# Patient Record
Sex: Female | Born: 1985 | Race: White | Hispanic: No | Smoking: Never smoker
Health system: Southern US, Community
[De-identification: ages and names within clinical notes are randomized; demographics above are authoritative.]

## PROBLEM LIST (undated history)

## (undated) DIAGNOSIS — F32A Depression, unspecified: Secondary | ICD-10-CM

## (undated) DIAGNOSIS — R197 Diarrhea, unspecified: Secondary | ICD-10-CM

## (undated) DIAGNOSIS — F329 Major depressive disorder, single episode, unspecified: Secondary | ICD-10-CM

## (undated) DIAGNOSIS — F419 Anxiety disorder, unspecified: Secondary | ICD-10-CM

## (undated) DIAGNOSIS — E782 Mixed hyperlipidemia: Secondary | ICD-10-CM

## (undated) DIAGNOSIS — J069 Acute upper respiratory infection, unspecified: Secondary | ICD-10-CM

## (undated) DIAGNOSIS — J45909 Unspecified asthma, uncomplicated: Secondary | ICD-10-CM

## (undated) DIAGNOSIS — E041 Nontoxic single thyroid nodule: Secondary | ICD-10-CM

## (undated) HISTORY — DX: Major depressive disorder, single episode, unspecified: F32.9

## (undated) HISTORY — PX: ANKLE SURGERY: SHX546

## (undated) HISTORY — DX: Depression, unspecified: F32.A

## (undated) HISTORY — DX: Nontoxic single thyroid nodule: E04.1

## (undated) HISTORY — DX: Mixed hyperlipidemia: E78.2

## (undated) HISTORY — DX: Acute upper respiratory infection, unspecified: J06.9

## (undated) HISTORY — DX: Anxiety disorder, unspecified: F41.9

## (undated) HISTORY — PX: WISDOM TOOTH EXTRACTION: SHX21

## (undated) HISTORY — PX: SINOSCOPY: SHX187

## (undated) HISTORY — DX: Diarrhea, unspecified: R19.7

---

## 2011-01-06 DIAGNOSIS — F329 Major depressive disorder, single episode, unspecified: Secondary | ICD-10-CM | POA: Insufficient documentation

## 2011-01-06 DIAGNOSIS — F32A Depression, unspecified: Secondary | ICD-10-CM | POA: Insufficient documentation

## 2011-01-06 DIAGNOSIS — E063 Autoimmune thyroiditis: Secondary | ICD-10-CM | POA: Insufficient documentation

## 2011-02-01 DIAGNOSIS — B009 Herpesviral infection, unspecified: Secondary | ICD-10-CM | POA: Insufficient documentation

## 2012-12-19 DIAGNOSIS — G4700 Insomnia, unspecified: Secondary | ICD-10-CM | POA: Insufficient documentation

## 2012-12-19 DIAGNOSIS — K589 Irritable bowel syndrome without diarrhea: Secondary | ICD-10-CM | POA: Insufficient documentation

## 2012-12-19 DIAGNOSIS — Z8659 Personal history of other mental and behavioral disorders: Secondary | ICD-10-CM | POA: Insufficient documentation

## 2014-11-09 ENCOUNTER — Encounter (HOSPITAL_BASED_OUTPATIENT_CLINIC_OR_DEPARTMENT_OTHER): Payer: Self-pay | Admitting: Emergency Medicine

## 2014-11-09 ENCOUNTER — Inpatient Hospital Stay (HOSPITAL_BASED_OUTPATIENT_CLINIC_OR_DEPARTMENT_OTHER)
Admission: EM | Admit: 2014-11-09 | Discharge: 2014-11-12 | DRG: 194 | Disposition: A | Discharge status: Home / Self Care | Payer: Managed Care, Other (non HMO) | Attending: Internal Medicine | Admitting: Internal Medicine

## 2014-11-09 ENCOUNTER — Emergency Department (HOSPITAL_BASED_OUTPATIENT_CLINIC_OR_DEPARTMENT_OTHER): Payer: Managed Care, Other (non HMO)

## 2014-11-09 DIAGNOSIS — F419 Anxiety disorder, unspecified: Secondary | ICD-10-CM | POA: Diagnosis present

## 2014-11-09 DIAGNOSIS — J45901 Unspecified asthma with (acute) exacerbation: Secondary | ICD-10-CM | POA: Diagnosis present

## 2014-11-09 DIAGNOSIS — E039 Hypothyroidism, unspecified: Secondary | ICD-10-CM | POA: Diagnosis present

## 2014-11-09 DIAGNOSIS — Z91018 Allergy to other foods: Secondary | ICD-10-CM

## 2014-11-09 DIAGNOSIS — J45909 Unspecified asthma, uncomplicated: Secondary | ICD-10-CM | POA: Diagnosis present

## 2014-11-09 DIAGNOSIS — J189 Pneumonia, unspecified organism: Principal | ICD-10-CM | POA: Diagnosis present

## 2014-11-09 DIAGNOSIS — K219 Gastro-esophageal reflux disease without esophagitis: Secondary | ICD-10-CM | POA: Diagnosis present

## 2014-11-09 DIAGNOSIS — J329 Chronic sinusitis, unspecified: Secondary | ICD-10-CM | POA: Diagnosis present

## 2014-11-09 DIAGNOSIS — E876 Hypokalemia: Secondary | ICD-10-CM | POA: Diagnosis present

## 2014-11-09 DIAGNOSIS — J45902 Unspecified asthma with status asthmaticus: Secondary | ICD-10-CM | POA: Diagnosis present

## 2014-11-09 DIAGNOSIS — R000 Tachycardia, unspecified: Secondary | ICD-10-CM | POA: Diagnosis present

## 2014-11-09 DIAGNOSIS — F329 Major depressive disorder, single episode, unspecified: Secondary | ICD-10-CM | POA: Diagnosis present

## 2014-11-09 HISTORY — DX: Unspecified asthma, uncomplicated: J45.909

## 2014-11-09 LAB — BASIC METABOLIC PANEL
Anion gap: 10 (ref 5–15)
BUN: 18 mg/dL (ref 6–20)
CALCIUM: 9.1 mg/dL (ref 8.9–10.3)
CO2: 21 mmol/L — ABNORMAL LOW (ref 22–32)
CREATININE: 1.14 mg/dL — AB (ref 0.44–1.00)
Chloride: 109 mmol/L (ref 101–111)
GFR calc Af Amer: >60 mL/min (ref >60)
GLUCOSE: 104 mg/dL — AB (ref 65–99)
Potassium: 3.4 mmol/L — ABNORMAL LOW (ref 3.5–5.1)
Sodium: 140 mmol/L (ref 135–145)

## 2014-11-09 LAB — CBC WITH DIFFERENTIAL/PLATELET
Basophils Absolute: 0.1 10*3/uL (ref 0.0–0.1)
Basophils Relative: 1 % (ref 0–1)
EOS ABS: 1 10*3/uL — AB (ref 0.0–0.7)
EOS PCT: 11 % — AB (ref 0–5)
HCT: 39.3 % (ref 36.0–46.0)
Hemoglobin: 13.1 g/dL (ref 12.0–15.0)
LYMPHS ABS: 2.5 10*3/uL (ref 0.7–4.0)
LYMPHS PCT: 28 % (ref 12–46)
MCH: 29.6 pg (ref 26.0–34.0)
MCHC: 33.3 g/dL (ref 30.0–36.0)
MCV: 88.9 fL (ref 78.0–100.0)
MONO ABS: 0.6 10*3/uL (ref 0.1–1.0)
Monocytes Relative: 6 % (ref 3–12)
Neutro Abs: 5 10*3/uL (ref 1.7–7.7)
Neutrophils Relative %: 54 % (ref 43–77)
PLATELETS: 266 10*3/uL (ref 150–400)
RBC: 4.42 MIL/uL (ref 3.87–5.11)
RDW: 12.9 % (ref 11.5–15.5)
WBC: 9.2 10*3/uL (ref 4.0–10.5)

## 2014-11-09 LAB — D-DIMER, QUANTITATIVE: D-Dimer, Quant: 0.44 ug/mL-FEU (ref 0.00–0.48)

## 2014-11-09 LAB — URINALYSIS, ROUTINE W REFLEX MICROSCOPIC
BILIRUBIN URINE: NEGATIVE
Glucose, UA: NEGATIVE mg/dL
HGB URINE DIPSTICK: NEGATIVE
KETONES UR: 15 mg/dL — AB
Leukocytes, UA: NEGATIVE
Nitrite: NEGATIVE
PH: 6.5 (ref 5.0–8.0)
Protein, ur: NEGATIVE mg/dL
SPECIFIC GRAVITY, URINE: 1.028 (ref 1.005–1.030)
UROBILINOGEN UA: 0.2 mg/dL (ref 0.0–1.0)

## 2014-11-09 LAB — PREGNANCY, URINE: Preg Test, Ur: NEGATIVE

## 2014-11-09 MED ORDER — IPRATROPIUM BROMIDE 0.02 % IN SOLN
0.5000 mg | Freq: Once | RESPIRATORY_TRACT | Status: AC
  Administered 2014-11-09: 0.5 mg via RESPIRATORY_TRACT
  Filled 2014-11-09: qty 2.5

## 2014-11-09 MED ORDER — ALBUTEROL (5 MG/ML) CONTINUOUS INHALATION SOLN
10.0000 mg/h | INHALATION_SOLUTION | RESPIRATORY_TRACT | Status: AC
Start: 2014-11-09 — End: 2014-11-09
  Administered 2014-11-09: 10 mg/h via RESPIRATORY_TRACT
  Filled 2014-11-09: qty 20

## 2014-11-09 MED ORDER — MAGNESIUM SULFATE 2 GM/50ML IV SOLN
2.0000 g | Freq: Once | INTRAVENOUS | Status: AC
  Administered 2014-11-09: 2 g via INTRAVENOUS
  Filled 2014-11-09: qty 50

## 2014-11-09 MED ORDER — PREDNISONE 50 MG PO TABS
60.0000 mg | ORAL_TABLET | Freq: Once | ORAL | Status: AC
  Administered 2014-11-09: 60 mg via ORAL
  Filled 2014-11-09 (×2): qty 1

## 2014-11-09 NOTE — ED Provider Notes (Signed)
CSN: [id number]     Arrival date & time 11/09/14  5465 History  This chart was scribed for Ezequiel Essex, MD by Meriel Pica, ED Scribe. This patient was seen in room MH10/MH10 and the patient's care was started 8:24 PM.   Chief Complaint  Patient presents with  . Cough   HPI HPI Comments: Meagan Chung is a 29 y.o. female who presents to the Emergency Department complaining of gradually worsening, constant SOB onset 3 weeks that progressively worsened 1 day ago. She has been evaluated several times for the same complaint of SOB by her PCP Dr. Jeannine Kitten. At PCP office pt has had chest Xray and was diagnosed with PNA and a sinusitis. Over the course of her symptoms she has been prescribed prednisone, which she has finished, cefdinir, doxycycline, and received a rocephin injection 2 days ago. She reports associated chest tightness, low grade fevers, a productive cough, lower abdominal pain and bilateral calf cramping. Current temp is 98.27F. Pt has used a home nebulizer PRN that has provided mild, brief relief of SOB. She has a PMhx of asthma but does not use a breathing treatment daily at baseline but takes dulera daily. She reports use of the nebulizer treatment 5 times in the past hour due to worsening SOB. She notes she has had improvement of asthma symptoms in the past with prednisone, however recent prednisone course did not provide relief. Pt traveled to [place] several months ago. Denies CP, dysuria, hematuria, taking BC medication, or chance of pregnancy.   Past Medical History  Diagnosis Date  . Asthma    Past Surgical History  Procedure Laterality Date  . Ankle surgery     History reviewed. No pertinent family history. Social History  Substance Use Topics  . Smoking status: Never Smoker   . Smokeless tobacco: None  . Alcohol Use: Yes     Comment: rarely   OB History    No data available     Review of Systems  Constitutional: Positive for fatigue.  Respiratory: Positive for  cough, chest tightness and shortness of breath.   Cardiovascular: Negative for chest pain.  Genitourinary: Negative for dysuria and hematuria.  A complete 10 system review of systems was obtained and is otherwise negative except at noted in the HPI and PMH.  Allergies  Review of patient's allergies indicates no known allergies.  Home Medications   Prior to Admission medications   Not on File   BP 119/83 mmHg  Pulse 107  Temp(Src) 98.4 F (36.9 C) (Oral)  Resp 26  SpO2 94%  LMP 10/02/2014 Physical Exam  Constitutional: She is oriented to person, place, and time. She appears well-developed and well-nourished. No distress.  HENT:  Head: Normocephalic and atraumatic.  Mouth/Throat: Oropharynx is clear and moist. No oropharyngeal exudate.  Eyes: Conjunctivae and EOM are normal. Pupils are equal, round, and reactive to light.  Neck: Normal range of motion. Neck supple.  No meningismus.  Cardiovascular: Regular rhythm, normal heart sounds and intact distal pulses.   No murmur heard. Tachycardic.   Pulmonary/Chest: She has wheezes.  Tachypneic; good airflow with faint expiratory wheezing   Abdominal: Soft. There is no tenderness. There is no rebound and no guarding.  Musculoskeletal: Normal range of motion. She exhibits no edema or tenderness.  Neurological: She is alert and oriented to person, place, and time. No cranial nerve deficit. She exhibits normal muscle tone. Coordination normal.  No ataxia on finger to nose bilaterally. No pronator drift. 5/5 strength throughout. CN  2-12 intact. Negative Romberg. Equal grip strength. Sensation intact. Gait is normal.   Skin: Skin is warm.  Psychiatric: She has a normal mood and affect. Her behavior is normal.  Nursing note and vitals reviewed.   ED Course  Procedures  DIAGNOSTIC STUDIES: Oxygen Saturation is 94% on mask, adequate by my interpretation.    COORDINATION OF CARE: 8:32 PM Discussed treatment plan with pt. Chest Xray  ordered. Pt acknowledges and agrees to plan.   Labs Review Labs Reviewed  URINALYSIS, ROUTINE W REFLEX MICROSCOPIC (NOT AT [facility]) - Abnormal; Notable for the following:    Color, Urine AMBER (*)    Ketones, ur 15 (*)    All other components within normal limits  CBC WITH DIFFERENTIAL/PLATELET - Abnormal; Notable for the following:    Eosinophils Relative 11 (*)    Eosinophils Absolute 1.0 (*)    All other components within normal limits  BASIC METABOLIC PANEL - Abnormal; Notable for the following:    Potassium 3.4 (*)    CO2 21 (*)    Glucose, Bld 104 (*)    Creatinine, Ser 1.14 (*)    All other components within normal limits  PREGNANCY, URINE  D-DIMER, QUANTITATIVE (NOT AT [facility])    Imaging Review Dg Chest 2 View  11/09/2014   CLINICAL DATA:  Cough. Pneumonia for several days. Shortness of breath.  EXAM: CHEST  2 VIEW  COMPARISON:  None.  FINDINGS: Cardiomediastinal silhouette is within normal limits. There is moderate central airway thickening with mildly asymmetric right infrahilar opacity. No pleural effusion or pneumothorax is seen. No acute osseous abnormality is identified.  IMPRESSION: Airway thickening which may reflect bronchitis or reactive airways disease. Slightly asymmetric right infrahilar opacity, with developing pneumonia in this region not excluded.   Electronically Signed   By: Logan Bores M.D.   On: 11/09/2014 20:46   I have personally reviewed and evaluated these images and lab results as part of my medical decision-making.   EKG Interpretation None      MDM   Final diagnoses:  Status asthmaticus, unspecified asthma severity   3 weeks of difficulty breathing, wheezing, shortness of breath. Treated by his PCP for pneumonia and sinusitis. Currently on doxycycline and Cefdinir.  Speaking in full sentences. No hypoxia. Scattered expiratory wheezing.  Nebulizers, steroids, chest x-ray.  Chest x-ray shows possible developing pneumonia in the right side. She  is already on antibiotics. D-dimer is negative. Rocephin and azithromycin given.  MOre wheezing after neb, tachypneic.  Not hypoxic with ambulation.   Remains dyspneic with conversation.  Tachycardic, likely from albuterol. D-dimer negative. Admission d/w Dr. Alcario Drought.  CRITICAL CARE Performed by: Ezequiel Essex Total critical care time: 35 Critical care time was exclusive of separately billable procedures and treating other patients. Critical care was necessary to treat or prevent imminent or life-threatening deterioration. Critical care was time spent personally by me on the following activities: development of treatment plan with patient and/or surrogate as well as nursing, discussions with consultants, evaluation of patient's response to treatment, examination of patient, obtaining history from patient or surrogate, ordering and performing treatments and interventions, ordering and review of laboratory studies, ordering and review of radiographic studies, pulse oximetry and re-evaluation of patient's condition.   I personally performed the services described in this documentation, which was scribed in my presence. The recorded information has been reviewed and is accurate.   Ezequiel Essex, MD 11/10/14 [phone]

## 2014-11-09 NOTE — ED Notes (Signed)
Patient reports that she is very SOB and has had to use breathing machine several times in the last few hours and can not catch her breath.  The patient has been dx with PNA and sinus infection.

## 2014-11-09 NOTE — ED Notes (Signed)
Ambulated patient per MD orders. Patient maintained saturations of 97-98% with HR 147. Patient able to walk with steady gait without difficulty.

## 2014-11-09 NOTE — ED Notes (Addendum)
Visual assessment - patient able to converse with no to minimal difficulty, speaking in sentences without pausing to catch her breath.  States h/o asthma and has been sob off and on for 3 weeks, with minimal relief from nebulizers at home.

## 2014-11-10 DIAGNOSIS — F329 Major depressive disorder, single episode, unspecified: Secondary | ICD-10-CM | POA: Diagnosis present

## 2014-11-10 DIAGNOSIS — J189 Pneumonia, unspecified organism: Secondary | ICD-10-CM | POA: Diagnosis present

## 2014-11-10 DIAGNOSIS — F419 Anxiety disorder, unspecified: Secondary | ICD-10-CM | POA: Diagnosis present

## 2014-11-10 DIAGNOSIS — E876 Hypokalemia: Secondary | ICD-10-CM | POA: Diagnosis present

## 2014-11-10 DIAGNOSIS — J45909 Unspecified asthma, uncomplicated: Secondary | ICD-10-CM | POA: Diagnosis present

## 2014-11-10 DIAGNOSIS — J45901 Unspecified asthma with (acute) exacerbation: Secondary | ICD-10-CM | POA: Diagnosis present

## 2014-11-10 DIAGNOSIS — J329 Chronic sinusitis, unspecified: Secondary | ICD-10-CM | POA: Diagnosis present

## 2014-11-10 DIAGNOSIS — E039 Hypothyroidism, unspecified: Secondary | ICD-10-CM | POA: Diagnosis present

## 2014-11-10 DIAGNOSIS — R000 Tachycardia, unspecified: Secondary | ICD-10-CM | POA: Diagnosis present

## 2014-11-10 DIAGNOSIS — Z91018 Allergy to other foods: Secondary | ICD-10-CM | POA: Diagnosis not present

## 2014-11-10 DIAGNOSIS — K219 Gastro-esophageal reflux disease without esophagitis: Secondary | ICD-10-CM | POA: Diagnosis present

## 2014-11-10 DIAGNOSIS — J45902 Unspecified asthma with status asthmaticus: Secondary | ICD-10-CM | POA: Diagnosis present

## 2014-11-10 LAB — BASIC METABOLIC PANEL
Anion gap: 9 (ref 5–15)
BUN: 9 mg/dL (ref 6–20)
CALCIUM: 9 mg/dL (ref 8.9–10.3)
CO2: 19 mmol/L — AB (ref 22–32)
CREATININE: 0.61 mg/dL (ref 0.44–1.00)
Chloride: 107 mmol/L (ref 101–111)
GFR calc non Af Amer: >60 mL/min (ref >60)
Glucose, Bld: 167 mg/dL — ABNORMAL HIGH (ref 65–99)
Potassium: 4 mmol/L (ref 3.5–5.1)
Sodium: 135 mmol/L (ref 135–145)

## 2014-11-10 LAB — EXPECTORATED SPUTUM ASSESSMENT W REFEX TO RESP CULTURE

## 2014-11-10 LAB — CBC
HCT: 38.9 % (ref 36.0–46.0)
Hemoglobin: 12.9 g/dL (ref 12.0–15.0)
MCH: 29.4 pg (ref 26.0–34.0)
MCHC: 33.2 g/dL (ref 30.0–36.0)
MCV: 88.6 fL (ref 78.0–100.0)
PLATELETS: 267 10*3/uL (ref 150–400)
RBC: 4.39 MIL/uL (ref 3.87–5.11)
RDW: 13.1 % (ref 11.5–15.5)
WBC: 7.6 10*3/uL (ref 4.0–10.5)

## 2014-11-10 LAB — TSH: TSH: 0.313 u[IU]/mL — ABNORMAL LOW (ref 0.350–4.500)

## 2014-11-10 LAB — STREP PNEUMONIAE URINARY ANTIGEN: STREP PNEUMO URINARY ANTIGEN: NEGATIVE

## 2014-11-10 LAB — EXPECTORATED SPUTUM ASSESSMENT W GRAM STAIN, RFLX TO RESP C

## 2014-11-10 LAB — MAGNESIUM: Magnesium: 2.2 mg/dL (ref 1.7–2.4)

## 2014-11-10 LAB — MRSA PCR SCREENING: MRSA BY PCR: NEGATIVE

## 2014-11-10 MED ORDER — METHYLPREDNISOLONE SODIUM SUCC 125 MG IJ SOLR
60.0000 mg | Freq: Two times a day (BID) | INTRAMUSCULAR | Status: DC
  Administered 2014-11-10: 60 mg via INTRAVENOUS
  Filled 2014-11-10: qty 2

## 2014-11-10 MED ORDER — LEVALBUTEROL HCL 0.63 MG/3ML IN NEBU: 0.6300 mg | INHALATION_SOLUTION | RESPIRATORY_TRACT | Status: DC | PRN

## 2014-11-10 MED ORDER — IPRATROPIUM BROMIDE 0.02 % IN SOLN: 0.5000 mg | RESPIRATORY_TRACT | Status: DC | PRN

## 2014-11-10 MED ORDER — METHYLPREDNISOLONE SODIUM SUCC 125 MG IJ SOLR
60.0000 mg | Freq: Once | INTRAMUSCULAR | Status: AC
  Administered 2014-11-10: 60 mg via INTRAVENOUS
  Filled 2014-11-10: qty 2

## 2014-11-10 MED ORDER — ALBUTEROL SULFATE (2.5 MG/3ML) 0.083% IN NEBU
2.5000 mg | INHALATION_SOLUTION | RESPIRATORY_TRACT | Status: DC | PRN
  Administered 2014-11-10: 2.5 mg via RESPIRATORY_TRACT
  Filled 2014-11-10: qty 3

## 2014-11-10 MED ORDER — METHYLPREDNISOLONE SODIUM SUCC 125 MG IJ SOLR
60.0000 mg | Freq: Three times a day (TID) | INTRAMUSCULAR | Status: DC
  Administered 2014-11-10 – 2014-11-12 (×6): 60 mg via INTRAVENOUS
  Filled 2014-11-10 (×6): qty 2

## 2014-11-10 MED ORDER — ONDANSETRON HCL 4 MG PO TABS: 4.0000 mg | ORAL_TABLET | Freq: Four times a day (QID) | ORAL | Status: DC | PRN

## 2014-11-10 MED ORDER — ACETAMINOPHEN 650 MG RE SUPP: 650.0000 mg | Freq: Four times a day (QID) | RECTAL | Status: DC | PRN

## 2014-11-10 MED ORDER — DEXTROSE 5 % IV SOLN
1.0000 g | Freq: Once | INTRAVENOUS | Status: AC
  Administered 2014-11-10: 1 g via INTRAVENOUS

## 2014-11-10 MED ORDER — DEXTROSE 5 % IV SOLN
1.0000 g | INTRAVENOUS | Status: DC
  Administered 2014-11-10: 1 g via INTRAVENOUS
  Filled 2014-11-10 (×2): qty 10

## 2014-11-10 MED ORDER — THYROID 48.75 MG PO TABS
48.7500 mg | ORAL_TABLET | Freq: Two times a day (BID) | ORAL | Status: DC
  Filled 2014-11-10 (×3): qty 1

## 2014-11-10 MED ORDER — CLONAZEPAM 0.5 MG PO TABS
0.5000 mg | ORAL_TABLET | Freq: Every day | ORAL | Status: DC
  Administered 2014-11-10 – 2014-11-11 (×2): 1 mg via ORAL
  Filled 2014-11-10 (×2): qty 2

## 2014-11-10 MED ORDER — ENOXAPARIN SODIUM 40 MG/0.4ML ~~LOC~~ SOLN
40.0000 mg | SUBCUTANEOUS | Status: DC
  Administered 2014-11-10 – 2014-11-11 (×2): 40 mg via SUBCUTANEOUS
  Filled 2014-11-10 (×2): qty 0.4

## 2014-11-10 MED ORDER — POLYETHYLENE GLYCOL 3350 17 G PO PACK: 17.0000 g | PACK | Freq: Every day | ORAL | Status: DC | PRN

## 2014-11-10 MED ORDER — CEFTRIAXONE SODIUM 1 G IJ SOLR
INTRAMUSCULAR | Status: AC
  Filled 2014-11-10: qty 10

## 2014-11-10 MED ORDER — AZITHROMYCIN 500 MG IV SOLR
INTRAVENOUS | Status: AC
  Filled 2014-11-10: qty 500

## 2014-11-10 MED ORDER — SODIUM CHLORIDE 0.9 % IJ SOLN
3.0000 mL | Freq: Two times a day (BID) | INTRAMUSCULAR | Status: DC
  Administered 2014-11-10 – 2014-11-11 (×3): 3 mL via INTRAVENOUS

## 2014-11-10 MED ORDER — ACETAMINOPHEN 325 MG PO TABS
650.0000 mg | ORAL_TABLET | Freq: Four times a day (QID) | ORAL | Status: DC | PRN
  Administered 2014-11-11: 650 mg via ORAL
  Filled 2014-11-10: qty 2

## 2014-11-10 MED ORDER — MOMETASONE FURO-FORMOTEROL FUM 200-5 MCG/ACT IN AERO
1.0000 | INHALATION_SPRAY | Freq: Two times a day (BID) | RESPIRATORY_TRACT | Status: DC
  Administered 2014-11-10 – 2014-11-11 (×3): 1 via RESPIRATORY_TRACT
  Filled 2014-11-10: qty 8.8

## 2014-11-10 MED ORDER — SODIUM CHLORIDE 0.9 % IV SOLN
INTRAVENOUS | Status: DC
  Administered 2014-11-10: 01:00:00 via INTRAVENOUS

## 2014-11-10 MED ORDER — CITALOPRAM HYDROBROMIDE 20 MG PO TABS
40.0000 mg | ORAL_TABLET | Freq: Every day | ORAL | Status: DC
  Administered 2014-11-10 – 2014-11-11 (×2): 40 mg via ORAL
  Filled 2014-11-10 (×2): qty 2

## 2014-11-10 MED ORDER — IPRATROPIUM-ALBUTEROL 0.5-2.5 (3) MG/3ML IN SOLN: 3.0000 mL | RESPIRATORY_TRACT | Status: DC | PRN

## 2014-11-10 MED ORDER — POTASSIUM CHLORIDE CRYS ER 20 MEQ PO TBCR
40.0000 meq | EXTENDED_RELEASE_TABLET | Freq: Once | ORAL | Status: AC
  Administered 2014-11-10: 40 meq via ORAL
  Filled 2014-11-10: qty 2

## 2014-11-10 MED ORDER — LEVALBUTEROL HCL 0.63 MG/3ML IN NEBU
0.6300 mg | INHALATION_SOLUTION | Freq: Four times a day (QID) | RESPIRATORY_TRACT | Status: DC
  Administered 2014-11-10 – 2014-11-12 (×7): 0.63 mg via RESPIRATORY_TRACT
  Filled 2014-11-10 (×8): qty 3

## 2014-11-10 MED ORDER — CLONAZEPAM 0.5 MG PO TABS: 0.5000 mg | ORAL_TABLET | Freq: Every day | ORAL | Status: DC | PRN

## 2014-11-10 MED ORDER — ONDANSETRON HCL 4 MG/2ML IJ SOLN: 4.0000 mg | Freq: Four times a day (QID) | INTRAMUSCULAR | Status: DC | PRN

## 2014-11-10 MED ORDER — PANTOPRAZOLE SODIUM 40 MG PO TBEC
40.0000 mg | DELAYED_RELEASE_TABLET | Freq: Every day | ORAL | Status: DC
  Administered 2014-11-10 – 2014-11-11 (×2): 40 mg via ORAL
  Filled 2014-11-10 (×2): qty 1

## 2014-11-10 MED ORDER — SODIUM CHLORIDE 0.9 % IV SOLN
INTRAVENOUS | Status: DC
  Administered 2014-11-10: 100 mL/h via INTRAVENOUS
  Administered 2014-11-10: 22:00:00 via INTRAVENOUS
  Administered 2014-11-10: 100 mL/h via INTRAVENOUS
  Administered 2014-11-11: 08:00:00 via INTRAVENOUS

## 2014-11-10 MED ORDER — IPRATROPIUM-ALBUTEROL 0.5-2.5 (3) MG/3ML IN SOLN
3.0000 mL | Freq: Four times a day (QID) | RESPIRATORY_TRACT | Status: DC
  Administered 2014-11-10: 3 mL via RESPIRATORY_TRACT
  Filled 2014-11-10: qty 3

## 2014-11-10 MED ORDER — LAMOTRIGINE 150 MG PO TABS
300.0000 mg | ORAL_TABLET | Freq: Every day | ORAL | Status: DC
  Administered 2014-11-10 – 2014-11-11 (×2): 300 mg via ORAL
  Filled 2014-11-10 (×5): qty 2

## 2014-11-10 MED ORDER — ALUM & MAG HYDROXIDE-SIMETH 200-200-20 MG/5ML PO SUSP: 30.0000 mL | Freq: Four times a day (QID) | ORAL | Status: DC | PRN

## 2014-11-10 MED ORDER — DEXTROMETHORPHAN POLISTIREX ER 30 MG/5ML PO SUER
30.0000 mg | Freq: Two times a day (BID) | ORAL | Status: DC | PRN
  Filled 2014-11-10: qty 5

## 2014-11-10 MED ORDER — GUAIFENESIN ER 600 MG PO TB12
1200.0000 mg | ORAL_TABLET | Freq: Two times a day (BID) | ORAL | Status: DC
Start: 2014-11-10 — End: 2014-11-12
  Administered 2014-11-10 – 2014-11-11 (×4): 1200 mg via ORAL
  Filled 2014-11-10 (×4): qty 2

## 2014-11-10 MED ORDER — DEXTROSE 5 % IV SOLN
500.0000 mg | INTRAVENOUS | Status: DC
  Administered 2014-11-10: 500 mg via INTRAVENOUS
  Filled 2014-11-10 (×3): qty 500

## 2014-11-10 MED ORDER — SODIUM CHLORIDE 0.9 % IV SOLN
INTRAVENOUS | Status: DC
  Administered 2014-11-10: 15:00:00 via INTRAVENOUS

## 2014-11-10 MED ORDER — INSULIN ASPART 100 UNIT/ML ~~LOC~~ SOLN
0.0000 [IU] | Freq: Three times a day (TID) | SUBCUTANEOUS | Status: DC
  Administered 2014-11-11 (×3): 1 [IU] via SUBCUTANEOUS

## 2014-11-10 MED ORDER — IPRATROPIUM BROMIDE 0.02 % IN SOLN
0.5000 mg | Freq: Four times a day (QID) | RESPIRATORY_TRACT | Status: DC
  Administered 2014-11-10 – 2014-11-12 (×7): 0.5 mg via RESPIRATORY_TRACT
  Filled 2014-11-10 (×8): qty 2.5

## 2014-11-10 MED ORDER — DEXTROSE 5 % IV SOLN
500.0000 mg | Freq: Once | INTRAVENOUS | Status: AC
  Administered 2014-11-10: 500 mg via INTRAVENOUS

## 2014-11-10 NOTE — Progress Notes (Signed)
Utilization Review Completed.Meagan Chung T9/12/2014  

## 2014-11-10 NOTE — ED Notes (Signed)
[facility] here to transfer to hospital

## 2014-11-10 NOTE — H&P (Signed)
[facility] History and Physical                                                                                    Meagan Chung, is a 29 y.o. female  MRN: [id number]   DOB - 11-16-1985  Admit Date - 11/09/2014  Outpatient Primary MD for the patient is Zane Herald, MD  Pulmonologist:  Soledad Gerlach, [facility], Pulmonology  Referring Physician:  Dr. Melanee Left  Chief Complaint:   Chief Complaint  Patient presents with  . Cough     HPI  Meagan Chung  is a 29 y.o. female, with asthma who states that she has been sick every 4 weeks since April.  She presented to [facility] last evening with difficulty breathing and paroxysmal cough and was found to have right sided pneumonia on xray.  The patient has been having cough and progressive SOB for 3 weeks.  2 weeks ago she was started on doxy and prednisone.  This past Thursday 9/8, cefdinir was added and she received a shot of rocephin IM as well.  She regularly uses nebs, usually with good response.  Yesterday she used her nebulizer machine 6 times and had no improvement.  After treatment at [facility] her cough is becoming productive and she is bringing up yellow / green sputum.  In the ER - no hypoxia, D-Dimer is .44.  Tachycardic 110 - 139.  Potassium slightly low 3.4.  CO2 21.  Review of Systems   In addition to the HPI above,  No Fever-chills, No Headache, No changes with Vision or hearing, No problems swallowing food or Liquids, No Abdominal pain, No Nausea or Vomiting, Bowel movements are regular, No Blood in stool or Urine, No dysuria, No new skin rashes or bruises, No new joints pains-aches,  No new weakness, tingling, numbness in any extremity, No recent weight gain or loss, A full 10 point Review of Systems was done, except as stated above, all other Review of Systems were negative.  Past Medical History  Past Medical History  Diagnosis Date  . Asthma     Past Surgical History  Procedure Laterality Date  . Ankle  surgery      Social History Social History  Substance Use Topics  . Smoking status: Never Smoker   . Smokeless tobacco: Not on file  . 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 She is active and independent with her ADLs.  Family History There is no family history of asthma. Her paternal grandfather had lung cancer but was also a heavy smoker.  Prior to Admission medications   Not on File    No Known Allergies  Physical Exam  Vitals  Blood pressure 114/59, pulse 109, temperature 98.3 F (36.8 C), temperature source Oral, resp. rate 16, height 5\' 6"  (1.676 m), weight 84.369 kg (186 lb), last menstrual period 10/02/2014, SpO2 97 %.   General: Well-developed, well-nourished female lying in bed in NAD  Psych:  Normal affect and insight, Not Suicidal or Homicidal, Awake Alert, Oriented X 3.  Neuro:   No F.N deficits, ALL C.Nerves Intact, Strength 5/5  all 4 extremities, Sensation intact all 4 extremities.  ENT:  Ears and Eyes appear Normal, Conjunctivae clear, PER. Moist oral mucosa without erythema or exudates.  Neck:  Supple, No lymphadenopathy appreciated  Respiratory:  Symmetrical chest wall movement, Good air movement bilaterally, right sided   Cardiac:  RRR, No Murmurs, no LE edema noted, no JVD.    Abdomen:  Positive bowel sounds, Soft, Non tender, Non distended,  No masses appreciated  Skin:  No Cyanosis, Normal Skin Turgor, No Skin Rash or Bruise.  Extremities:  Able to move all 4. 5/5 strength in each,  no effusions.  Data Review  CBC  Recent Labs Lab 11/09/14 2046  WBC 9.2  HGB 13.1  HCT 39.3  PLT 266  MCV 88.9  MCH 29.6  MCHC 33.3  RDW 12.9  LYMPHSABS 2.5  MONOABS 0.6  EOSABS 1.0*  BASOSABS 0.1    Chemistries   Recent Labs Lab 11/09/14 2046  NA 140  K 3.4*  CL 109  CO2 21*  GLUCOSE 104*  BUN 18  CREATININE 1.14*  CALCIUM 9.1      Recent Labs  11/09/14 2046  DDIMER 0.44     Urinalysis    Component Value Date/Time   COLORURINE AMBER* 11/09/2014 2100   APPEARANCEUR CLEAR 11/09/2014 2100   LABSPEC 1.028 11/09/2014 2100   PHURINE 6.5 11/09/2014 2100   GLUCOSEU NEGATIVE 11/09/2014 2100   HGBUR NEGATIVE 11/09/2014 2100   BILIRUBINUR NEGATIVE 11/09/2014 2100   KETONESUR 15* 11/09/2014 2100   PROTEINUR NEGATIVE 11/09/2014 2100   UROBILINOGEN 0.2 11/09/2014 2100   NITRITE NEGATIVE 11/09/2014 2100   LEUKOCYTESUR NEGATIVE 11/09/2014 2100    Imaging results:   Dg Chest 2 View  11/09/2014   CLINICAL DATA:  Cough. Pneumonia for several days. Shortness of breath.  EXAM: CHEST  2 VIEW  COMPARISON:  None.  FINDINGS: Cardiomediastinal silhouette is within normal limits. There is moderate central airway thickening with mildly asymmetric right infrahilar opacity. No pleural effusion or pneumothorax is seen. No acute osseous abnormality is identified.  IMPRESSION: Airway thickening which may reflect bronchitis or reactive airways disease. Slightly asymmetric right infrahilar opacity, with developing pneumonia in this region not excluded.   Electronically Signed   By: Logan Bores M.D.   On: 11/09/2014 20:46    My personal review of EKG: NSR, No ST changes noted.   Assessment & Plan  Principal Problem:   CAP (community acquired pneumonia) Active Problems:   Status asthmaticus   Anxiety   Community-acquired pneumonia Failed outpatient treatment with doxycycline and Cefdinir We'll place on azithromycin/Rocephin. Add guaifenesin and twice a day when necessary Delsym Patient is not hypoxic.  Continue on gentle IV fluids at 100 mL per hour.  Asthma exacerbation Patient reports recurrent exacerbations since April. She needs to follow more closely with her pulmonologist. Will treat with scheduled and when necessary nebulizers. Solu-Medrol. Incentive spirometry.  She received magnesium sulfate in the emergency department.  Continue her home medication-Dulera.  Sinus  tach Secondary to acute illness and steroids.  Patient also has a history of hypothyroidism. Will check TSH. This far patient's pulse rate has improved with IV fluids.  DDimer was .44.    Hypothyroidism Continue thyroid supplementation. (Nature-Throid)  Anxiety Continue Klonopin and Lamictal, and Celexa daily at bedtime. Added 1 dose of Klonopin when necessary here in the hospital.   Consultants Called:  None  Family Communication:   None patient is alert and oriented  Code Status:  Full code  Condition:  Stable  Potential Disposition: To home in 24-48 hours.  Time spent in minutes : [place],  PA-C on 11/10/2014 at 8:52 AM Between 7am to 7pm - Pager - [phone] After 7pm go to [email or web address] - password [email or web address] And look for the night coverage person covering me after hours  [facility]

## 2014-11-11 DIAGNOSIS — J45902 Unspecified asthma with status asthmaticus: Secondary | ICD-10-CM

## 2014-11-11 DIAGNOSIS — E876 Hypokalemia: Secondary | ICD-10-CM

## 2014-11-11 DIAGNOSIS — F419 Anxiety disorder, unspecified: Secondary | ICD-10-CM

## 2014-11-11 DIAGNOSIS — J189 Pneumonia, unspecified organism: Principal | ICD-10-CM

## 2014-11-11 LAB — GLUCOSE, CAPILLARY
GLUCOSE-CAPILLARY: 174 mg/dL — AB (ref 65–99)
GLUCOSE-CAPILLARY: 136 mg/dL — AB (ref 65–99)
GLUCOSE-CAPILLARY: 136 mg/dL — AB (ref 65–99)
Glucose-Capillary: 133 mg/dL — ABNORMAL HIGH (ref 65–99)
Glucose-Capillary: 135 mg/dL — ABNORMAL HIGH (ref 65–99)

## 2014-11-11 LAB — LEGIONELLA ANTIGEN, URINE

## 2014-11-11 LAB — BASIC METABOLIC PANEL
Anion gap: 7 (ref 5–15)
BUN: 8 mg/dL (ref 6–20)
CALCIUM: 9.3 mg/dL (ref 8.9–10.3)
CHLORIDE: 109 mmol/L (ref 101–111)
CO2: 22 mmol/L (ref 22–32)
CREATININE: 0.76 mg/dL (ref 0.44–1.00)
GFR calc non Af Amer: >60 mL/min (ref >60)
Glucose, Bld: 143 mg/dL — ABNORMAL HIGH (ref 65–99)
Potassium: 3.6 mmol/L (ref 3.5–5.1)
SODIUM: 138 mmol/L (ref 135–145)

## 2014-11-11 LAB — CBC
HCT: 40.3 % (ref 36.0–46.0)
Hemoglobin: 13.1 g/dL (ref 12.0–15.0)
MCH: 29.2 pg (ref 26.0–34.0)
MCHC: 32.5 g/dL (ref 30.0–36.0)
MCV: 90 fL (ref 78.0–100.0)
PLATELETS: 287 10*3/uL (ref 150–400)
RBC: 4.48 MIL/uL (ref 3.87–5.11)
RDW: 13.3 % (ref 11.5–15.5)
WBC: 16 10*3/uL — AB (ref 4.0–10.5)

## 2014-11-11 LAB — HIV ANTIBODY (ROUTINE TESTING W REFLEX): HIV SCREEN 4TH GENERATION: NONREACTIVE

## 2014-11-11 MED ORDER — AMOXICILLIN-POT CLAVULANATE 875-125 MG PO TABS
1.0000 | ORAL_TABLET | Freq: Two times a day (BID) | ORAL | Status: DC
  Administered 2014-11-11 (×2): 1 via ORAL
  Filled 2014-11-11 (×4): qty 1

## 2014-11-11 MED ORDER — LORATADINE 10 MG PO TABS
10.0000 mg | ORAL_TABLET | Freq: Every day | ORAL | Status: DC
  Administered 2014-11-11: 10 mg via ORAL
  Filled 2014-11-11: qty 1

## 2014-11-11 NOTE — Progress Notes (Signed)
Nutrition Brief Note  Patient identified on the Malnutrition Screening Tool (MST) Report  Wt Readings from Last 15 Encounters:  11/10/14 186 lb (84.369 kg)    Body mass index is 30.04 kg/(m^2). Patient meets criteria for Obesity based on current BMI. Pt states that she has lost 12 lbs in the past month due to poor appetite and occasional nausea. She states she is feeling better, no more nausea, and has been eating better the past couple days. She feels that she will continue to eat well and is not interested in any interventions at this time. Pt appears well-nourished.   Current diet order is Regular, patient is consuming approximately 75-100% of meals at this time. Labs and medications reviewed.   No nutrition interventions warranted at this time. If nutrition issues arise, please consult RD.   Scarlette Ar RD, LDN Inpatient Clinical Dietitian Pager: [phone] After Hours Pager: [phone]

## 2014-11-11 NOTE — Progress Notes (Signed)
[facility] PROGRESS NOTE  Meagan Chung IWL:[id number] DOB: 09/19/1985 DOA: 11/09/2014 PCP: Zane Herald, MD  Assessment/Plan: 1-CAP: no fever and with improvement in her breathing -will transfer to med-surg bed -transition abx's to PO -will continue supportive care -most likely home in 1-2 days  2-asthma exacerbation: -will need outpatient follow up to have her long term therapy reviewed and most likely to repeat PFT's -will continue steroids tapering, continue inhalers/nebulizer treatment and follow response -will also continue PPI for GI contraction and potential GERD  3-hypokalemia: -will replete as needed -will resume daily magnesium intake  4-depression/anxiety: will continue celexa and PRN klonopin   5-hypothyroidism: will continue thyroid supplementation  6-sinusitis: will resume claritin     Code Status: Full Family Communication: no family at bedside  Disposition Plan: will transfer out of stepdown; transition abx's to PO and monitor breathing; hopefully home in 1-2 days   Consultants:  None   Procedures:  See below for x-ray reports   Antibiotics:  Rocephin Zithromax 9/11--9/12  Augmentin (9/12)  HPI/Subjective: Afebrile, still with some wheezing, but able to speak in full sentences and no complaining of CP. Positive coughing spells   Objective: Filed Vitals:   11/11/14 0723  BP: 117/73  Pulse:   Temp: 98 F (36.7 C)  Resp:     Intake/Output Summary (Last 24 hours) at 11/11/14 0904 Last data filed at 11/11/14 0800  Gross per 24 hour  Intake   3300 ml  Output      0 ml  Net   3300 ml   Filed Weights   11/10/14 0526  Weight: 84.369 kg (186 lb)    Exam:   General:  Afebrile, no CP, reports significant improvement in her breathing  Cardiovascular: S1 and S2, no rubs or gallops  Respiratory: very mild wheezing, scattered rhonchi, no crackles  Abdomen: soft, NT, ND, positive BS  Musculoskeletal: no edema, no cyanosis, no  clubbing   Data Reviewed: Basic Metabolic Panel:  Recent Labs Lab 11/09/14 2046 11/10/14 1300 11/11/14 0225  NA 140 135 138  K 3.4* 4.0 3.6  CL 109 107 109  CO2 21* 19* 22  GLUCOSE 104* 167* 143*  BUN 18 9 8   CREATININE 1.14* 0.61 0.76  CALCIUM 9.1 9.0 9.3  MG  --  2.2  --    CBC:  Recent Labs Lab 11/09/14 2046 11/10/14 1300 11/11/14 0225  WBC 9.2 7.6 16.0*  NEUTROABS 5.0  --   --   HGB 13.1 12.9 13.1  HCT 39.3 38.9 40.3  MCV 88.9 88.6 90.0  PLT 266 267 287   CBG:  Recent Labs Lab 11/10/14 2245 11/11/14 0726  GLUCAP 174* 136*    Recent Results (from the past 240 hour(s))  MRSA PCR Screening     Status: None   Collection Time: 11/10/14  6:47 AM  Result Value Ref Range Status   MRSA by PCR NEGATIVE NEGATIVE Final    Comment:        The GeneXpert MRSA Assay (FDA approved for NASAL specimens only), is one component of a comprehensive MRSA colonization surveillance program. It is not intended to diagnose MRSA infection nor to guide or monitor treatment for MRSA infections.   Culture, sputum-assessment     Status: None   Collection Time: 11/10/14 10:52 AM  Result Value Ref Range Status   Specimen Description SPUTUM  Final   Special Requests Immunocompromised  Final   Sputum evaluation   Final    MICROSCOPIC FINDINGS SUGGEST THAT THIS SPECIMEN  IS NOT REPRESENTATIVE OF LOWER RESPIRATORY SECRETIONS. PLEASE RECOLLECT. Gram Stain Report Called to,Read Back By and Verified With: L. Kenton Kingfisher, RN AT 1206 ON [id number] BY Rhea Bleacher    Report Status 11/10/2014 FINAL  Final     Studies: Dg Chest 2 View  11/09/2014   CLINICAL DATA:  Cough. Pneumonia for several days. Shortness of breath.  EXAM: CHEST  2 VIEW  COMPARISON:  None.  FINDINGS: Cardiomediastinal silhouette is within normal limits. There is moderate central airway thickening with mildly asymmetric right infrahilar opacity. No pleural effusion or pneumothorax is seen. No acute osseous abnormality is  identified.  IMPRESSION: Airway thickening which may reflect bronchitis or reactive airways disease. Slightly asymmetric right infrahilar opacity, with developing pneumonia in this region not excluded.   Electronically Signed   By: Logan Bores M.D.   On: 11/09/2014 20:46    Scheduled Meds: . amoxicillin-clavulanate  1 tablet Oral Q12H  . citalopram  40 mg Oral QHS  . clonazePAM  0.5-1 mg Oral QHS  . enoxaparin (LOVENOX) injection  40 mg Subcutaneous Q24H  . guaiFENesin  1,200 mg Oral BID  . insulin aspart  0-9 Units Subcutaneous TID WC  . ipratropium  0.5 mg Nebulization Q6H  . lamoTRIgine  300 mg Oral QHS  . levalbuterol  0.63 mg Nebulization Q6H  . loratadine  10 mg Oral Daily  . methylPREDNISolone (SOLU-MEDROL) injection  60 mg Intravenous 3 times per day  . mometasone-formoterol  1 puff Inhalation BID  . pantoprazole  40 mg Oral Daily  . sodium chloride  3 mL Intravenous Q12H  . Thyroid  48.75 mg Oral BID   Continuous Infusions:   Principal Problem:   CAP (community acquired pneumonia) Active Problems:   Status asthmaticus   Anxiety   Hypokalemia    Time spent: 30 minutes    Barton Dubois  [facility] Pager [phone]. If 7PM-7AM, please contact night-coverage at [email or web address], password [email or web address] 11/11/2014, 9:04 AM  LOS: 1 day

## 2014-11-12 DIAGNOSIS — K219 Gastro-esophageal reflux disease without esophagitis: Secondary | ICD-10-CM

## 2014-11-12 LAB — GLUCOSE, CAPILLARY: Glucose-Capillary: 119 mg/dL — ABNORMAL HIGH (ref 65–99)

## 2014-11-12 MED ORDER — AMOXICILLIN-POT CLAVULANATE 875-125 MG PO TABS: 1.0000 | ORAL_TABLET | Freq: Two times a day (BID) | ORAL | Status: DC

## 2014-11-12 MED ORDER — FAMOTIDINE 40 MG PO TABS: 40.0000 mg | ORAL_TABLET | Freq: Every day | ORAL | Status: DC

## 2014-11-12 MED ORDER — PREDNISONE 20 MG PO TABS: ORAL_TABLET | ORAL | Status: DC

## 2014-11-12 MED ORDER — PANTOPRAZOLE SODIUM 40 MG PO TBEC
40.0000 mg | DELAYED_RELEASE_TABLET | Freq: Every day | ORAL | Status: DC
Start: 2014-11-12 — End: 2015-01-31

## 2014-11-12 NOTE — Progress Notes (Signed)
Patient verbalized understanding of discharge instructions. IV d/c'd cath intact. C.Layman Gully,RN

## 2014-11-12 NOTE — Discharge Summary (Signed)
Physician Discharge Summary  Meagan Chung IDP:[id number] DOB: [birth year] DOA: 11/09/2014  PCP: Zane Herald, MD  Admit date: 11/09/2014 Discharge date: 11/12/2014  Time spent: 40 minutes  Recommendations for Outpatient Follow-up:  1. Repeat BMET to follow electrolytes 2. Pulmonary service to review long term therapy and adjust maintenance treatment for asthma; too many exacerbation recently. Might need PFT's 3. Repeat CXR in 3 weeks to follow resolution of infiltrates  Discharge Diagnoses:  CAP (community acquired pneumonia) Status asthmaticus Anxiety Hypokalemia GERD Depression   Discharge Condition: stable and improved. Will discharge home with hospital follow up appointment with PCP in 2-3 weeks and with arranged follow up with pulmonary service on 11/25/14 at 10:00 am  Diet recommendation: regular diet  Filed Weights   11/10/14 0526  Weight: 84.369 kg (186 lb)    History of present illness:  29 y.o. female, with asthma who states that she has been sick every 4 weeks since April. She presented to [facility] last evening with difficulty breathing and paroxysmal cough and was found to have right sided pneumonia on xray. The patient has been having cough and progressive SOB for 3 weeks. 2 weeks ago she was started on doxy and prednisone. This past Thursday 9/8, cefdinir was added and she received a shot of rocephin IM as well. She regularly uses nebs, usually with good response. Yesterday she used her nebulizer machine 6 times and had no improvement. After treatment at [facility] her cough is becoming productive and she is bringing up yellow / green sputum.  Hospital Course:  1-CAP: no fever and with improvement in her breathing -will discharge on augmentin  -encourage to maintain good hydration  -follow up with PCP in 2-3 weeks; at that time we can repeat CXR to follow resolution of infiltrates  2-asthma exacerbation: -we have arrange follow up with pulmonology service to have her  long term therapy reviewed and most likely to repeat PFT's/establish care here in town  -will continue steroids tapering, continue inhalers/nebulizer treatment  -will also continue PPI for GI protection and potential GERD (see below)  3-hypokalemia: -repleted and WNL at discharge -most likely secondary to use of nebulizer therapy  4-depression/anxiety: will continue celexa and PRN klonopin   5-hypothyroidism: will continue thyroid supplementation  6-sinusitis: will resume cetirizine   7-GERD: will use protonix and QHS pepcid  Procedures:  See below for x-ray reports   Consultations:  None   Discharge Exam: Filed Vitals:   11/12/14 0325  BP: 132/74  Pulse: 87  Temp: 98 F (36.7 C)  Resp: 12    General: Afebrile, no CP, reports significant improvement in her breathing  Cardiovascular: S1 and S2, no rubs or gallops  Respiratory: no wheezing appreciated on lung exam; good air movement; scattered rhonchi. mild wheezing heard around her neck area (accountable for upper airway)   Abdomen: soft, NT, ND, positive BS  Musculoskeletal: no edema, no cyanosis, no clubbing  Discharge Instructions   Discharge Instructions    Discharge instructions    Complete by:  As directed   Follow with pulmonologist as instructed Take medications as prescribed Keep yourself well hydrated and take antibiotics/prednisone around food to minimize GI upset symptoms Increase/resume activity gradually     Increase activity slowly    Complete by:  As directed           Current Discharge Medication List    START taking these medications   Details  amoxicillin-clavulanate (AUGMENTIN) 875-125 MG per tablet Take 1 tablet by mouth every 12 (twelve)  hours. Qty: 10 tablet, Refills: 0    famotidine (PEPCID) 40 MG tablet Take 1 tablet (40 mg total) by mouth at bedtime. Qty: 30 tablet, Refills: 1    pantoprazole (PROTONIX) 40 MG tablet Take 1 tablet (40 mg total) by mouth daily. Qty: 30  tablet, Refills: 1    predniSONE (DELTASONE) 20 MG tablet Take 3 tablets by mouth daily X 2 days; then 2 tablets by mouth daily X 2 days; then 1 tablet by mouth daily X 3 days; then 1/2 tablet by mouth daily X 3 days and stop prednisone Qty: 18 tablet, Refills: 0      CONTINUE these medications which have NOT CHANGED   Details  albuterol (PROVENTIL HFA;VENTOLIN HFA) 108 (90 BASE) MCG/ACT inhaler Inhale 1-2 puffs into the lungs every 6 (six) hours as needed for wheezing or shortness of breath.    cetirizine (ZYRTEC) 10 MG tablet Take 10 mg by mouth at bedtime.    citalopram (CELEXA) 40 MG tablet Take 40 mg by mouth at bedtime.    clonazePAM (KLONOPIN) 0.5 MG tablet Take 0.5-1 mg by mouth at bedtime.    DULERA 200-5 MCG/ACT AERO Inhale 1 puff into the lungs 2 (two) times daily. Refills: 1    ipratropium-albuterol (DUONEB) 0.5-2.5 (3) MG/3ML SOLN Take 3 mLs by nebulization every 4 (four) hours as needed.    lamoTRIgine (LAMICTAL) 200 MG tablet Take 300 mg by mouth at bedtime.    Thyroid (NATURE-THROID) 48.75 MG TABS Take 48.75 mg by mouth 2 (two) times daily.       Allergies  Allergen Reactions  . Other Hives     Chicken causes Hives Capers causes SOB   Follow-up Information    Follow up with Christinia Gully, MD. Go on 11/25/2014.   Specialty:  Pulmonary Disease   Why:  at 10:00 am   Contact information:   [address] [place] [address] [phone]       Follow up with Zane Herald, MD. Schedule an appointment as soon as possible for a visit in 2 weeks.   Specialty:  Family Medicine   Contact information:   [address] [place]  [address] [phone]       The results of significant diagnostics from this hospitalization (including imaging, microbiology, ancillary and laboratory) are listed below for reference.    Significant Diagnostic Studies: Dg Chest 2 View  11/09/2014   CLINICAL DATA:  Cough. Pneumonia for several days.  Shortness of breath.  EXAM: CHEST  2 VIEW  COMPARISON:  None.  FINDINGS: Cardiomediastinal silhouette is within normal limits. There is moderate central airway thickening with mildly asymmetric right infrahilar opacity. No pleural effusion or pneumothorax is seen. No acute osseous abnormality is identified.  IMPRESSION: Airway thickening which may reflect bronchitis or reactive airways disease. Slightly asymmetric right infrahilar opacity, with developing pneumonia in this region not excluded.   Electronically Signed   By: Logan Bores M.D.   On: 11/09/2014 20:46    Microbiology: Recent Results (from the past 240 hour(s))  MRSA PCR Screening     Status: None   Collection Time: 11/10/14  6:47 AM  Result Value Ref Range Status   MRSA by PCR NEGATIVE NEGATIVE Final    Comment:        The GeneXpert MRSA Assay (FDA approved for NASAL specimens only), is one component of a comprehensive MRSA colonization surveillance program. It is not intended to diagnose MRSA infection nor to guide or monitor treatment  for MRSA infections.   Culture, sputum-assessment     Status: None   Collection Time: 11/10/14 10:52 AM  Result Value Ref Range Status   Specimen Description SPUTUM  Final   Special Requests Immunocompromised  Final   Sputum evaluation   Final    MICROSCOPIC FINDINGS SUGGEST THAT THIS SPECIMEN IS NOT REPRESENTATIVE OF LOWER RESPIRATORY SECRETIONS. PLEASE RECOLLECT. Gram Stain Report Called to,Read Back By and Verified With: L. [place] [id number] BY Rhea Bleacher    Report Status 11/10/2014 FINAL  Final     Labs: Basic Metabolic Panel:  Recent Labs Lab 11/09/14 2046 11/10/14 1300 11/11/14 0225  NA 140 135 138  K 3.4* 4.0 3.6  CL 109 107 109  CO2 21* 19* 22  GLUCOSE 104* 167* 143*  BUN 18 9 8   CREATININE 1.14* 0.61 0.76  CALCIUM 9.1 9.0 9.3  MG  --  2.2  --    CBC:  Recent Labs Lab 11/09/14 2046 11/10/14 1300 11/11/14 0225  WBC 9.2 7.6 16.0*  NEUTROABS 5.0   --   --   HGB 13.1 12.9 13.1  HCT 39.3 38.9 40.3  MCV 88.9 88.6 90.0  PLT 266 267 287   CBG:  Recent Labs Lab 11/11/14 0726 11/11/14 1221 11/11/14 1622 11/11/14 2202 11/12/14 0743  GLUCAP 136* 136* 133* 135* 119*    Signed:  Barton Dubois  [facility] 11/12/2014, 8:25 AM

## 2014-11-22 ENCOUNTER — Ambulatory Visit (INDEPENDENT_AMBULATORY_CARE_PROVIDER_SITE_OTHER): Payer: Managed Care, Other (non HMO) | Admitting: Family Medicine

## 2014-11-22 ENCOUNTER — Encounter: Payer: Self-pay | Admitting: Family Medicine

## 2014-11-22 VITALS — BP 100/68 | HR 85 | Temp 97.9°F | Resp 18 | Ht 66.0 in | Wt 187.8 lb

## 2014-11-22 DIAGNOSIS — J45902 Unspecified asthma with status asthmaticus: Secondary | ICD-10-CM

## 2014-11-22 DIAGNOSIS — B379 Candidiasis, unspecified: Secondary | ICD-10-CM

## 2014-11-22 DIAGNOSIS — E079 Disorder of thyroid, unspecified: Secondary | ICD-10-CM

## 2014-11-22 DIAGNOSIS — F419 Anxiety disorder, unspecified: Secondary | ICD-10-CM | POA: Insufficient documentation

## 2014-11-22 DIAGNOSIS — F329 Major depressive disorder, single episode, unspecified: Secondary | ICD-10-CM

## 2014-11-22 DIAGNOSIS — R197 Diarrhea, unspecified: Secondary | ICD-10-CM

## 2014-11-22 DIAGNOSIS — E041 Nontoxic single thyroid nodule: Secondary | ICD-10-CM | POA: Insufficient documentation

## 2014-11-22 DIAGNOSIS — J189 Pneumonia, unspecified organism: Secondary | ICD-10-CM

## 2014-11-22 DIAGNOSIS — K219 Gastro-esophageal reflux disease without esophagitis: Secondary | ICD-10-CM

## 2014-11-22 DIAGNOSIS — F32A Depression, unspecified: Secondary | ICD-10-CM

## 2014-11-22 LAB — CBC
HCT: 43.6 % (ref 36.0–46.0)
Hemoglobin: 14.4 g/dL (ref 12.0–15.0)
MCHC: 33.1 g/dL (ref 30.0–36.0)
MCV: 89.8 fl (ref 78.0–100.0)
Platelets: 291 10*3/uL (ref 150.0–400.0)
RBC: 4.85 Mil/uL (ref 3.87–5.11)
RDW: 13.6 % (ref 11.5–15.5)
WBC: 16.8 10*3/uL — ABNORMAL HIGH (ref 4.0–10.5)

## 2014-11-22 LAB — COMPREHENSIVE METABOLIC PANEL
ALT: 17 U/L (ref 0–35)
AST: 15 U/L (ref 0–37)
Albumin: 4.7 g/dL (ref 3.5–5.2)
Alkaline Phosphatase: 59 U/L (ref 39–117)
BUN: 22 mg/dL (ref 6–23)
CHLORIDE: 102 meq/L (ref 96–112)
CO2: 25 meq/L (ref 19–32)
CREATININE: 0.83 mg/dL (ref 0.40–1.20)
Calcium: 10 mg/dL (ref 8.4–10.5)
GFR: 85.58 mL/min (ref >60.00)
GLUCOSE: 108 mg/dL — AB (ref 70–99)
Potassium: 3.9 mEq/L (ref 3.5–5.1)
SODIUM: 137 meq/L (ref 135–145)
Total Bilirubin: 0.5 mg/dL (ref 0.2–1.2)
Total Protein: 7.4 g/dL (ref 6.0–8.3)

## 2014-11-22 LAB — T4, FREE: Free T4: 1.06 ng/dL (ref 0.60–1.60)

## 2014-11-22 LAB — T3, FREE: T3, Free: 3.7 pg/mL (ref 2.3–4.2)

## 2014-11-22 MED ORDER — PREDNISONE 5 MG PO TABS: ORAL_TABLET | ORAL | Status: DC

## 2014-11-22 MED ORDER — FLUCONAZOLE 150 MG PO TABS: ORAL_TABLET | ORAL | Status: DC

## 2014-11-22 NOTE — Progress Notes (Signed)
Patient ID: Meagan Chung, female   DOB: [birth year], 29 y.o.   MRN: [id number]    Subjective:    Patient ID: Meagan Chung, female    DOB: January 26, 1986, 29 y.o.   MRN: [id number]  Chief Complaint  Patient presents with  . Follow-up    HPI Patient is in today for new patient appointment. She has been struggling for quite some time with recurrent pulmonary illness. She has recently been hospitalized with a severe asthma exacerbation and was titrated off Steri-Strips quickly and is struggling with severe fatigue. Her shortness of breath and cough are improved although not completely gone. She has no chest pain or palpitations. No fevers or chills. She relays a history significant for asthma, right ankle fracture and surgical repair in February 2016 at [facility] [place]. She has a distant history of asthma which is recently flared as previously mentioned. In May she was diagnosed with a pneumonia via chest x-ray and placed on Levaquin and prednisone which was helpful. Has been following with [facility]. Currently she is also struggling with some mild headache and diarrhea. Denies CP/palp/SOB/HA/fevers/GI or GU c/o. Taking meds as prescribed  Past Medical History  Diagnosis Date  . Asthma   . Depression     anxiety, bulemia in past all well controlled  . Thyroid nodule   . Diarrhea 11/29/2014    Past Surgical History  Procedure Laterality Date  . Ankle surgery    . Wisdom tooth extraction Bilateral     Family History  Problem Relation Age of Onset  . Stroke Father     2  . Anorexia nervosa Sister     28  . Kidney Stones Sister   . Thyroid disease Brother   . Obesity Brother   . Dementia Maternal Grandmother   . Osteoporosis Maternal Grandmother   . Cancer Maternal Grandmother     breast  . Cancer Maternal Grandfather     prostate  . Alcohol abuse Maternal Grandfather   . Cancer Paternal Grandmother 10    3 masses in pancreas and thyroid, suspect cancer    Social  History   Social History  . Marital Status: Single    Spouse Name: N/A  . Number of Children: 0  . Years of Education: N/A   Occupational History  . development    Social History Main Topics  . Smoking status: Never Smoker   . Smokeless tobacco: Not on file  . Alcohol Use: 0.0 oz/week    0 Standard drinks or equivalent per week     Comment: rarely  . Drug Use: No  . Sexual Activity: Yes     Comment: lives with fiance, fund raise for [employer], avoids gluten, minimize dairy,   Other Topics Concern  . Not on file   Social History Narrative    Outpatient Prescriptions Prior to Visit  Medication Sig Dispense Refill  . albuterol (PROVENTIL HFA;VENTOLIN HFA) 108 (90 BASE) MCG/ACT inhaler Inhale 1-2 puffs into the lungs every 6 (six) hours as needed for wheezing or shortness of breath.    . cetirizine (ZYRTEC) 10 MG tablet Take 10 mg by mouth at bedtime.    . citalopram (CELEXA) 40 MG tablet Take 40 mg by mouth at bedtime.    . clonazePAM (KLONOPIN) 0.5 MG tablet Take 0.5-1 mg by mouth at bedtime.    . famotidine (PEPCID) 40 MG tablet Take 1 tablet (40 mg total) by mouth at bedtime. 30 tablet 1  . ipratropium-albuterol (DUONEB) 0.5-2.5 (3)  MG/3ML SOLN Take 3 mLs by nebulization every 4 (four) hours as needed.    . lamoTRIgine (LAMICTAL) 200 MG tablet Take 300 mg by mouth at bedtime.    . pantoprazole (PROTONIX) 40 MG tablet Take 1 tablet (40 mg total) by mouth daily. 30 tablet 1  . predniSONE (DELTASONE) 20 MG tablet Take 3 tablets by mouth daily X 2 days; then 2 tablets by mouth daily X 2 days; then 1 tablet by mouth daily X 3 days; then 1/2 tablet by mouth daily X 3 days and stop prednisone 18 tablet 0  . Thyroid (NATURE-THROID) 48.75 MG TABS Take 48.75 mg by mouth 2 (two) times daily.    . DULERA 200-5 MCG/ACT AERO Inhale 1 puff into the lungs 2 (two) times daily.  1  . amoxicillin-clavulanate (AUGMENTIN) 875-125 MG per tablet Take 1 tablet by mouth every 12 (twelve)  hours. 10 tablet 0   No facility-administered medications prior to visit.    Allergies  Allergen Reactions  . Other Hives    Capers causes SOB    Review of Systems  Constitutional: Negative for fever and malaise/fatigue.  HENT: Negative for congestion.   Eyes: Negative for discharge.  Respiratory: Negative for shortness of breath.   Cardiovascular: Negative for chest pain, palpitations and leg swelling.  Gastrointestinal: Negative for nausea and abdominal pain.  Genitourinary: Negative for dysuria.  Musculoskeletal: Negative for falls.  Skin: Negative for rash.  Neurological: Negative for loss of consciousness and headaches.  Endo/Heme/Allergies: Negative for environmental allergies.  Psychiatric/Behavioral: Negative for depression. The patient is nervous/anxious.        Objective:    Physical Exam  Constitutional: She is oriented to person, place, and time. She appears well-developed and well-nourished. No distress.  HENT:  Head: Normocephalic and atraumatic.  Eyes: Conjunctivae are normal.  Neck: Neck supple. No thyromegaly present.  Cardiovascular: Normal rate, regular rhythm and normal heart sounds.   No murmur heard. Pulmonary/Chest: Effort normal and breath sounds normal. No respiratory distress.  Abdominal: Soft. Bowel sounds are normal. She exhibits no distension and no mass. There is no tenderness.  Musculoskeletal: She exhibits no edema.  Lymphadenopathy:    She has no cervical adenopathy.  Neurological: She is alert and oriented to person, place, and time.  Skin: Skin is warm and dry.  Psychiatric: She has a normal mood and affect. Her behavior is normal.    BP 100/68 mmHg  Pulse 85  Temp(Src) 97.9 F (36.6 C) (Oral)  Resp 18  Ht 5\' 6"  (1.676 m)  Wt 187 lb 12.8 oz (85.186 kg)  BMI 30.33 kg/m2  SpO2 97%  LMP 11/11/2014 Wt Readings from Last 3 Encounters:  11/25/14 182 lb 9.6 oz (82.827 kg)  11/22/14 187 lb 12.8 oz (85.186 kg)  11/10/14 186 lb  (84.369 kg)     Lab Results  Component Value Date   WBC 16.8* 11/22/2014   HGB 14.4 11/22/2014   HCT 43.6 11/22/2014   PLT 291.0 11/22/2014   GLUCOSE 108* 11/22/2014   ALT 17 11/22/2014   AST 15 11/22/2014   NA 137 11/22/2014   K 3.9 11/22/2014   CL 102 11/22/2014   CREATININE 0.83 11/22/2014   BUN 22 11/22/2014   CO2 25 11/22/2014   TSH 0.313* 11/10/2014    Lab Results  Component Value Date   TSH 0.313* 11/10/2014   Lab Results  Component Value Date   WBC 16.8* 11/22/2014   HGB 14.4 11/22/2014   HCT 43.6 11/22/2014  MCV 89.8 11/22/2014   PLT 291.0 11/22/2014   Lab Results  Component Value Date   NA 137 11/22/2014   K 3.9 11/22/2014   CO2 25 11/22/2014   GLUCOSE 108* 11/22/2014   BUN 22 11/22/2014   CREATININE 0.83 11/22/2014   BILITOT 0.5 11/22/2014   ALKPHOS 59 11/22/2014   AST 15 11/22/2014   ALT 17 11/22/2014   PROT 7.4 11/22/2014   ALBUMIN 4.7 11/22/2014   CALCIUM 10.0 11/22/2014   ANIONGAP 7 11/11/2014   GFR 85.58 11/22/2014   No results found for: CHOL  Assessment & Plan:   Problem List Items Addressed This Visit    Status asthmaticus    Recently hospitalized with bad flare in asthma feeling better in this regard, no change in meds for now, may need referral to pulmonology if symptoms worsen      Esophageal reflux    Avoid offending foods, start probiotics. Do not eat large meals in late evening and consider raising head of bed.       Diarrhea    S/p solumedrol and 3 antibiotics, Cdiff and stool cultures neg. Start fiber and probiotics and report if symptoms worsen      Relevant Orders   CBC (Completed)   Comprehensive metabolic panel (Completed)   T4, free (Completed)   T3, free (Completed)   Stool Culture   Ova and parasite examination   Stool, WBC/Lactoferrin (Completed)   Clostridium Difficile by PCR (Completed)   Depression    Doing well considering how she has been, continue current meds for now       Other Visit  Diagnoses    Thyroid disease    -  Primary    Relevant Orders    CBC (Completed)    Comprehensive metabolic panel (Completed)    T4, free (Completed)    T3, free (Completed)    Stool Culture    Ova and parasite examination    Stool, WBC/Lactoferrin (Completed)    Clostridium Difficile by PCR (Completed)    Pneumonia, organism unspecified        Relevant Medications    fluconazole (DIFLUCAN) 150 MG tablet    Other Relevant Orders    CBC (Completed)    Comprehensive metabolic panel (Completed)    T4, free (Completed)    T3, free (Completed)    Stool Culture    Ova and parasite examination    Stool, WBC/Lactoferrin (Completed)    Clostridium Difficile by PCR (Completed)    Yeast infection        Relevant Medications    fluconazole (DIFLUCAN) 150 MG tablet       I have discontinued Ms. Sayers's amoxicillin-clavulanate. I am also having her start on fluconazole. Additionally, I am having her maintain her Thyroid, lamoTRIgine, citalopram, clonazePAM, cetirizine, albuterol, ipratropium-albuterol, pantoprazole, famotidine, and predniSONE.  Meds ordered this encounter  Medications  . DISCONTD: predniSONE (DELTASONE) 5 MG tablet    Sig: 2 tabs po daily x 3 days then 1 tab po daily x 3 days then 1/2 po daily x 5 days    Dispense:  12 tablet    Refill:  0  . fluconazole (DIFLUCAN) 150 MG tablet    Sig: 1 tab po daily x 3 days then 1 tab po weekly x 4 weeks    Dispense:  7 tablet    Refill:  1     BLYTH, STACEY, MD

## 2014-11-22 NOTE — Patient Instructions (Addendum)
Probiotic daily [employer] or [facility] or online at [email or web address] company 10 strain probiotic. Desitin Zinc Sitz baths Gatorade   Pneumonia Pneumonia is an infection of the lungs.  CAUSES Pneumonia may be caused by bacteria or a virus. Usually, these infections are caused by breathing infectious particles into the lungs (respiratory tract). SIGNS AND SYMPTOMS   Cough.  Fever.  Chest pain.  Increased rate of breathing.  Wheezing.  Mucus production. DIAGNOSIS  If you have the common symptoms of pneumonia, your health care provider will typically confirm the diagnosis with a chest X-ray. The X-ray will show an abnormality in the lung (pulmonary infiltrate) if you have pneumonia. Other tests of your blood, urine, or sputum may be done to find the specific cause of your pneumonia. Your health care provider may also do tests (blood gases or pulse oximetry) to see how well your lungs are working. TREATMENT  Some forms of pneumonia may be spread to other people when you cough or sneeze. You may be asked to wear a mask before and during your exam. Pneumonia that is caused by bacteria is treated with antibiotic medicine. Pneumonia that is caused by the influenza virus may be treated with an antiviral medicine. Most other viral infections must run their course. These infections will not respond to antibiotics.  HOME CARE INSTRUCTIONS   Cough suppressants may be used if you are losing too much rest. However, coughing protects you by clearing your lungs. You should avoid using cough suppressants if you can.  Your health care provider may have prescribed medicine if he or she thinks your pneumonia is caused by bacteria or influenza. Finish your medicine even if you start to feel better.  Your health care provider may also prescribe an expectorant. This loosens the mucus to be coughed up.  Take medicines only as directed by your health care provider.  Do not smoke.  Smoking is a common cause of bronchitis and can contribute to pneumonia. If you are a smoker and continue to smoke, your cough may last several weeks after your pneumonia has cleared.  A cold steam vaporizer or humidifier in your room or home may help loosen mucus.  Coughing is often worse at night. Sleeping in a semi-upright position in a recliner or using a couple pillows under your head will help with this.  Get rest as you feel it is needed. Your body will usually let you know when you need to rest. PREVENTION A pneumococcal shot (vaccine) is available to prevent a common bacterial cause of pneumonia. This is usually suggested for:  People over 29 years old.  Patients on chemotherapy.  People with chronic lung problems, such as bronchitis or emphysema.  People with immune system problems. If you are over 65 or have a high risk condition, you may receive the pneumococcal vaccine if you have not received it before. In some countries, a routine influenza vaccine is also recommended. This vaccine can help prevent some cases of pneumonia.You may be offered the influenza vaccine as part of your care. If you smoke, it is time to quit. You may receive instructions on how to stop smoking. Your health care provider can provide medicines and counseling to help you quit. SEEK MEDICAL CARE IF: You have a fever. SEEK IMMEDIATE MEDICAL CARE IF:   Your illness becomes worse. This is especially true if you are elderly or weakened from any other disease.  You cannot control your cough with suppressants and are losing sleep.  You begin coughing up blood.  You develop pain which is getting worse or is uncontrolled with medicines.  Any of the symptoms which initially brought you in for treatment are getting worse rather than better.  You develop shortness of breath or chest pain. MAKE SURE YOU:   Understand these instructions.  Will watch your condition.  Will get help right away if you are  not doing well or get worse. Document Released: 02/15/2005 Document Revised: 07/02/2013 Document Reviewed: 05/07/2010 [facility] Patient Information 2015 [place], [place]. This information is not intended to replace advice given to you by your health care provider. Make sure you discuss any questions you have with your health care provider.

## 2014-11-22 NOTE — Progress Notes (Signed)
Pre visit review using our clinic review tool, if applicable. No additional management support is needed unless otherwise documented below in the visit note. 

## 2014-11-25 ENCOUNTER — Encounter: Payer: Self-pay | Admitting: Internal Medicine

## 2014-11-25 ENCOUNTER — Telehealth: Payer: Self-pay | Admitting: *Deleted

## 2014-11-25 ENCOUNTER — Ambulatory Visit (INDEPENDENT_AMBULATORY_CARE_PROVIDER_SITE_OTHER): Payer: Managed Care, Other (non HMO) | Admitting: Internal Medicine

## 2014-11-25 VITALS — BP 114/62 | HR 89 | Ht 66.0 in | Wt 182.6 lb

## 2014-11-25 DIAGNOSIS — J45909 Unspecified asthma, uncomplicated: Secondary | ICD-10-CM

## 2014-11-25 MED ORDER — MOMETASONE FURO-FORMOTEROL FUM 200-5 MCG/ACT IN AERO: INHALATION_SPRAY | RESPIRATORY_TRACT | Status: DC

## 2014-11-25 NOTE — Telephone Encounter (Signed)
Patient wanted Dr. Charlett Blake to know that she saw Dr. Melvyn Novas this morning and he did not do CXR or PFT's.  He told her to stop Prednisone which she had just gotten another 15 days worth- she states she was having some withdrawal sx which is why it was continued.  Should she keep taking this?

## 2014-11-25 NOTE — Progress Notes (Signed)
Subjective:     Patient ID: Meagan Chung, female   DOB: 12-Oct-1985,    MRN: [id number]  HPI  87 yowf never smoker grandfather(very close to her)  was internist with hx  asthma dx age 29 difficult to control with freq ER/ missed school did some better finally off all meds ats teenager and did sports fine but then   March 2016 while  recovery from ankle surgery> sob but only needed just alb before ex then flu like syndrome April and rx dulera 200 but not consistent with it and "sick every 4 weeks since"   Admit date: 11/09/2014 Discharge date: 11/12/2014  Recommendations for Outpatient Follow-up:  1. Repeat BMET to follow electrolytes 2. Pulmonary service to review long term therapy and adjust maintenance treatment for asthma; too many exacerbation recently. Might need PFT's 3. Repeat CXR in 3 weeks to follow resolution of infiltrates  Discharge Diagnoses:  CAP (community acquired pneumonia) Status asthmaticus Anxiety Hypokalemia GERD Depression   Discharge Condition: stable and improved. Will discharge home with hospital follow up appointment with PCP in 2-3 weeks and with arranged follow up with pulmonary service on 11/25/14 at 10:00 am  Diet recommendation: regular diet  Filed Weights   11/10/14 0526  Weight: 84.369 kg (186 lb)    History of present illness:  29 y.o. female, with asthma who states that she has been sick every 4 weeks since April. She presented to [facility] last evening with difficulty breathing and paroxysmal cough and was found to have right sided pneumonia on xray. The patient has been having cough and progressive SOB for 3 weeks. 2 weeks ago she was started on doxy and prednisone. This past Thursday 9/8, cefdinir was added and she received a shot of rocephin IM as well. She regularly uses nebs, usually with good response. Yesterday she used her nebulizer machine 6 times and had no improvement. After treatment at [facility] her cough is becoming productive and she  is bringing up yellow / green sputum.  Hospital Course:  1-CAP: no fever and with improvement in her breathing -will discharge on augmentin  -encourage to maintain good hydration  -follow up with PCP in 2-3 weeks; at that time we can repeat CXR to follow resolution of infiltrates  2-asthma exacerbation: -we have arrange follow up with pulmonology service to have her long term therapy reviewed and most likely to repeat PFT's/establish care here in town  -will continue steroids tapering, continue inhalers/nebulizer treatment  -will also continue PPI for GI protection and potential GERD (see below)  3-hypokalemia: -repleted and WNL at discharge -most likely secondary to use of nebulizer therapy  4-depression/anxiety: will continue celexa and PRN klonopin   5-hypothyroidism: will continue thyroid supplementation  6-sinusitis: will resume cetirizine   7-GERD: will use protonix and QHS pepcid       11/25/2014 1st [facility] office visit/ [facility]  On dulera 200/ max gerd rx/ pred taper  Chief Complaint  Patient presents with  . Advice Only    refer Dr. Dyann Kief for asthma. Pt had been DX x age 4. Pt was in hospital recently. c/o dry cough. no wheezing, no chest tx  feeling quite a bit better at time of ov and much less need for saba   No obvious day to day or daytime variability or assoc chronic cough or cp or chest tightness, subjective wheeze or overt sinus or hb symptoms. No unusual exp hx or h/o childhood pna/ asthma or knowledge of premature birth.  Sleeping ok  without nocturnal  or early am exacerbation  of respiratory  c/o's or need for noct saba. Also denies any obvious fluctuation of symptoms with weather or environmental changes or other aggravating or alleviating factors except as outlined above   Current Medications, Allergies, Complete Past Medical History, Past Surgical History, Family History, and Social History were reviewed in [employer]  record.  ROS  The following are not active complaints unless bolded sore throat, dysphagia, dental problems, itching, sneezing,  nasal congestion or excess/ purulent secretions, ear ache,   fever, chills, sweats, unintended wt loss, classically pleuritic or exertional cp, hemoptysis,  orthopnea pnd or leg swelling, presyncope, palpitations, abdominal pain, anorexia, nausea, vomiting, diarrhea  or change in bowel or bladder habits, change in stools or urine, dysuria,hematuria,  rash, arthralgias, visual complaints, headache, numbness, weakness or ataxia or problems with walking or coordination,  change in mood/affect or memory.                Review of Systems     Objective:   Physical Exam Somber amb wf nad  Wt Readings from Last 3 Encounters:  11/25/14 182 lb 9.6 oz (82.827 kg)  11/22/14 187 lb 12.8 oz (85.186 kg)  11/10/14 186 lb (84.369 kg)    Vital signs reviewed  HEENT: nl dentition, turbinates, and orophanx. Nl external ear canals without cough reflex   NECK :  without JVD/Nodes/TM/ nl carotid upstrokes bilaterally   LUNGS: no acc muscle use, clear to A and P bilaterally without cough on insp or exp maneuvers   CV:  RRR  no s3 or murmur or increase in P2, no edema   ABD:  soft and nontender with nl excursion in the supine position. No bruits or organomegaly, bowel sounds nl  MS:  warm without deformities, calf tenderness, cyanosis or clubbing  SKIN: warm and dry without lesions    NEURO:  alert, approp, no deficits   I personally reviewed images and agree with radiology impression as follows:  CXR:  11/09/14 Airway thickening which may reflect bronchitis or reactive airways disease. Slightly asymmetric right infrahilar opacity, with developing pneumonia in this region not excluded.    Lab Results  Component Value Date   DDIMER 0.44 11/09/2014    Eos 1.0  On 11/09/14      Assessment:

## 2014-11-25 NOTE — Patient Instructions (Signed)
Plan A =  Automatic = Dulera 200 Take 2 puffs first thing in am and then another 2 puffs about 12 hours later(or at bedtime)  Plan B=  Backup Only use your albuterol as a rescue medication to be used if you can't catch your breath by resting or doing a relaxed purse lip breathing pattern.  - The less you use it, the better it will work when you need it. - Ok to use up to 2 puffs  every 4 hours if you must but call for immediate appointment if use goes up over your usual need - Don't leave home without it !!  (think of it like the spare tire for your car)   If doing great ok to leave off acid suppression, but it's the first medication to start if coughing at all  GERD (REFLUX)  is an extremely common cause of respiratory symptoms just like yours , many times with no obvious heartburn at all.    It can be treated with medication, but also with lifestyle changes including elevation of the head of your bed (ideally with 6 inch  bed blocks),  Smoking cessation, avoidance of late meals, excessive alcohol, and avoid fatty foods, chocolate, peppermint, colas, red wine, and acidic juices such as orange juice.  NO MINT OR MENTHOL PRODUCTS SO NO COUGH DROPS  USE SUGARLESS CANDY INSTEAD (Jolley ranchers or Stover's or Life Savers) or even ice chips will also do - the key is to swallow to prevent all throat clearing. NO OIL BASED VITAMINS - use powdered substitutes.    Please schedule a follow up office visit in 4 weeks, sooner if needed

## 2014-11-25 NOTE — Telephone Encounter (Signed)
The steroids were more for fatigue due to abrupt cessation so we were tapering her more slowly if she is still suffering from significant fatigue can taper more quickly by just doing each dose x 2 days or if fatigue has lifted can forego taper.

## 2014-11-25 NOTE — Telephone Encounter (Signed)
Called left message to call back 

## 2014-11-25 NOTE — Telephone Encounter (Signed)
Patient called back and did inform her of PCP instructions.

## 2014-11-26 LAB — CLOSTRIDIUM DIFFICILE BY PCR

## 2014-11-26 LAB — OVA AND PARASITE EXAMINATION: OP: NONE SEEN

## 2014-11-26 LAB — FECAL LACTOFERRIN, QUANT: Lactoferrin: NEGATIVE

## 2014-11-28 ENCOUNTER — Encounter: Payer: Self-pay | Admitting: Internal Medicine

## 2014-11-28 DIAGNOSIS — J45909 Unspecified asthma, uncomplicated: Secondary | ICD-10-CM | POA: Insufficient documentation

## 2014-11-28 NOTE — Assessment & Plan Note (Addendum)
DDX of  difficult airways management all start with A and  include Adherence, Ace Inhibitors, Acid Reflux, Active Sinus Disease, Alpha 1 Antitripsin deficiency, Anxiety masquerading as Airways dz,  ABPA,  allergy(esp in young), Aspiration (esp in elderly), Adverse effects of meds,  Active smokers, A bunch of PE's (a small clot burden can't cause this syndrome unless there is already severe underlying pulm or vascular dz with poor reserve) plus two Bs  = Bronchiectasis and Beta blocker use..and one C= CHF   Adherence is always the initial "prime suspect" and is a multilayered concern that requires a "trust but verify" approach in every patient - starting with knowing how to use medications, especially inhalers, correctly, keeping up with refills and understanding the fundamental difference between maintenance and prns vs those medications only taken for a very short course and then stopped and not refilled.  - In this case Adherence is the biggest issue and starts with  inability to use HFA effectively and also  understand that SABA treats the symptoms but doesn't get to the underlying problem (inflammation).  I used  the analogy of putting steroid cream on a rash to help explain the meaning of topical therapy and the need to get the drug to the target tissue.  ? Acid (or non-acid) GERD > always difficult to exclude as up to 75% of pts in some series report no assoc GI/ Heartburn symptoms> rec max (24h)  acid suppression and diet restrictions/ reviewed and instructions given in writing. > ok to stop p off pred and well controlled and not coughing at all  ? Allergy > not elevated eos, will repeat and do profile later after off pred and just on ics  ? Anxiety > usually at the bottom of this list of usual suspects but should be much higher on this pt's based on H and P and note already on psychotropics .   ? A bunch of pe's > the history that her asthma started during the recovery of ankle surgery is very  bothersome for occult pulmonary embolism mimicking asthma but note the negative d-dimer.  D dimer nl - while  A nl value  may miss small peripheral pe, the clot burden with sob is moderately high and the d dimer has a very high neg pred value in this setting    I had an extended discussion with the patient reviewing all relevant studies completed    Each maintenance medication was reviewed in detail including most importantly the difference between maintenance and prns and under what circumstances the prns are to be triggered using an action plan format that is not reflected in the computer generated alphabetically organized AVS.    Please see instructions for details which were reviewed in writing and the patient given a copy highlighting the part that I personally wrote and discussed at today's ov.   .Total time = 2m review case with pt/ discussion/ counseling/ giving and going over instructions (see avs)

## 2014-11-29 ENCOUNTER — Encounter: Payer: Self-pay | Admitting: Family Medicine

## 2014-11-29 DIAGNOSIS — R197 Diarrhea, unspecified: Secondary | ICD-10-CM

## 2014-11-29 HISTORY — DX: Diarrhea, unspecified: R19.7

## 2014-11-29 LAB — STOOL CULTURE

## 2014-11-29 NOTE — Assessment & Plan Note (Signed)
Recently hospitalized with bad flare in asthma feeling better in this regard, no change in meds for now, may need referral to pulmonology if symptoms worsen

## 2014-11-29 NOTE — Assessment & Plan Note (Signed)
Doing well considering how she has been, continue current meds for now

## 2014-11-29 NOTE — Assessment & Plan Note (Signed)
Avoid offending foods, start probiotics. Do not eat large meals in late evening and consider raising head of bed.  

## 2014-11-29 NOTE — Assessment & Plan Note (Signed)
S/p solumedrol and 3 antibiotics, Cdiff and stool cultures neg. Start fiber and probiotics and report if symptoms worsen

## 2014-12-03 ENCOUNTER — Encounter: Payer: Self-pay | Admitting: Family Medicine

## 2014-12-03 ENCOUNTER — Other Ambulatory Visit: Payer: Self-pay | Admitting: Family Medicine

## 2014-12-04 ENCOUNTER — Other Ambulatory Visit: Payer: Self-pay | Admitting: Family Medicine

## 2014-12-04 MED ORDER — VALACYCLOVIR HCL 500 MG PO TABS: ORAL_TABLET | ORAL | Status: DC

## 2014-12-17 ENCOUNTER — Ambulatory Visit: Payer: Managed Care, Other (non HMO) | Admitting: Family Medicine

## 2014-12-23 ENCOUNTER — Ambulatory Visit: Payer: Managed Care, Other (non HMO) | Admitting: Internal Medicine

## 2015-01-30 ENCOUNTER — Telehealth: Payer: Self-pay | Admitting: Behavioral Health

## 2015-01-30 NOTE — Telephone Encounter (Signed)
Unable to reach patient at time of Pre-Visit Call.  Left message for patient to return call when available.    

## 2015-01-31 ENCOUNTER — Ambulatory Visit (HOSPITAL_BASED_OUTPATIENT_CLINIC_OR_DEPARTMENT_OTHER)
Admission: RE | Admit: 2015-01-31 | Discharge: 2015-01-31 | Disposition: A | Discharge status: Home / Self Care | Payer: Managed Care, Other (non HMO) | Source: Ambulatory Visit | Attending: Family Medicine | Admitting: Family Medicine

## 2015-01-31 ENCOUNTER — Ambulatory Visit (INDEPENDENT_AMBULATORY_CARE_PROVIDER_SITE_OTHER): Payer: Managed Care, Other (non HMO) | Admitting: Family Medicine

## 2015-01-31 ENCOUNTER — Encounter: Payer: Self-pay | Admitting: Family Medicine

## 2015-01-31 VITALS — BP 118/72 | HR 76 | Temp 98.8°F | Ht 66.0 in | Wt 176.0 lb

## 2015-01-31 DIAGNOSIS — R739 Hyperglycemia, unspecified: Secondary | ICD-10-CM

## 2015-01-31 DIAGNOSIS — E782 Mixed hyperlipidemia: Secondary | ICD-10-CM

## 2015-01-31 DIAGNOSIS — Z124 Encounter for screening for malignant neoplasm of cervix: Secondary | ICD-10-CM | POA: Diagnosis not present

## 2015-01-31 DIAGNOSIS — R197 Diarrhea, unspecified: Secondary | ICD-10-CM

## 2015-01-31 DIAGNOSIS — J189 Pneumonia, unspecified organism: Secondary | ICD-10-CM

## 2015-01-31 DIAGNOSIS — D72829 Elevated white blood cell count, unspecified: Secondary | ICD-10-CM | POA: Diagnosis not present

## 2015-01-31 DIAGNOSIS — J45909 Unspecified asthma, uncomplicated: Secondary | ICD-10-CM

## 2015-01-31 DIAGNOSIS — K219 Gastro-esophageal reflux disease without esophagitis: Secondary | ICD-10-CM

## 2015-01-31 LAB — COMPREHENSIVE METABOLIC PANEL
ALK PHOS: 77 U/L (ref 39–117)
ALT: 13 U/L (ref 0–35)
AST: 14 U/L (ref 0–37)
Albumin: 4.5 g/dL (ref 3.5–5.2)
BUN: 13 mg/dL (ref 6–23)
CO2: 26 meq/L (ref 19–32)
Calcium: 9.4 mg/dL (ref 8.4–10.5)
Chloride: 106 mEq/L (ref 96–112)
Creatinine, Ser: 0.79 mg/dL (ref 0.40–1.20)
GFR: 90.49 mL/min (ref >60.00)
GLUCOSE: 80 mg/dL (ref 70–99)
POTASSIUM: 4.4 meq/L (ref 3.5–5.1)
SODIUM: 139 meq/L (ref 135–145)
TOTAL PROTEIN: 7.5 g/dL (ref 6.0–8.3)
Total Bilirubin: 0.6 mg/dL (ref 0.2–1.2)

## 2015-01-31 LAB — HEMOGLOBIN A1C: Hgb A1c MFr Bld: 5.2 % (ref 4.6–6.5)

## 2015-01-31 LAB — LIPID PANEL
CHOLESTEROL: 155 mg/dL (ref 0–200)
HDL: 45.9 mg/dL (ref >39.00)
LDL Cholesterol: 100 mg/dL — ABNORMAL HIGH (ref 0–99)
NONHDL: 108.68
Total CHOL/HDL Ratio: 3
Triglycerides: 44 mg/dL (ref 0.0–149.0)
VLDL: 8.8 mg/dL (ref 0.0–40.0)

## 2015-01-31 LAB — CBC
HEMATOCRIT: 41.6 % (ref 36.0–46.0)
HEMOGLOBIN: 13.8 g/dL (ref 12.0–15.0)
MCHC: 33.1 g/dL (ref 30.0–36.0)
MCV: 89.1 fl (ref 78.0–100.0)
Platelets: 305 10*3/uL (ref 150.0–400.0)
RBC: 4.67 Mil/uL (ref 3.87–5.11)
RDW: 12.8 % (ref 11.5–15.5)
WBC: 6.6 10*3/uL (ref 4.0–10.5)

## 2015-01-31 LAB — TSH: TSH: 1.71 u[IU]/mL (ref 0.35–4.50)

## 2015-01-31 MED ORDER — LEVOFLOXACIN 500 MG PO TABS: 500.0000 mg | ORAL_TABLET | Freq: Every day | ORAL | Status: DC

## 2015-01-31 MED ORDER — METHYLPREDNISOLONE 4 MG PO TABS: ORAL_TABLET | ORAL | Status: DC

## 2015-01-31 NOTE — Patient Instructions (Signed)
Preventive Care for Adults, Female A healthy lifestyle and preventive care can promote health and wellness. Preventive health guidelines for women include the following key practices.  A routine yearly physical is a good way to check with your health care provider about your health and preventive screening. It is a chance to share any concerns and updates on your health and to receive a thorough exam.  Visit your dentist for a routine exam and preventive care every 6 months. Brush your teeth twice a day and floss once a day. Good oral hygiene prevents tooth decay and gum disease.  The frequency of eye exams is based on your age, health, family medical history, use of contact lenses, and other factors. Follow your health care provider's recommendations for frequency of eye exams.  Eat a healthy diet. Foods like vegetables, fruits, whole grains, low-fat dairy products, and lean protein foods contain the nutrients you need without too many calories. Decrease your intake of foods high in solid fats, added sugars, and salt. Eat the right amount of calories for you.Get information about a proper diet from your health care provider, if necessary.  Regular physical exercise is one of the most important things you can do for your health. Most adults should get at least 150 minutes of moderate-intensity exercise (any activity that increases your heart rate and causes you to sweat) each week. In addition, most adults need muscle-strengthening exercises on 2 or more days a week.  Maintain a healthy weight. The body mass index (BMI) is a screening tool to identify possible weight problems. It provides an estimate of body fat based on height and weight. Your health care provider can find your BMI and can help you achieve or maintain a healthy weight.For adults 20 years and older:  A BMI below 18.5 is considered underweight.  A BMI of 18.5 to 24.9 is normal.  A BMI of 25 to 29.9 is considered overweight.  A  BMI of 30 and above is considered obese.  Maintain normal blood lipids and cholesterol levels by exercising and minimizing your intake of saturated fat. Eat a balanced diet with plenty of fruit and vegetables. Blood tests for lipids and cholesterol should begin at age 45 and be repeated every 5 years. If your lipid or cholesterol levels are high, you are over 50, or you are at high risk for heart disease, you may need your cholesterol levels checked more frequently.Ongoing high lipid and cholesterol levels should be treated with medicines if diet and exercise are not working.  If you smoke, find out from your health care provider how to quit. If you do not use tobacco, do not start.  Lung cancer screening is recommended for adults aged 45-80 years who are at high risk for developing lung cancer because of a history of smoking. A yearly low-dose CT scan of the lungs is recommended for people who have at least a 30-pack-year history of smoking and are a current smoker or have quit within the past 15 years. A pack year of smoking is smoking an average of 1 pack of cigarettes a day for 1 year (for example: 1 pack a day for 30 years or 2 packs a day for 15 years). Yearly screening should continue until the smoker has stopped smoking for at least 15 years. Yearly screening should be stopped for people who develop a health problem that would prevent them from having lung cancer treatment.  If you are pregnant, do not drink alcohol. If you are  breastfeeding, be very cautious about drinking alcohol. If you are not pregnant and choose to drink alcohol, do not have more than 1 drink per day. One drink is considered to be 12 ounces (355 mL) of beer, 5 ounces (148 mL) of wine, or 1.5 ounces (44 mL) of liquor.  Avoid use of street drugs. Do not share needles with anyone. Ask for help if you need support or instructions about stopping the use of drugs.  High blood pressure causes heart disease and increases the risk  of stroke. Your blood pressure should be checked at least every 1 to 2 years. Ongoing high blood pressure should be treated with medicines if weight loss and exercise do not work.  If you are 55-79 years old, ask your health care provider if you should take aspirin to prevent strokes.  Diabetes screening is done by taking a blood sample to check your blood glucose level after you have not eaten for a certain period of time (fasting). If you are not overweight and you do not have risk factors for diabetes, you should be screened once every 3 years starting at age 45. If you are overweight or obese and you are 40-70 years of age, you should be screened for diabetes every year as part of your cardiovascular risk assessment.  Breast cancer screening is essential preventive care for women. You should practice "breast self-awareness." This means understanding the normal appearance and feel of your breasts and may include breast self-examination. Any changes detected, no matter how small, should be reported to a health care provider. Women in their 20s and 30s should have a clinical breast exam (CBE) by a health care provider as part of a regular health exam every 1 to 3 years. After age 40, women should have a CBE every year. Starting at age 40, women should consider having a mammogram (breast X-ray test) every year. Women who have a family history of breast cancer should talk to their health care provider about genetic screening. Women at a high risk of breast cancer should talk to their health care providers about having an MRI and a mammogram every year.  Breast cancer gene (BRCA)-related cancer risk assessment is recommended for women who have family members with BRCA-related cancers. BRCA-related cancers include breast, ovarian, tubal, and peritoneal cancers. Having family members with these cancers may be associated with an increased risk for harmful changes (mutations) in the breast cancer genes BRCA1 and  BRCA2. Results of the assessment will determine the need for genetic counseling and BRCA1 and BRCA2 testing.  Your health care provider may recommend that you be screened regularly for cancer of the pelvic organs (ovaries, uterus, and vagina). This screening involves a pelvic examination, including checking for microscopic changes to the surface of your cervix (Pap test). You may be encouraged to have this screening done every 3 years, beginning at age 21.  For women ages 30-65, health care providers may recommend pelvic exams and Pap testing every 3 years, or they may recommend the Pap and pelvic exam, combined with testing for human papilloma virus (HPV), every 5 years. Some types of HPV increase your risk of cervical cancer. Testing for HPV may also be done on women of any age with unclear Pap test results.  Other health care providers may not recommend any screening for nonpregnant women who are considered low risk for pelvic cancer and who do not have symptoms. Ask your health care provider if a screening pelvic exam is right for   you.  If you have had past treatment for cervical cancer or a condition that could lead to cancer, you need Pap tests and screening for cancer for at least 20 years after your treatment. If Pap tests have been discontinued, your risk factors (such as having a new sexual partner) need to be reassessed to determine if screening should resume. Some women have medical problems that increase the chance of getting cervical cancer. In these cases, your health care provider may recommend more frequent screening and Pap tests.  Colorectal cancer can be detected and often prevented. Most routine colorectal cancer screening begins at the age of 50 years and continues through age 75 years. However, your health care provider may recommend screening at an earlier age if you have risk factors for colon cancer. On a yearly basis, your health care provider may provide home test kits to check  for hidden blood in the stool. Use of a small camera at the end of a tube, to directly examine the colon (sigmoidoscopy or colonoscopy), can detect the earliest forms of colorectal cancer. Talk to your health care provider about this at age 50, when routine screening begins. Direct exam of the colon should be repeated every 5-10 years through age 75 years, unless early forms of precancerous polyps or small growths are found.  People who are at an increased risk for hepatitis B should be screened for this virus. You are considered at high risk for hepatitis B if:  You were born in a country where hepatitis B occurs often. Talk with your health care provider about which countries are considered high risk.  Your parents were born in a high-risk country and you have not received a shot to protect against hepatitis B (hepatitis B vaccine).  You have HIV or AIDS.  You use needles to inject street drugs.  You live with, or have sex with, someone who has hepatitis B.  You get hemodialysis treatment.  You take certain medicines for conditions like cancer, organ transplantation, and autoimmune conditions.  Hepatitis C blood testing is recommended for all people born from 1945 through 1965 and any individual with known risks for hepatitis C.  Practice safe sex. Use condoms and avoid high-risk sexual practices to reduce the spread of sexually transmitted infections (STIs). STIs include gonorrhea, chlamydia, syphilis, trichomonas, herpes, HPV, and human immunodeficiency virus (HIV). Herpes, HIV, and HPV are viral illnesses that have no cure. They can result in disability, cancer, and death.  You should be screened for sexually transmitted illnesses (STIs) including gonorrhea and chlamydia if:  You are sexually active and are younger than 24 years.  You are older than 24 years and your health care provider tells you that you are at risk for this type of infection.  Your sexual activity has changed  since you were last screened and you are at an increased risk for chlamydia or gonorrhea. Ask your health care provider if you are at risk.  If you are at risk of being infected with HIV, it is recommended that you take a prescription medicine daily to prevent HIV infection. This is called preexposure prophylaxis (PrEP). You are considered at risk if:  You are sexually active and do not regularly use condoms or know the HIV status of your partner(s).  You take drugs by injection.  You are sexually active with a partner who has HIV.  Talk with your health care provider about whether you are at high risk of being infected with HIV. If   you choose to begin PrEP, you should first be tested for HIV. You should then be tested every 3 months for as long as you are taking PrEP.  Osteoporosis is a disease in which the bones lose minerals and strength with aging. This can result in serious bone fractures or breaks. The risk of osteoporosis can be identified using a bone density scan. Women ages 67 years and over and women at risk for fractures or osteoporosis should discuss screening with their health care providers. Ask your health care provider whether you should take a calcium supplement or vitamin D to reduce the rate of osteoporosis.  Menopause can be associated with physical symptoms and risks. Hormone replacement therapy is available to decrease symptoms and risks. You should talk to your health care provider about whether hormone replacement therapy is right for you.  Use sunscreen. Apply sunscreen liberally and repeatedly throughout the day. You should seek shade when your shadow is shorter than you. Protect yourself by wearing long sleeves, pants, a wide-brimmed hat, and sunglasses year round, whenever you are outdoors.  Once a month, do a whole body skin exam, using a mirror to look at the skin on your back. Tell your health care provider of new moles, moles that have irregular borders, moles that  are larger than a pencil eraser, or moles that have changed in shape or color.  Stay current with required vaccines (immunizations).  Influenza vaccine. All adults should be immunized every year.  Tetanus, diphtheria, and acellular pertussis (Td, Tdap) vaccine. Pregnant women should receive 1 dose of Tdap vaccine during each pregnancy. The dose should be obtained regardless of the length of time since the last dose. Immunization is preferred during the 27th-36th week of gestation. An adult who has not previously received Tdap or who does not know her vaccine status should receive 1 dose of Tdap. This initial dose should be followed by tetanus and diphtheria toxoids (Td) booster doses every 10 years. Adults with an unknown or incomplete history of completing a 3-dose immunization series with Td-containing vaccines should begin or complete a primary immunization series including a Tdap dose. Adults should receive a Td booster every 10 years.  Varicella vaccine. An adult without evidence of immunity to varicella should receive 2 doses or a second dose if she has previously received 1 dose. Pregnant females who do not have evidence of immunity should receive the first dose after pregnancy. This first dose should be obtained before leaving the health care facility. The second dose should be obtained 4-8 weeks after the first dose.  Human papillomavirus (HPV) vaccine. Females aged 13-26 years who have not received the vaccine previously should obtain the 3-dose series. The vaccine is not recommended for use in pregnant females. However, pregnancy testing is not needed before receiving a dose. If a female is found to be pregnant after receiving a dose, no treatment is needed. In that case, the remaining doses should be delayed until after the pregnancy. Immunization is recommended for any person with an immunocompromised condition through the age of 61 years if she did not get any or all doses earlier. During the  3-dose series, the second dose should be obtained 4-8 weeks after the first dose. The third dose should be obtained 24 weeks after the first dose and 16 weeks after the second dose.  Zoster vaccine. One dose is recommended for adults aged 30 years or older unless certain conditions are present.  Measles, mumps, and rubella (MMR) vaccine. Adults born  before 1957 generally are considered immune to measles and mumps. Adults born in 1957 or later should have 1 or more doses of MMR vaccine unless there is a contraindication to the vaccine or there is laboratory evidence of immunity to each of the three diseases. A routine second dose of MMR vaccine should be obtained at least 28 days after the first dose for students attending postsecondary schools, health care workers, or international travelers. People who received inactivated measles vaccine or an unknown type of measles vaccine during 1963-1967 should receive 2 doses of MMR vaccine. People who received inactivated mumps vaccine or an unknown type of mumps vaccine before 1979 and are at high risk for mumps infection should consider immunization with 2 doses of MMR vaccine. For females of childbearing age, rubella immunity should be determined. If there is no evidence of immunity, females who are not pregnant should be vaccinated. If there is no evidence of immunity, females who are pregnant should delay immunization until after pregnancy. Unvaccinated health care workers born before 1957 who lack laboratory evidence of measles, mumps, or rubella immunity or laboratory confirmation of disease should consider measles and mumps immunization with 2 doses of MMR vaccine or rubella immunization with 1 dose of MMR vaccine.  Pneumococcal 13-valent conjugate (PCV13) vaccine. When indicated, a person who is uncertain of his immunization history and has no record of immunization should receive the PCV13 vaccine. All adults 65 years of age and older should receive this  vaccine. An adult aged 19 years or older who has certain medical conditions and has not been previously immunized should receive 1 dose of PCV13 vaccine. This PCV13 should be followed with a dose of pneumococcal polysaccharide (PPSV23) vaccine. Adults who are at high risk for pneumococcal disease should obtain the PPSV23 vaccine at least 8 weeks after the dose of PCV13 vaccine. Adults older than 30 years of age who have normal immune system function should obtain the PPSV23 vaccine dose at least 1 year after the dose of PCV13 vaccine.  Pneumococcal polysaccharide (PPSV23) vaccine. When PCV13 is also indicated, PCV13 should be obtained first. All adults aged 65 years and older should be immunized. An adult younger than age 65 years who has certain medical conditions should be immunized. Any person who resides in a nursing home or long-term care facility should be immunized. An adult smoker should be immunized. People with an immunocompromised condition and certain other conditions should receive both PCV13 and PPSV23 vaccines. People with human immunodeficiency virus (HIV) infection should be immunized as soon as possible after diagnosis. Immunization during chemotherapy or radiation therapy should be avoided. Routine use of PPSV23 vaccine is not recommended for American Indians, [place] Natives, or people younger than 65 years unless there are medical conditions that require PPSV23 vaccine. When indicated, people who have unknown immunization and have no record of immunization should receive PPSV23 vaccine. One-time revaccination 5 years after the first dose of PPSV23 is recommended for people aged 19-64 years who have chronic kidney failure, nephrotic syndrome, asplenia, or immunocompromised conditions. People who received 1-2 doses of PPSV23 before age 65 years should receive another dose of PPSV23 vaccine at age 65 years or later if at least 5 years have passed since the previous dose. Doses of PPSV23 are not  needed for people immunized with PPSV23 at or after age 65 years.  Meningococcal vaccine. Adults with asplenia or persistent complement component deficiencies should receive 2 doses of quadrivalent meningococcal conjugate (MenACWY-D) vaccine. The doses should be obtained   at least 2 months apart. Microbiologists working with certain meningococcal bacteria, [employer] recruits, people at risk during an outbreak, and people who travel to or live in countries with a high rate of meningitis should be immunized. A first-year college student up through age 61 years who is living in a residence hall should receive a dose if she did not receive a dose on or after her 16th birthday. Adults who have certain high-risk conditions should receive one or more doses of vaccine.  Hepatitis A vaccine. Adults who wish to be protected from this disease, have certain high-risk conditions, work with hepatitis A-infected animals, work in hepatitis A research labs, or travel to or work in countries with a high rate of hepatitis A should be immunized. Adults who were previously unvaccinated and who anticipate close contact with an international adoptee during the first 60 days after arrival in the [place] from a country with a high rate of hepatitis A should be immunized.  Hepatitis B vaccine. Adults who wish to be protected from this disease, have certain high-risk conditions, may be exposed to blood or other infectious body fluids, are household contacts or sex partners of hepatitis B positive people, are clients or workers in certain care facilities, or travel to or work in countries with a high rate of hepatitis B should be immunized.  Haemophilus influenzae type b (Hib) vaccine. A previously unvaccinated person with asplenia or sickle cell disease or having a scheduled splenectomy should receive 1 dose of Hib vaccine. Regardless of previous immunization, a recipient of a hematopoietic stem cell transplant should receive a  3-dose series 6-12 months after her successful transplant. Hib vaccine is not recommended for adults with HIV infection. Preventive Services / Frequency Ages 96 to 20 years  Blood pressure check.** / Every 3-5 years.  Lipid and cholesterol check.** / Every 5 years beginning at age 65.  Clinical breast exam.** / Every 3 years for women in their 31s and 51s.  BRCA-related cancer risk assessment.** / For women who have family members with a BRCA-related cancer (breast, ovarian, tubal, or peritoneal cancers).  Pap test.** / Every 2 years from ages 75 through 50. Every 3 years starting at age 15 through age 38 or 55 with a history of 3 consecutive normal Pap tests.  HPV screening.** / Every 3 years from ages 81 through ages 11 to 30 with a history of 3 consecutive normal Pap tests.  Hepatitis C blood test.** / For any individual with known risks for hepatitis C.  Skin self-exam. / Monthly.  Influenza vaccine. / Every year.  Tetanus, diphtheria, and acellular pertussis (Tdap, Td) vaccine.** / Consult your health care provider. Pregnant women should receive 1 dose of Tdap vaccine during each pregnancy. 1 dose of Td every 10 years.  Varicella vaccine.** / Consult your health care provider. Pregnant females who do not have evidence of immunity should receive the first dose after pregnancy.  HPV vaccine. / 3 doses over 6 months, if 64 and younger. The vaccine is not recommended for use in pregnant females. However, pregnancy testing is not needed before receiving a dose.  Measles, mumps, rubella (MMR) vaccine.** / You need at least 1 dose of MMR if you were born in 1957 or later. You may also need a 2nd dose. For females of childbearing age, rubella immunity should be determined. If there is no evidence of immunity, females who are not pregnant should be vaccinated. If there is no evidence of immunity, females who are  pregnant should delay immunization until after pregnancy.  Pneumococcal  13-valent conjugate (PCV13) vaccine.** / Consult your health care provider.  Pneumococcal polysaccharide (PPSV23) vaccine.** / 1 to 2 doses if you smoke cigarettes or if you have certain conditions.  Meningococcal vaccine.** / 1 dose if you are age 68 to 8 years and a Market researcher living in a residence hall, or have one of several medical conditions, you need to get vaccinated against meningococcal disease. You may also need additional booster doses.  Hepatitis A vaccine.** / Consult your health care provider.  Hepatitis B vaccine.** / Consult your health care provider.  Haemophilus influenzae type b (Hib) vaccine.** / Consult your health care provider. Ages 7 to 53 years  Blood pressure check.** / Every year.  Lipid and cholesterol check.** / Every 5 years beginning at age 25 years.  Lung cancer screening. / Every year if you are aged 11-80 years and have a 30-pack-year history of smoking and currently smoke or have quit within the past 15 years. Yearly screening is stopped once you have quit smoking for at least 15 years or develop a health problem that would prevent you from having lung cancer treatment.  Clinical breast exam.** / Every year after age 48 years.  BRCA-related cancer risk assessment.** / For women who have family members with a BRCA-related cancer (breast, ovarian, tubal, or peritoneal cancers).  Mammogram.** / Every year beginning at age 41 years and continuing for as long as you are in good health. Consult with your health care provider.  Pap test.** / Every 3 years starting at age 65 years through age 37 or 70 years with a history of 3 consecutive normal Pap tests.  HPV screening.** / Every 3 years from ages 72 years through ages 60 to 40 years with a history of 3 consecutive normal Pap tests.  Fecal occult blood test (FOBT) of stool. / Every year beginning at age 21 years and continuing until age 5 years. You may not need to do this test if you get  a colonoscopy every 10 years.  Flexible sigmoidoscopy or colonoscopy.** / Every 5 years for a flexible sigmoidoscopy or every 10 years for a colonoscopy beginning at age 35 years and continuing until age 48 years.  Hepatitis C blood test.** / For all people born from [birth year] through 1965 and any individual with known risks for hepatitis C.  Skin self-exam. / Monthly.  Influenza vaccine. / Every year.  Tetanus, diphtheria, and acellular pertussis (Tdap/Td) vaccine.** / Consult your health care provider. Pregnant women should receive 1 dose of Tdap vaccine during each pregnancy. 1 dose of Td every 10 years.  Varicella vaccine.** / Consult your health care provider. Pregnant females who do not have evidence of immunity should receive the first dose after pregnancy.  Zoster vaccine.** / 1 dose for adults aged 30 years or older.  Measles, mumps, rubella (MMR) vaccine.** / You need at least 1 dose of MMR if you were born in 1957 or later. You may also need a second dose. For females of childbearing age, rubella immunity should be determined. If there is no evidence of immunity, females who are not pregnant should be vaccinated. If there is no evidence of immunity, females who are pregnant should delay immunization until after pregnancy.  Pneumococcal 13-valent conjugate (PCV13) vaccine.** / Consult your health care provider.  Pneumococcal polysaccharide (PPSV23) vaccine.** / 1 to 2 doses if you smoke cigarettes or if you have certain conditions.  Meningococcal vaccine.** /  Consult your health care provider.  Hepatitis A vaccine.** / Consult your health care provider.  Hepatitis B vaccine.** / Consult your health care provider.  Haemophilus influenzae type b (Hib) vaccine.** / Consult your health care provider. Ages 66 years and over  Blood pressure check.** / Every year.  Lipid and cholesterol check.** / Every 5 years beginning at age 87 years.  Lung cancer screening. / Every year if you  are aged 60-80 years and have a 30-pack-year history of smoking and currently smoke or have quit within the past 15 years. Yearly screening is stopped once you have quit smoking for at least 15 years or develop a health problem that would prevent you from having lung cancer treatment.  Clinical breast exam.** / Every year after age 44 years.  BRCA-related cancer risk assessment.** / For women who have family members with a BRCA-related cancer (breast, ovarian, tubal, or peritoneal cancers).  Mammogram.** / Every year beginning at age 53 years and continuing for as long as you are in good health. Consult with your health care provider.  Pap test.** / Every 3 years starting at age 48 years through age 41 or 83 years with 3 consecutive normal Pap tests. Testing can be stopped between 65 and 70 years with 3 consecutive normal Pap tests and no abnormal Pap or HPV tests in the past 10 years.  HPV screening.** / Every 3 years from ages 60 years through ages 85 or 57 years with a history of 3 consecutive normal Pap tests. Testing can be stopped between 65 and 70 years with 3 consecutive normal Pap tests and no abnormal Pap or HPV tests in the past 10 years.  Fecal occult blood test (FOBT) of stool. / Every year beginning at age 77 years and continuing until age 67 years. You may not need to do this test if you get a colonoscopy every 10 years.  Flexible sigmoidoscopy or colonoscopy.** / Every 5 years for a flexible sigmoidoscopy or every 10 years for a colonoscopy beginning at age 67 years and continuing until age 5 years.  Hepatitis C blood test.** / For all people born from [birth year] through 1966 and any individual with known risks for hepatitis C.  Osteoporosis screening.** / A one-time screening for women ages 27 years and over and women at risk for fractures or osteoporosis.  Skin self-exam. / Monthly.  Influenza vaccine. / Every year.  Tetanus, diphtheria, and acellular pertussis (Tdap/Td)  vaccine.** / 1 dose of Td every 10 years.  Varicella vaccine.** / Consult your health care provider.  Zoster vaccine.** / 1 dose for adults aged 70 years or older.  Pneumococcal 13-valent conjugate (PCV13) vaccine.** / Consult your health care provider.  Pneumococcal polysaccharide (PPSV23) vaccine.** / 1 dose for all adults aged 11 years and older.  Meningococcal vaccine.** / Consult your health care provider.  Hepatitis A vaccine.** / Consult your health care provider.  Hepatitis B vaccine.** / Consult your health care provider.  Haemophilus influenzae type b (Hib) vaccine.** / Consult your health care provider. ** Family history and personal history of risk and conditions may change your health care provider's recommendations.   This information is not intended to replace advice given to you by your health care provider. Make sure you discuss any questions you have with your health care provider.   Document Released: 04/13/2001 Document Revised: 03/08/2014 Document Reviewed: 07/13/2010 Elsevier Interactive Patient Education [employer].

## 2015-01-31 NOTE — Progress Notes (Signed)
Pre visit review using our clinic review tool, if applicable. No additional management support is needed unless otherwise documented below in the visit note. 

## 2015-02-02 ENCOUNTER — Encounter: Payer: Self-pay | Admitting: Family Medicine

## 2015-02-02 DIAGNOSIS — E782 Mixed hyperlipidemia: Secondary | ICD-10-CM

## 2015-02-02 HISTORY — DX: Mixed hyperlipidemia: E78.2

## 2015-02-02 NOTE — Assessment & Plan Note (Signed)
Recurrent this fall. Started on antibiotics and steroids. May need further fi continues to recur. Encouraged increased rest and hydration, add probiotics, zinc such as Coldeze or Xicam. Treat fevers as needed. Consider Elderberry, vitamin C and garlic.

## 2015-02-02 NOTE — Assessment & Plan Note (Signed)
Tolerating statin, encouraged heart healthy diet, avoid trans fats, minimize simple carbs and saturated fats. Increase exercise as tolerated 

## 2015-02-02 NOTE — Assessment & Plan Note (Signed)
Encouraged daily probiotics and fiber supplements, is doing better.

## 2015-02-02 NOTE — Assessment & Plan Note (Signed)
Patient is requesting transfer of her care to [facility]. Will arrange. Continue current meds for now

## 2015-02-02 NOTE — Assessment & Plan Note (Signed)
Avoid offending foods, start probiotics. Do not eat large meals in late evening and consider raising head of bed.  

## 2015-02-02 NOTE — Progress Notes (Signed)
Subjective:    Patient ID: Meagan Chung, female    DOB: February 13, 1986, 29 y.o.   MRN: [id number]  Chief Complaint  Patient presents with  . Establish Care  . Pneumonia    HPI Patient is in today for official new patient appointment and to follow-up on pneumonia. She has been struggling with respiratory symptoms again for the past week. She's had intermittent episodes of sore throat, fevers, chills, headaches, malaise, myalgias, nasal congestion, chest congestion, postnasal drip with a cough that is productive of thick green phlegm at this time. She has tried Sudafed and Advil with minimal improvement. She was seen in urgent care but no medications were given. Denies CP/palp/GI or GU c/o. Taking meds as prescribed  Past Medical History  Diagnosis Date  . Asthma   . Depression     anxiety, bulemia in past all well controlled  . Thyroid nodule   . Diarrhea 11/29/2014  . Hyperlipidemia, mixed 02/02/2015  . Anxiety and depression     anxiety, bulemia in past all well controlled     Past Surgical History  Procedure Laterality Date  . Ankle surgery    . Wisdom tooth extraction Bilateral     Family History  Problem Relation Age of Onset  . Stroke Father     2  . Hyperlipidemia Father   . Anorexia nervosa Sister     66  . Kidney Stones Sister   . Thyroid disease Brother   . Obesity Brother   . Dementia Maternal Grandmother   . Osteoporosis Maternal Grandmother   . Cancer Maternal Grandmother     breast  . Cancer Maternal Grandfather     prostate  . Alcohol abuse Maternal Grandfather   . Cancer Paternal Grandmother 28    3 masses in pancreas and thyroid, suspect cancer  . Hyperlipidemia Paternal Grandmother     Social History   Social History  . Marital Status: Single    Spouse Name: N/A  . Number of Children: 0  . Years of Education: N/A   Occupational History  . development    Social History Main Topics  . Smoking status: Never Smoker   . Smokeless tobacco: Not  on file  . Alcohol Use: 0.0 oz/week    0 Standard drinks or equivalent per week     Comment: rarely  . Drug Use: No  . Sexual Activity: Yes     Comment: lives with fiance, fund raise for [employer], avoids gluten, minimize dairy,   Other Topics Concern  . Not on file   Social History Narrative    Outpatient Prescriptions Prior to Visit  Medication Sig Dispense Refill  . albuterol (PROVENTIL HFA;VENTOLIN HFA) 108 (90 BASE) MCG/ACT inhaler Inhale 1-2 puffs into the lungs every 6 (six) hours as needed for wheezing or shortness of breath.    . cetirizine (ZYRTEC) 10 MG tablet Take 10 mg by mouth at bedtime.    . clonazePAM (KLONOPIN) 0.5 MG tablet Take 0.5-1 mg by mouth at bedtime.    Marland Kitchen ipratropium-albuterol (DUONEB) 0.5-2.5 (3) MG/3ML SOLN Take 3 mLs by nebulization every 4 (four) hours as needed.    . mometasone-formoterol (DULERA) 200-5 MCG/ACT AERO Take 2 puffs first thing in am and then another 2 puffs about 12 hours later.    . Thyroid (NATURE-THROID) 48.75 MG TABS Take 48.75 mg by mouth 2 (two) times daily.    . valACYclovir (VALTREX) 500 MG tablet 1 tab po bid x 3 days then 1 tab  po daily 35 tablet 3  . lamoTRIgine (LAMICTAL) 200 MG tablet Take 300 mg by mouth at bedtime.    . citalopram (CELEXA) 40 MG tablet Take 40 mg by mouth at bedtime.    . famotidine (PEPCID) 40 MG tablet Take 1 tablet (40 mg total) by mouth at bedtime. 30 tablet 1  . fluconazole (DIFLUCAN) 150 MG tablet 1 tab po daily x 3 days then 1 tab po weekly x 4 weeks 7 tablet 1  . pantoprazole (PROTONIX) 40 MG tablet Take 1 tablet (40 mg total) by mouth daily. 30 tablet 1  . predniSONE (DELTASONE) 20 MG tablet Take 3 tablets by mouth daily X 2 days; then 2 tablets by mouth daily X 2 days; then 1 tablet by mouth daily X 3 days; then 1/2 tablet by mouth daily X 3 days and stop prednisone 18 tablet 0   No facility-administered medications prior to visit.    Allergies  Allergen Reactions  . Other Hives     Capers causes SOB Cats 4+    Review of Systems  Constitutional: Positive for malaise/fatigue. Negative for fever and chills.  HENT: Positive for congestion. Negative for hearing loss.   Eyes: Negative for discharge.  Respiratory: Positive for cough, sputum production, shortness of breath and wheezing.   Cardiovascular: Negative for chest pain, palpitations and leg swelling.  Gastrointestinal: Negative for heartburn, nausea, vomiting, abdominal pain, diarrhea, constipation and blood in stool.  Genitourinary: Negative for dysuria, urgency, frequency and hematuria.  Musculoskeletal: Positive for myalgias. Negative for back pain and falls.  Skin: Negative for rash.  Neurological: Positive for headaches. Negative for dizziness, sensory change, loss of consciousness and weakness.  Endo/Heme/Allergies: Negative for environmental allergies. Does not bruise/bleed easily.  Psychiatric/Behavioral: Negative for depression and suicidal ideas. The patient is not nervous/anxious and does not have insomnia.        Objective:    Physical Exam  Constitutional: She is oriented to person, place, and time. She appears well-developed and well-nourished. No distress.  HENT:  Head: Normocephalic and atraumatic.  Eyes: Conjunctivae are normal.  Neck: Neck supple. No thyromegaly present.  Cardiovascular: Normal rate, regular rhythm and normal heart sounds.   No murmur heard. Pulmonary/Chest: Effort normal and breath sounds normal. No respiratory distress.  Decreased BS b/l bases  Abdominal: Soft. Bowel sounds are normal. She exhibits no distension and no mass. There is no tenderness.  Musculoskeletal: She exhibits no edema.  Lymphadenopathy:    She has no cervical adenopathy.  Neurological: She is alert and oriented to person, place, and time.  Skin: Skin is warm and dry.  Psychiatric: She has a normal mood and affect. Her behavior is normal.    BP 118/72 mmHg  Pulse 76  Temp(Src) 98.8 F (37.1 C)  (Oral)  Ht 5\' 6"  (1.676 m)  Wt 176 lb (79.833 kg)  BMI 28.42 kg/m2  SpO2 98%  LMP 01/09/2015 Wt Readings from Last 3 Encounters:  01/31/15 176 lb (79.833 kg)  11/25/14 182 lb 9.6 oz (82.827 kg)  11/22/14 187 lb 12.8 oz (85.186 kg)     Lab Results  Component Value Date   WBC 6.6 01/31/2015   HGB 13.8 01/31/2015   HCT 41.6 01/31/2015   PLT 305.0 01/31/2015   GLUCOSE 80 01/31/2015   CHOL 155 01/31/2015   TRIG 44.0 01/31/2015   HDL 45.90 01/31/2015   LDLCALC 100* 01/31/2015   ALT 13 01/31/2015   AST 14 01/31/2015   NA 139 01/31/2015   K  4.4 01/31/2015   CL 106 01/31/2015   CREATININE 0.79 01/31/2015   BUN 13 01/31/2015   CO2 26 01/31/2015   TSH 1.71 01/31/2015   HGBA1C 5.2 01/31/2015    Lab Results  Component Value Date   TSH 1.71 01/31/2015   Lab Results  Component Value Date   WBC 6.6 01/31/2015   HGB 13.8 01/31/2015   HCT 41.6 01/31/2015   MCV 89.1 01/31/2015   PLT 305.0 01/31/2015   Lab Results  Component Value Date   NA 139 01/31/2015   K 4.4 01/31/2015   CO2 26 01/31/2015   GLUCOSE 80 01/31/2015   BUN 13 01/31/2015   CREATININE 0.79 01/31/2015   BILITOT 0.6 01/31/2015   ALKPHOS 77 01/31/2015   AST 14 01/31/2015   ALT 13 01/31/2015   PROT 7.5 01/31/2015   ALBUMIN 4.5 01/31/2015   CALCIUM 9.4 01/31/2015   ANIONGAP 7 11/11/2014   GFR 90.49 01/31/2015   Lab Results  Component Value Date   CHOL 155 01/31/2015   Lab Results  Component Value Date   HDL 45.90 01/31/2015   Lab Results  Component Value Date   LDLCALC 100* 01/31/2015   Lab Results  Component Value Date   TRIG 44.0 01/31/2015   Lab Results  Component Value Date   CHOLHDL 3 01/31/2015   Lab Results  Component Value Date   HGBA1C 5.2 01/31/2015       Assessment & Plan:   Problem List Items Addressed This Visit    CAP (community acquired pneumonia) - Primary    Recurrent this fall. Started on antibiotics and steroids. May need further fi continues to recur. Encouraged  increased rest and hydration, add probiotics, zinc such as Coldeze or Xicam. Treat fevers as needed. Consider Elderberry, vitamin C and garlic.       Relevant Medications   methylPREDNISolone (MEDROL) 4 MG tablet   levofloxacin (LEVAQUIN) 500 MG tablet   Other Relevant Orders   TSH (Completed)   CBC (Completed)   Lipid panel (Completed)   Comprehensive metabolic panel (Completed)   Hemoglobin A1c (Completed)   DG Chest 2 View (Completed)   Chronic asthma without complication    Patient is requesting transfer of her care to [facility]. Will arrange. Continue current meds for now      Relevant Medications   methylPREDNISolone (MEDROL) 4 MG tablet   Diarrhea    Encouraged daily probiotics and fiber supplements, is doing better.      Esophageal reflux    Avoid offending foods, start probiotics. Do not eat large meals in late evening and consider raising head of bed.       Hyperlipidemia, mixed    Tolerating statin, encouraged heart healthy diet, avoid trans fats, minimize simple carbs and saturated fats. Increase exercise as tolerated       Other Visit Diagnoses    Cervical cancer screening        Relevant Orders    TSH (Completed)    CBC (Completed)    Lipid panel (Completed)    Comprehensive metabolic panel (Completed)    Hemoglobin A1c (Completed)    Ambulatory referral to Obstetrics / Gynecology    Hyperglycemia        Relevant Orders    TSH (Completed)    CBC (Completed)    Lipid panel (Completed)    Comprehensive metabolic panel (Completed)    Hemoglobin A1c (Completed)    Leukocytosis        Relevant Orders    TSH (  Completed)    CBC (Completed)    Lipid panel (Completed)    Comprehensive metabolic panel (Completed)    Hemoglobin A1c (Completed)    Asthma, unspecified asthma severity, uncomplicated        Relevant Medications    methylPREDNISolone (MEDROL) 4 MG tablet    Other Relevant Orders    Ambulatory referral to Pulmonology       I have discontinued  Ms. Sayers's citalopram, pantoprazole, famotidine, predniSONE, and fluconazole. I am also having her start on methylPREDNISolone and levofloxacin. Additionally, I am having her maintain her Thyroid, clonazePAM, cetirizine, albuterol, ipratropium-albuterol, mometasone-formoterol, valACYclovir, FLUoxetine, and lamoTRIgine.  Meds ordered this encounter  Medications  . FLUoxetine (PROZAC) 20 MG capsule    Sig: Take 20 mg by mouth daily.  Marland Kitchen lamoTRIgine (LAMICTAL) 100 MG tablet    Sig: Take 100 mg by mouth at bedtime.  . methylPREDNISolone (MEDROL) 4 MG tablet    Sig: 6 tabs po x 1d then 5 tabs po x 1 d then 4 tabs po x 1 day then 3 tabs po x 1 day then 2 tabs x 1 day then 1 tab x 1 day    Dispense:  21 tablet    Refill:  1  . levofloxacin (LEVAQUIN) 500 MG tablet    Sig: Take 1 tablet (500 mg total) by mouth daily.    Dispense:  7 tablet    Refill:  0     Penni Homans, MD

## 2015-02-04 ENCOUNTER — Telehealth: Payer: Self-pay | Admitting: Internal Medicine

## 2015-02-04 NOTE — Telephone Encounter (Signed)
RA please advise if you're ok with taking on this patient.  Thanks!

## 2015-02-04 NOTE — Telephone Encounter (Signed)
Fine with me

## 2015-02-04 NOTE — Telephone Encounter (Signed)
Spoke with pt. Advised her of our office policy about switching MD's. She verbalized understanding.  MW - please advise if pt can switch to RA in [place]. Thanks.

## 2015-02-04 NOTE — Telephone Encounter (Signed)
lmtcb X1 for pt. Next available appt with RA in [place] is on 05/01/15

## 2015-02-04 NOTE — Telephone Encounter (Signed)
Okay with me for routine follow-up appointment

## 2015-02-05 NOTE — Telephone Encounter (Signed)
atc pt, line rang three times and went to fast busy signal.  Wcb.

## 2015-02-06 NOTE — Telephone Encounter (Signed)
Spoke with pt. She is fine with waiting until March 2017. OV has been scheduled for 05/01/15 at 4pm. Nothing further was needed at this time.

## 2015-02-07 ENCOUNTER — Telehealth: Payer: Self-pay | Admitting: Family Medicine

## 2015-02-07 NOTE — Telephone Encounter (Signed)
Patient will be getting Flu Shot tomorrow 12/10 @ [facility]. States Dr B. Advised her to wait until this weekend to get it because she had been sick.

## 2015-03-02 NOTE — L&D Delivery Note (Addendum)
Delivery Note At 11:12 PM a viable female was delivered via Vaginal, Spontaneous Delivery (Presentation: OA).  APGAR: pending, ; weight  pending Placenta status: to pathology  with the following complications: nuchal cord and chorioamnionitis, s/p amp/gent x 1 dose.  Cord pH: done but not enough in sample for interpretation  Anesthesia:   Episiotomy: None Lacerations: 2nd degree;Labial Suture Repair: 2.0 3.0 Est. Blood Loss (mL):  300cc It's a girl, "madeleine Ann"!! Mom to postpartum.  Baby to Couplet care / Skin to Skin.  Colin Benton Amri Lien 02/01/2016, 12:08 AM

## 2015-03-21 ENCOUNTER — Encounter: Payer: Self-pay | Admitting: Medical

## 2015-03-21 ENCOUNTER — Ambulatory Visit (INDEPENDENT_AMBULATORY_CARE_PROVIDER_SITE_OTHER): Payer: Managed Care, Other (non HMO) | Admitting: Medical

## 2015-03-21 VITALS — BP 104/72 | HR 78 | Temp 98.3°F | Ht 66.0 in | Wt 178.4 lb

## 2015-03-21 DIAGNOSIS — R062 Wheezing: Secondary | ICD-10-CM

## 2015-03-21 DIAGNOSIS — J4541 Moderate persistent asthma with (acute) exacerbation: Secondary | ICD-10-CM

## 2015-03-21 MED ORDER — PREDNISONE 10 MG PO TABS: ORAL_TABLET | ORAL | Status: DC

## 2015-03-21 MED ORDER — FLUTICASONE-SALMETEROL 100-50 MCG/DOSE IN AEPB: 1.0000 | INHALATION_SPRAY | Freq: Two times a day (BID) | RESPIRATORY_TRACT | Status: DC

## 2015-03-21 NOTE — Progress Notes (Signed)
Pre visit review using our clinic review tool, if applicable. No additional management support is needed unless otherwise documented below in the visit note. 

## 2015-03-21 NOTE — Progress Notes (Signed)
Subjective:    Patient ID: Meagan Chung, female    DOB: 1985/11/12, 30 y.o.   MRN: [id number]  HPI   Pt in states she had some wheezing recently. She was out of town staying in a house at [employer] past weekend. Pt not sure if she had exposure to cat hair. She was staying at rental. Pt did not have reaction to medicine(someon put this in chief complaint). She describes that she would use albuterol due to wheeze but then would have to use it every 4-6 hours. She has pump inhaler and has nebulizer treatment. Pt wanted to be seen before week coming up. Has dry cough with wheeze. No fever, no chills or sweats. No leg pain.   Pt has some nausea last 2 days. No diarrhea, no abdomen pain. LMP- March 07, 2015. Came on when she expectedand normal. . Pt not on any ocp. No gerd type symptoms. Pt declined pregnancy test. Pt very stressed recenlty.  No vomiting. No uti type symptoms.  Pt is on dulera. Not good coverage. Advair is better covered. Pt has 20 days left on dulera. Pt will see pulmonologist in March.      Review of Systems  Constitutional: Negative for fever, chills and fatigue.  HENT: Negative for congestion, ear discharge, nosebleeds, postnasal drip and sinus pressure.   Respiratory: Positive for shortness of breath and wheezing. Negative for cough and chest tightness.        When albuterol runs out.  Cardiovascular: Negative for chest pain and palpitations.  Musculoskeletal: Negative for back pain.  Skin: Negative for pallor and rash.  Neurological: Negative for dizziness, seizures, speech difficulty, weakness, light-headedness, numbness and headaches.  Hematological: Negative for adenopathy. Does not bruise/bleed easily.  Psychiatric/Behavioral: Negative for behavioral problems and confusion.       Does report recent stress.    Past Medical History  Diagnosis Date  . Asthma   . Depression     anxiety, bulemia in past all well controlled  . Thyroid nodule   . Diarrhea 11/29/2014   . Hyperlipidemia, mixed 02/02/2015  . Anxiety and depression     anxiety, bulemia in past all well controlled     Social History   Social History  . Marital Status: Single    Spouse Name: N/A  . Number of Children: 0  . Years of Education: N/A   Occupational History  . development    Social History Main Topics  . Smoking status: Never Smoker   . Smokeless tobacco: Not on file  . Alcohol Use: 0.0 oz/week    0 Standard drinks or equivalent per week     Comment: rarely  . Drug Use: No  . Sexual Activity: Yes     Comment: lives with fiance, fund raise for [employer], avoids gluten, minimize dairy,   Other Topics Concern  . Not on file   Social History Narrative    Past Surgical History  Procedure Laterality Date  . Ankle surgery    . Wisdom tooth extraction Bilateral     Family History  Problem Relation Age of Onset  . Stroke Father     2  . Hyperlipidemia Father   . Anorexia nervosa Sister     74  . Kidney Stones Sister   . Thyroid disease Brother   . Obesity Brother   . Dementia Maternal Grandmother   . Osteoporosis Maternal Grandmother   . Cancer Maternal Grandmother     breast  . Cancer Maternal  Grandfather     prostate  . Alcohol abuse Maternal Grandfather   . Cancer Paternal Grandmother 62    3 masses in pancreas and thyroid, suspect cancer  . Hyperlipidemia Paternal Grandmother     Allergies  Allergen Reactions  . Other Hives    Capers causes SOB Cats 4+    Current Outpatient Prescriptions on File Prior to Visit  Medication Sig Dispense Refill  . albuterol (PROVENTIL HFA;VENTOLIN HFA) 108 (90 BASE) MCG/ACT inhaler Inhale 1-2 puffs into the lungs every 6 (six) hours as needed for wheezing or shortness of breath.    . cetirizine (ZYRTEC) 10 MG tablet Take 10 mg by mouth at bedtime.    . clonazePAM (KLONOPIN) 0.5 MG tablet Take 0.5-1 mg by mouth at bedtime.    Marland Kitchen FLUoxetine (PROZAC) 20 MG capsule Take 20 mg by mouth daily.    Marland Kitchen  ipratropium-albuterol (DUONEB) 0.5-2.5 (3) MG/3ML SOLN Take 3 mLs by nebulization every 4 (four) hours as needed.    . mometasone-formoterol (DULERA) 200-5 MCG/ACT AERO Take 2 puffs first thing in am and then another 2 puffs about 12 hours later.    . valACYclovir (VALTREX) 500 MG tablet 1 tab po bid x 3 days then 1 tab po daily 35 tablet 3   No current facility-administered medications on file prior to visit.    BP 104/72 mmHg  Pulse 78  Temp(Src) 98.3 F (36.8 C) (Oral)  Ht 5\' 6"  (1.676 m)  Wt 178 lb 6.4 oz (80.922 kg)  BMI 28.81 kg/m2  SpO2 98%       Objective:   Physical Exam  General  Mental Status - Alert. General Appearance - Well groomed. Not in acute distress.  Skin Rashes- No Rashes.  HEENT Head- Normal. Eye Sclera/Conjunctiva- Left- Normal. Right- Normal. Nose & Sinuses Nasal Mucosa- Left-  Not boggy or Congested. Right-  Not  boggy or Congested. Mouth & Throat Lips: Upper Lip- Normal: no dryness, cracking, pallor, cyanosis, or vesicular eruption. Lower Lip-Normal: no dryness, cracking, pallor, cyanosis or vesicular eruption. Buccal Mucosa- Bilateral- No Aphthous ulcers. Oropharynx- No Discharge or Erythema. Tonsils: Characteristics- Bilateral- No Erythema or Congestion. Size/Enlargement- Bilateral- No enlargement. Discharge- bilateral-None.  Neck Neck- Supple. No Masses.   Chest and Lung Exam Auscultation: Breath Sounds:- even and unlabored.  Cardiovascular Auscultation:Rythm- Regular, rate and rhythm. Murmurs & Other Heart Sounds:Ausculatation of the heart reveal- No Murmurs.  Lymphatic Head & Neck General Head & Neck Lymphatics: Bilateral: Description- No Localized lymphadenopathy.  Lower ext- negative homans signs. No pedal edema. Calfs look symmetric.       Assessment & Plan:  With recent wheezing symptoms and history of asthma exacerbation in December when you had pneumonia, I do think good idea to start tapered prednisone and continue  your inhalers. If you don't experience significant improvement by Monday let us know and would get chest xray.  Follow up in 7-10 days or as needed(any persisting or worsening signs or symptoms)  I did send advair inhaler to your pharmacy that you can start when dulera runs out. Then discuss plans with pulmonologist when you see him in the spring.  Regarding nausea. Discussed with pt. She declined preg test. Does not describe serious nausea now. Advised pt to update if this worsens or any signs or symptoms that accompany as this may direct work up.

## 2015-03-21 NOTE — Patient Instructions (Addendum)
With recent wheezing symptoms and history of asthma exacerbation in December when you had pneumonia, I do think good idea to start tapered prednisone and continue your inhalers. If you don't experience significant improvement by Monday let us know and would get chest xray.  Follow up in 7-10 days or as needed(any persisting or worsening signs or symptoms)  I did send advair inhaler to your pharmacy that you can start when dulera runs out. Then discuss plans with pulmonologist when you see him in the spring.

## 2015-04-08 ENCOUNTER — Telehealth: Payer: Self-pay | Admitting: Family Medicine

## 2015-04-08 NOTE — Telephone Encounter (Signed)
Documented in health maintenance.

## 2015-04-08 NOTE — Telephone Encounter (Signed)
Patient declined receiving flu shot  °

## 2015-05-01 ENCOUNTER — Encounter: Payer: Self-pay | Admitting: Pulmonary Disease

## 2015-05-01 ENCOUNTER — Ambulatory Visit (INDEPENDENT_AMBULATORY_CARE_PROVIDER_SITE_OTHER): Payer: Managed Care, Other (non HMO) | Admitting: Pulmonary Disease

## 2015-05-01 VITALS — BP 120/80 | HR 90 | Ht 66.0 in | Wt 183.0 lb

## 2015-05-01 DIAGNOSIS — K219 Gastro-esophageal reflux disease without esophagitis: Secondary | ICD-10-CM | POA: Diagnosis not present

## 2015-05-01 DIAGNOSIS — J45909 Unspecified asthma, uncomplicated: Secondary | ICD-10-CM | POA: Diagnosis not present

## 2015-05-01 NOTE — Assessment & Plan Note (Addendum)
Trial     Of    PPI has not worked

## 2015-05-01 NOTE — Assessment & Plan Note (Addendum)
STEP-UP asthma therapy with - Advair 500/50 1 puff twice daily- rinse mouth after use Refill on albuterol MDI Singulair 10 mg at bedtime - take with zyrtec  Call us for flare - we had a detailed discussion about steroids and pregnancy  She will keep a diary of her symptoms and record her peak flow

## 2015-05-01 NOTE — Patient Instructions (Signed)
STEP-UP asthma therapy with - Advair 500/50 1 puff twice daily- rinse mouth after use Refill on albuterol MDI Singulair 10 mg at bedtime - take with zyrtec  Call us for flare  Keep a diary of symptoms

## 2015-05-01 NOTE — Progress Notes (Signed)
   Subjective:    Patient ID: Meagan Chung, female    DOB: 08-28-1985, 30 y.o.   MRN: [id number]  HPI  30 year old never smoker with asthma since childhood Triggers being cats, smoke and cold He has been formally allergy tested in the past and noted to be allergic to trees and pollen she takes Zyrtec daily    [facility] 10/2014 for status asthmaticus  Last seen MW in 10/2014  05/01/2015   Chief Complaint  Patient presents with  . Follow-up    asthma not doing well, having to use inhaler more frequently; after round of prednisone still wheezing.  chest gets very tight.    she feels asthma has been flaring up regularly since her hospitalization in 10/2014   she is currently on Advair 100/50 twice daily-she is required to prednisone tapers in the last 2 months Environment- no change and bed sheets, pillows. older home about 25 years, has carpets and 2 dogs age 14 and 5 She is trying to get pregnant and has concerns about steroids during pregnancy  She has mild postnasal drip and denies obvious heartburn-had a trial of PPI in the past  Review of Systems Patient denies significant dyspnea,cough, hemoptysis,  chest pain, palpitations, pedal edema, orthopnea, paroxysmal nocturnal dyspnea, lightheadedness, nausea, vomiting, abdominal or  leg pains      Objective:   Physical Exam  Gen. Pleasant, well-nourished, in no distress ENT - no lesions, no post nasal drip Neck: No JVD, no thyromegaly, no carotid bruits Lungs: no use of accessory muscles, no dullness to percussion, clear without rales or rhonchi  Cardiovascular: Rhythm regular, heart sounds  normal, no murmurs or gallops, no peripheral edema Musculoskeletal: No deformities, no cyanosis or clubbing        Assessment & Plan:

## 2015-05-20 ENCOUNTER — Other Ambulatory Visit: Payer: Self-pay | Admitting: Family Medicine

## 2015-06-19 ENCOUNTER — Ambulatory Visit: Payer: Managed Care, Other (non HMO) | Admitting: Adult Health

## 2015-07-01 ENCOUNTER — Ambulatory Visit: Payer: Managed Care, Other (non HMO) | Admitting: Family Medicine

## 2015-07-03 LAB — OB RESULTS CONSOLE GC/CHLAMYDIA
Chlamydia: NEGATIVE
Gonorrhea: NEGATIVE

## 2015-07-03 LAB — OB RESULTS CONSOLE ABO/RH: RH TYPE: POSITIVE

## 2015-07-03 LAB — OB RESULTS CONSOLE ANTIBODY SCREEN: Antibody Screen: NEGATIVE

## 2015-07-03 LAB — OB RESULTS CONSOLE RUBELLA ANTIBODY, IGM: Rubella: IMMUNE

## 2015-07-03 LAB — OB RESULTS CONSOLE HIV ANTIBODY (ROUTINE TESTING): HIV: NONREACTIVE

## 2015-07-03 LAB — OB RESULTS CONSOLE HEPATITIS B SURFACE ANTIGEN: HEP B S AG: NEGATIVE

## 2015-07-03 LAB — OB RESULTS CONSOLE RPR: RPR: NONREACTIVE

## 2015-08-07 ENCOUNTER — Ambulatory Visit (HOSPITAL_COMMUNITY)
Admission: RE | Admit: 2015-08-07 | Discharge: 2015-08-07 | Disposition: A | Discharge status: Home / Self Care | Payer: Managed Care, Other (non HMO) | Source: Ambulatory Visit | Attending: Obstetrics & Gynecology | Admitting: Obstetrics & Gynecology

## 2015-08-07 DIAGNOSIS — Z3A13 13 weeks gestation of pregnancy: Secondary | ICD-10-CM | POA: Insufficient documentation

## 2015-08-07 DIAGNOSIS — O280 Abnormal hematological finding on antenatal screening of mother: Secondary | ICD-10-CM

## 2015-08-07 DIAGNOSIS — O289 Unspecified abnormal findings on antenatal screening of mother: Secondary | ICD-10-CM

## 2015-08-07 DIAGNOSIS — O283 Abnormal ultrasonic finding on antenatal screening of mother: Secondary | ICD-10-CM | POA: Diagnosis not present

## 2015-08-07 DIAGNOSIS — Z315 Encounter for genetic counseling: Secondary | ICD-10-CM | POA: Diagnosis not present

## 2015-08-07 DIAGNOSIS — O09899 Supervision of other high risk pregnancies, unspecified trimester: Secondary | ICD-10-CM

## 2015-08-08 ENCOUNTER — Encounter (HOSPITAL_COMMUNITY): Payer: Self-pay

## 2015-08-08 DIAGNOSIS — O289 Unspecified abnormal findings on antenatal screening of mother: Secondary | ICD-10-CM | POA: Insufficient documentation

## 2015-08-08 DIAGNOSIS — O09899 Supervision of other high risk pregnancies, unspecified trimester: Secondary | ICD-10-CM | POA: Insufficient documentation

## 2015-08-08 DIAGNOSIS — O280 Abnormal hematological finding on antenatal screening of mother: Secondary | ICD-10-CM | POA: Insufficient documentation

## 2015-08-08 NOTE — Progress Notes (Signed)
Genetic Counseling  High-Risk Gestation Note  Appointment Date:  08/08/2015 Referred By: Linda Hedges, MD Date of Birth:  09-02-1985 Partner:  Meagan Chung   Pregnancy History: G1P0 Estimated Date of Delivery: 02/09/16 Estimated Gestational Age: [redacted]w[redacted]d Attending: Renella Cunas, MD  Mrs. Meagan Chung and her husband, Meagan Chung, were seen for genetic counseling because of an increased risk for fetal Down syndrome based on first trimester screening through [facility].  In summary:  Reviewed results of screening test  Increased risk for Down syndrome (1 in 123)  Very low PAPP-A on first screen (0.2 MoM); Increased risk for adverse pregnancy outcomes including preeclampsia and fetal growth restriction  Offer third trimester growth scan   Discussed additional screening options  NIPS-elected to pursue today; Patient desires diagnostic testing (amniocentesis but not CVS) and would like results from NIPS in the interim  Ultrasound-scheduled for 08/21/15; offer detailed anatomy ultrasound at approximately 18-[redacted] weeks gestation  Discussed diagnostic testing options  CVS- patient declined  Amniocentesis- elected to schedule for 08/21/15; Will use results of NIPS to help in their consideration of pursuing amniocentesis  Reviewed family history concerns  Discussed general population carrier screening options  CF-previously performed, screen negative  SMA-declined today   They were counseled regarding the screening result and the associated 1 in 123 risk for fetal Down syndrome.  We reviewed chromosomes, nondisjunction, and the common features and variable prognosis of Down syndrome.  In addition, we reviewed the screen adjusted reduction in risks for trisomy 18/13 (1 in 983 to 1 in >10,000).  We also discussed other explanations for a screen positive result including: differences in maternal metabolism, and normal variation.  We specifically discussed that the level of one  of the proteins analyzed on the screen, PAPP-A, was very low (0.21 MoM).  This has been associated with an increased risk for growth restriction or poor pregnancy outcome later in pregnancy; therefore, we would recommend a follow up ultrasound for fetal growth in the third trimester. Additionally, the level of another protein analyzed, AFP, was very elevated (5.83 MoM). We discussed that AFP in the first trimester is not used to assess for open neural tube defects. The significance of a very elevated AFP in the first trimester is not yet well understood, given that this analyte was added within the last two years to first trimester screening algorithms.   We reviewed other available screening options including noninvasive prenatal screening (NIPS)/cell free DNA (cfDNA) screening and detailed ultrasound.  They were counseled that screening tests are used to modify a patient's a priori risk for aneuploidy, typically based on age. This estimate provides a pregnancy specific risk assessment. We reviewed the benefits and limitations of each option. Specifically, we discussed the conditions for which each test screens, the detection rates, and false positive rates of each. They were also counseled regarding diagnostic testing via chorionic villus sampling (CVS) and amniocentesis. We reviewed the approximate 1 in 123XX123 risk for complications from CVS and the approximate 1 in 99991111 risk for complications from amniocentesis, including spontaneous pregnancy loss. We discussed the possible results that the tests might provide including: positive, negative, unanticipated, and no result. Finally, they were counseled regarding the cost of each option and potential out of pocket expenses.   Mrs. Meagan Chung stated that she desires diagnostic testing but would prefer amniocentesis over CVS. She declined CVS at this time and elected to schedule amniocentesis for 08/21/15. After consideration of all the options, they elected to proceed  with NIPS ([facility]ama  through [place]) today in order to have additional information prior to the availability of amniocentesis.  Those results will be available in 8-10 days.  She stated that results from NIPS will help assess whether or not they plan to pursue amniocentesis.   Detailed ultrasound is available at approximately 18-[redacted] weeks gestation.  They understand that screening tests cannot rule out all birth defects or genetic syndromes. The patient was advised of this limitation and states she still does not want additional testing at this time.   Ms./Mrs. * was provided with written information regarding cystic fibrosis (CF) including the carrier frequency and incidence in the Caucasian population, the availability of carrier testing and prenatal diagnosis if indicated.  In addition, we discussed that CF is routinely screened for as part of the [place] newborn screening panel.  She previously had CF carrier screening, which was within normal range. Thus, her risk to be a CF carrier has been dramatically reduced. We also discussed the availability of carrier screening for spinal muscular atrophy (SMA) including the carrier frequency. They declined SMA carrier screening at this time.    Both family histories were reviewed and found to be contributory for autism in the patient's paternal half-brother's son. No etiology is known at this time. We discussed that autism is part of the spectrum of conditions referred to as Autistic spectrum disorders (ASD). We discussed that ASDs are among the most common neurodevelopmental disorders, with approximately 1 in 68 children meeting criteria for ASD, according to the Centers for Disease Control. Approximately 80% of individuals diagnosed are female. There is strong evidence that genetic factors play a critical role in development of ASD. There have been recent advances in identifying specific genetic causes of ASD, however, there are still many individuals for whom the  etiology of the ASD is not known. Once a family has a child with a diagnosis of ASD, there is a 13.5% chance to have another child with ASD. If the pregnancy is female the chance is approximately 9%, and approximately 26% if the pregnancy is female. Data regarding recurrence risk estimates for extended degree relatives are limited at this time. They understand that at this time there is not genetic testing available for ASD for most families.   The patient also reported her maternal grandparents' first child, a daughter, died at 43 days of age and had multiple birth defects. No underlying cause was known. However, the patient reported that her grandmother likely smoked cigarettes and drank alcohol during all of her pregnancies. The patient's grandparents had three additional children (including the patient's mother) who are healthy. We discussed that there are multiple causes for birth defects including sporadic, genetic, environmental, and multifactorial. Recurrence risk for the current pregnancy is likely low given the reported family history; however, additional information regarding an underlying etiology is needed to accurately assess recurrence risk. Without further information regarding the provided family history, an accurate genetic risk cannot be calculated. Further genetic counseling is warranted if more information is obtained.  Mrs. INIYA ANDREASON denied exposure to environmental toxins or chemical agents. She denied the use of alcohol, tobacco or street drugs. She denied significant viral illnesses during the course of her pregnancy. Her medical and surgical histories were contributory for anxiety and depression, for which she is currently treated with prozac. She also reported a history of asthma, which is currently treated with Advair and albuterol (prn). Prozac use during pregnancy does not appear to increase the risk for birth defects.  Using Prozac  late in pregnancy can increase the chance for a  mild transient neonatal syndrome of central nervous system including irritability, vomiting, jitteriness and convulsions, as well as neonatal pulmonary hypertension.  These associated symptoms typically do not appear to occur frequently or severely.     I counseled this couple for approximately 45 minutes regarding the above risks and available options.   Chipper Oman, MS,  Certified Genetic Counselor 08/08/2015

## 2015-08-13 ENCOUNTER — Other Ambulatory Visit (HOSPITAL_COMMUNITY): Payer: Self-pay

## 2015-08-15 ENCOUNTER — Other Ambulatory Visit (HOSPITAL_COMMUNITY): Payer: Self-pay

## 2015-08-19 ENCOUNTER — Telehealth (HOSPITAL_COMMUNITY): Payer: Self-pay | Admitting: MS"

## 2015-08-19 ENCOUNTER — Other Ambulatory Visit (HOSPITAL_COMMUNITY): Payer: Self-pay

## 2015-08-19 NOTE — Telephone Encounter (Signed)
Called Sasheen "Meagan Chung" Mandrell to discuss her prenatal cell free DNA test results.  Mrs. [name] had Panorama testing through [facility].  Testing was offered because of increased Down syndrome risk from first screen.   The patient was identified by name and DOB.  We reviewed that these are within normal limits, showing a less than 1 in 10,000 risk for trisomies 21, 18 and 13, and monosomy X (Turner syndrome).  In addition, the risk for triploidy/vanishing twin and sex chromosome trisomies (47,XXX and 47,XXY) was also low risk.  We reviewed that this testing identifies > 99% of pregnancies with trisomy 80, trisomy 59, sex chromosome trisomies (47,XXX and 47,XXY), and triploidy. The detection rate for trisomy 18 is 96%.  The detection rate for monosomy X is ~92%.  The false positive rate is <0.1% for all conditions. Testing was also consistent with female fetal sex.  The patient did wish to know fetal sex.    Mrs. [name] stated that she likely will cancel her amniocentesis that is scheduled this week, given that she feels comfortable with these results. However, she has an OB visit this afternoon and would like to discuss further with her OB. She understands that this testing does not identify all genetic conditions.  All questions were answered to her satisfaction, she was encouraged to call with additional questions or concerns.  Chipper Oman, MS Certified Genetic Counselor 08/19/2015 8:52 AM

## 2015-08-21 ENCOUNTER — Ambulatory Visit (HOSPITAL_COMMUNITY): Payer: Managed Care, Other (non HMO)

## 2015-09-08 ENCOUNTER — Emergency Department (HOSPITAL_BASED_OUTPATIENT_CLINIC_OR_DEPARTMENT_OTHER)
Admission: EM | Admit: 2015-09-08 | Discharge: 2015-09-08 | Disposition: A | Discharge status: Home / Self Care | Payer: Managed Care, Other (non HMO) | Attending: Emergency Medicine | Admitting: Emergency Medicine

## 2015-09-08 ENCOUNTER — Encounter (HOSPITAL_BASED_OUTPATIENT_CLINIC_OR_DEPARTMENT_OTHER): Payer: Self-pay | Admitting: Emergency Medicine

## 2015-09-08 DIAGNOSIS — E782 Mixed hyperlipidemia: Secondary | ICD-10-CM | POA: Insufficient documentation

## 2015-09-08 DIAGNOSIS — J45909 Unspecified asthma, uncomplicated: Secondary | ICD-10-CM | POA: Insufficient documentation

## 2015-09-08 DIAGNOSIS — M25511 Pain in right shoulder: Secondary | ICD-10-CM | POA: Diagnosis present

## 2015-09-08 DIAGNOSIS — F329 Major depressive disorder, single episode, unspecified: Secondary | ICD-10-CM | POA: Diagnosis not present

## 2015-09-08 DIAGNOSIS — M62838 Other muscle spasm: Secondary | ICD-10-CM | POA: Diagnosis not present

## 2015-09-08 NOTE — Discharge Instructions (Signed)
Heat Therapy  Heat therapy can help ease sore, stiff, injured, and tight muscles and joints. Heat relaxes your muscles, which may help ease your pain.   RISKS AND COMPLICATIONS  If you have any of the following conditions, do not use heat therapy unless your health care provider has approved:   Poor circulation.   Healing wounds or scarred skin in the area being treated.   Diabetes, heart disease, or high blood pressure.   Not being able to feel (numbness) the area being treated.   Unusual swelling of the area being treated.   Active infections.   Blood clots.   Cancer.   Inability to communicate pain. This may include young children and people who have problems with their brain function (dementia).   Pregnancy.  Heat therapy should only be used on old, pre-existing, or long-lasting (chronic) injuries. Do not use heat therapy on new injuries unless directed by your health care provider.  HOW TO USE HEAT THERAPY  There are several different kinds of heat therapy, including:   Moist heat pack.   Warm water bath.   Hot water bottle.   Electric heating pad.   Heated gel pack.   Heated wrap.   Electric heating pad.  Use the heat therapy method suggested by your health care provider. Follow your health care provider's instructions on when and how to use heat therapy.  GENERAL HEAT THERAPY RECOMMENDATIONS   Do not sleep while using heat therapy. Only use heat therapy while you are awake.   Your skin may turn pink while using heat therapy. Do not use heat therapy if your skin turns red.   Do not use heat therapy if you have new pain.   High heat or long exposure to heat can cause burns. Be careful when using heat therapy to avoid burning your skin.   Do not use heat therapy on areas of your skin that are already irritated, such as with a rash or sunburn.  SEEK MEDICAL CARE IF:   You have blisters, redness, swelling, or numbness.   You have new pain.   Your pain is worse.  MAKE SURE  YOU:   Understand these instructions.   Will watch your condition.   Will get help right away if you are not doing well or get worse.     This information is not intended to replace advice given to you by your health care provider. Make sure you discuss any questions you have with your health care provider.     Document Released: 05/10/2011 Document Revised: 03/08/2014 Document Reviewed: 04/10/2013  Elsevier Interactive Patient Education 2016 [employer]

## 2015-09-08 NOTE — ED Notes (Signed)
Pt in c/o R neck, back and shoulder pain onset today. [redacted] weeks pregnant. Ambulatory in NAD.

## 2015-09-08 NOTE — ED Notes (Signed)
EDP into room, at BS.  ?

## 2015-09-08 NOTE — ED Notes (Signed)
Care assumed at time of d/c, orders received to give hot pack and d/c.

## 2015-09-08 NOTE — ED Provider Notes (Signed)
CSN: [id number]     Arrival date & time 09/08/15  0044 History  By signing my name below, I, Meagan Chung, attest that this documentation has been prepared under the direction and in the presence of Keita Demarco, MD. Electronically Signed: Hansel Chung, ED Scribe. 09/08/2015. 12:59 AM.     Chief Complaint  Patient presents with  . Shoulder Pain   Patient is a 30 y.o. female presenting with shoulder pain. The history is provided by the patient. No language interpreter was used.  Shoulder Pain Location:  Shoulder Time since incident:  2 weeks Injury: no   Shoulder location:  R shoulder Pain details:    Quality:  Dull and throbbing   Radiates to:  R upper arm (neck)   Severity:  Moderate   Onset quality:  Gradual   Duration:  2 weeks   Timing:  Constant   Progression:  Worsening Chronicity:  New Prior injury to area:  No Relieved by:  Ice and heat (massage) Ineffective treatments:  Rest Associated symptoms: neck pain   Associated symptoms: no muscle weakness, no numbness and no swelling   Risk factors: no frequent fractures    HPI Comments: Meagan Chung is a 30 y.o. female [redacted]w[redacted]d who presents to the Emergency Department complaining of moderate, dull right shoulder pain onset 2 weeks ago and worsened last night to be throbbing. Per pt, her pain radiates to her right neck and right arm. She denies recent falls, trauma or injury, but notes that she is sleeping on the right side. Pt states she has tried ice, heat, massage and tylenol with temporary relief of pain. No worsening factors noted. She denies weakness, numbness.   Past Medical History  Diagnosis Date  . Asthma   . Depression     anxiety, bulemia in past all well controlled  . Thyroid nodule   . Diarrhea 11/29/2014  . Hyperlipidemia, mixed 02/02/2015  . Anxiety and depression     anxiety, bulemia in past all well controlled    Past Surgical History  Procedure Laterality Date  . Ankle surgery    . Wisdom tooth extraction  Bilateral    Family History  Problem Relation Age of Onset  . Stroke Father     2  . Hyperlipidemia Father   . Anorexia nervosa Sister     17  . Kidney Stones Sister   . Thyroid disease Brother   . Obesity Brother   . Dementia Maternal Grandmother   . Osteoporosis Maternal Grandmother   . Cancer Maternal Grandmother     breast  . Cancer Maternal Grandfather     prostate  . Alcohol abuse Maternal Grandfather   . Cancer Paternal Grandmother 46    3 masses in pancreas and thyroid, suspect cancer  . Hyperlipidemia Paternal Grandmother    Social History  Substance Use Topics  . Smoking status: Never Smoker   . Smokeless tobacco: None  . Alcohol Use: 0.0 oz/week    0 Standard drinks or equivalent per week     Comment: rarely   OB History    Gravida Para Term Preterm AB TAB SAB Ectopic Multiple Living   1              Review of Systems  Musculoskeletal: Positive for myalgias and neck pain.  Neurological: Negative for weakness and numbness.  All other systems reviewed and are negative.  Allergies  Other  Home Medications   Prior to Admission medications   Medication Sig Start  Date End Date Taking? Authorizing Provider  albuterol (PROVENTIL HFA;VENTOLIN HFA) 108 (90 BASE) MCG/ACT inhaler Inhale 1-2 puffs into the lungs every 6 (six) hours as needed for wheezing or shortness of breath.    Historical Provider, MD  cetirizine (ZYRTEC) 10 MG tablet Take 10 mg by mouth at bedtime. Reported on 05/01/2015    Historical Provider, MD  FLUoxetine (PROZAC) 20 MG capsule Take 20 mg by mouth daily.    Historical Provider, MD  Fluticasone-Salmeterol (ADVAIR) 100-50 MCG/DOSE AEPB Inhale 1 puff into the lungs 2 (two) times daily. 03/21/15   Percell Miller Saguier, PA-C  folic acid (FOLVITE) A999333 MCG tablet Take 800 mcg by mouth 4 (four) times daily.    Historical Provider, MD  ipratropium-albuterol (DUONEB) 0.5-2.5 (3) MG/3ML SOLN Take 3 mLs by nebulization every 4 (four) hours as needed.     Historical Provider, MD  LamoTRIgine (LAMICTAL PO) Take 50 mg by mouth 2 (two) times daily.    Historical Provider, MD  Multiple Vitamins-Minerals (MULTIVITAMIN PO) Take by mouth.    Historical Provider, MD  valACYclovir (VALTREX) 500 MG tablet TAKE 1 TABLET BY MOUTH TWICE DAILY FOR 3 DAYS THEN TAKE 1 TABLET BY MOUTH DAILY 05/20/15   Mosie Lukes, MD   BP 130/84 mmHg  Pulse 94  Temp(Src) 97.9 F (36.6 C) (Oral)  Resp 18  SpO2 95%  LMP 05/05/2015 Physical Exam  Constitutional: She is oriented to person, place, and time. She appears well-developed and well-nourished.  HENT:  Head: Normocephalic and atraumatic.  Mouth/Throat: Oropharynx is clear and moist. No oropharyngeal exudate.  Eyes: Conjunctivae and EOM are normal. Pupils are equal, round, and reactive to light.  Neck: Normal range of motion. Neck supple. No JVD present. No tracheal deviation present.  No carotid bruits. Trachea midline.   Cardiovascular: Normal rate, regular rhythm and normal heart sounds.  Exam reveals no gallop and no friction rub.   No murmur heard. RRR.   Pulmonary/Chest: Effort normal and breath sounds normal. No stridor. No respiratory distress. She has no wheezes. She has no rales.  Lungs CTA bilaterally.   Abdominal: Soft. Bowel sounds are normal. She exhibits no distension. There is no rebound and no guarding.  Musculoskeletal: Normal range of motion.       Right shoulder: She exhibits normal range of motion, no tenderness, no bony tenderness, no swelling, no effusion, no crepitus, no deformity, no laceration, no pain, normal pulse and normal strength.       Right elbow: Normal.      Cervical back: Normal.       Thoracic back: Normal.       Lumbar back: Normal.       Right upper arm: Normal.  Muscle spasm right trapezius. No cervical, thoracic or lumbar spinal tenderness. No step-offs, crepitus or deformity. FROM of the C T and L spine. Negative Neer's test. Intact biceps and triceps.    Lymphadenopathy:    She has no cervical adenopathy.  Neurological: She is alert and oriented to person, place, and time. She has normal reflexes.  Skin: Skin is warm and dry.  Psychiatric: She has a normal mood and affect.  Nursing note and vitals reviewed.   ED Course  Procedures (including critical care time) DIAGNOSTIC STUDIES: Oxygen Saturation is 95% on RA, normal by my interpretation.    COORDINATION OF CARE: 12:58 AM Discussed treatment plan with pt at bedside which includes orthopedic and OB f/u and pt agreed to plan.    MDM  Final diagnoses:  None    Filed Vitals:   09/08/15 0052  BP: 130/84  Pulse: 94  Temp: 97.9 F (36.6 C)  Resp: 18    Continue Tylenol, heat therapy.  Contact your ON for options that are on the safe med list.  Referral to Dr. Barbaraann Barthel.  Strict return precautions  I personally performed the services described in this documentation, which was scribed in my presence. The recorded information has been reviewed and is accurate.     Veatrice Kells, MD 09/08/15 [phone]

## 2015-09-08 NOTE — ED Notes (Addendum)
Pt ~ [redacted] weeks pregnant, has OB GYN, c/o only R shoulder/neck spasm, and some intermittant nausea. Denies questions or needs. Given hot pack and referral.

## 2016-01-15 LAB — OB RESULTS CONSOLE GBS: STREP GROUP B AG: POSITIVE

## 2016-01-30 ENCOUNTER — Inpatient Hospital Stay (HOSPITAL_COMMUNITY)
Admission: AD | Admit: 2016-01-30 | Discharge: 2016-02-02 | DRG: 774 | Disposition: A | Discharge status: Home / Self Care | Payer: Managed Care, Other (non HMO) | Source: Ambulatory Visit | Attending: Obstetrics and Gynecology | Admitting: Obstetrics and Gynecology

## 2016-01-30 ENCOUNTER — Encounter (HOSPITAL_COMMUNITY): Payer: Self-pay | Admitting: *Deleted

## 2016-01-30 DIAGNOSIS — O99824 Streptococcus B carrier state complicating childbirth: Secondary | ICD-10-CM | POA: Diagnosis present

## 2016-01-30 DIAGNOSIS — A6000 Herpesviral infection of urogenital system, unspecified: Secondary | ICD-10-CM | POA: Diagnosis present

## 2016-01-30 DIAGNOSIS — O894 Spinal and epidural anesthesia-induced headache during the puerperium: Secondary | ICD-10-CM | POA: Diagnosis not present

## 2016-01-30 DIAGNOSIS — Z3A38 38 weeks gestation of pregnancy: Secondary | ICD-10-CM

## 2016-01-30 DIAGNOSIS — J45909 Unspecified asthma, uncomplicated: Secondary | ICD-10-CM | POA: Diagnosis present

## 2016-01-30 DIAGNOSIS — O280 Abnormal hematological finding on antenatal screening of mother: Secondary | ICD-10-CM

## 2016-01-30 DIAGNOSIS — O411230 Chorioamnionitis, third trimester, not applicable or unspecified: Secondary | ICD-10-CM | POA: Diagnosis present

## 2016-01-30 DIAGNOSIS — O9952 Diseases of the respiratory system complicating childbirth: Secondary | ICD-10-CM | POA: Diagnosis present

## 2016-01-30 DIAGNOSIS — O99214 Obesity complicating childbirth: Secondary | ICD-10-CM | POA: Diagnosis present

## 2016-01-30 DIAGNOSIS — O9962 Diseases of the digestive system complicating childbirth: Secondary | ICD-10-CM | POA: Diagnosis present

## 2016-01-30 DIAGNOSIS — O4292 Full-term premature rupture of membranes, unspecified as to length of time between rupture and onset of labor: Secondary | ICD-10-CM | POA: Diagnosis present

## 2016-01-30 DIAGNOSIS — Z6837 Body mass index (BMI) 37.0-37.9, adult: Secondary | ICD-10-CM | POA: Diagnosis not present

## 2016-01-30 DIAGNOSIS — K219 Gastro-esophageal reflux disease without esophagitis: Secondary | ICD-10-CM | POA: Diagnosis present

## 2016-01-30 DIAGNOSIS — Z823 Family history of stroke: Secondary | ICD-10-CM | POA: Diagnosis not present

## 2016-01-30 DIAGNOSIS — O9832 Other infections with a predominantly sexual mode of transmission complicating childbirth: Secondary | ICD-10-CM | POA: Diagnosis present

## 2016-01-30 DIAGNOSIS — O09899 Supervision of other high risk pregnancies, unspecified trimester: Secondary | ICD-10-CM

## 2016-01-30 LAB — URINALYSIS, ROUTINE W REFLEX MICROSCOPIC
BILIRUBIN URINE: NEGATIVE
GLUCOSE, UA: 100 mg/dL — AB
Ketones, ur: NEGATIVE mg/dL
Leukocytes, UA: NEGATIVE
Nitrite: NEGATIVE
Protein, ur: NEGATIVE mg/dL
SPECIFIC GRAVITY, URINE: 1.015 (ref 1.005–1.030)
pH: 6.5 (ref 5.0–8.0)

## 2016-01-30 LAB — CBC
HEMATOCRIT: 37.9 % (ref 36.0–46.0)
Hemoglobin: 12.7 g/dL (ref 12.0–15.0)
MCH: 29 pg (ref 26.0–34.0)
MCHC: 33.5 g/dL (ref 30.0–36.0)
MCV: 86.5 fL (ref 78.0–100.0)
PLATELETS: 247 10*3/uL (ref 150–400)
RBC: 4.38 MIL/uL (ref 3.87–5.11)
RDW: 14.1 % (ref 11.5–15.5)
WBC: 15.4 10*3/uL — AB (ref 4.0–10.5)

## 2016-01-30 LAB — URINE MICROSCOPIC-ADD ON

## 2016-01-30 LAB — TYPE AND SCREEN
ABO/RH(D): A POS
ANTIBODY SCREEN: NEGATIVE

## 2016-01-30 MED ORDER — PENICILLIN G POT IN DEXTROSE 60000 UNIT/ML IV SOLN
3.0000 10*6.[IU] | INTRAVENOUS | Status: DC
  Administered 2016-01-31 (×4): 3 10*6.[IU] via INTRAVENOUS
  Filled 2016-01-30 (×6): qty 50

## 2016-01-30 MED ORDER — FLEET ENEMA 7-19 GM/118ML RE ENEM: 1.0000 | ENEMA | RECTAL | Status: DC | PRN

## 2016-01-30 MED ORDER — MISOPROSTOL 25 MCG QUARTER TABLET
25.0000 ug | ORAL_TABLET | ORAL | Status: DC
  Filled 2016-01-30 (×5): qty 1

## 2016-01-30 MED ORDER — SOD CITRATE-CITRIC ACID 500-334 MG/5ML PO SOLN: 30.0000 mL | ORAL | Status: DC | PRN

## 2016-01-30 MED ORDER — ONDANSETRON HCL 4 MG/2ML IJ SOLN: 4.0000 mg | Freq: Four times a day (QID) | INTRAMUSCULAR | Status: DC | PRN

## 2016-01-30 MED ORDER — DEXTROSE 5 % IV SOLN
5.0000 10*6.[IU] | Freq: Once | INTRAVENOUS | Status: AC
  Administered 2016-01-30: 5 10*6.[IU] via INTRAVENOUS
  Filled 2016-01-30: qty 5

## 2016-01-30 MED ORDER — OXYCODONE-ACETAMINOPHEN 5-325 MG PO TABS: 2.0000 | ORAL_TABLET | ORAL | Status: DC | PRN

## 2016-01-30 MED ORDER — LIDOCAINE HCL (PF) 1 % IJ SOLN
30.0000 mL | INTRAMUSCULAR | Status: DC | PRN
Start: 2016-01-30 — End: 2016-02-01
  Filled 2016-01-30: qty 30

## 2016-01-30 MED ORDER — MISOPROSTOL 200 MCG PO TABS
50.0000 ug | ORAL_TABLET | ORAL | Status: DC
  Administered 2016-01-30 – 2016-01-31 (×2): 50 ug via ORAL
  Filled 2016-01-30: qty 0.5

## 2016-01-30 MED ORDER — OXYCODONE-ACETAMINOPHEN 5-325 MG PO TABS: 1.0000 | ORAL_TABLET | ORAL | Status: DC | PRN

## 2016-01-30 MED ORDER — OXYTOCIN BOLUS FROM INFUSION
500.0000 mL | Freq: Once | INTRAVENOUS | Status: AC
  Administered 2016-01-31: 500 mL via INTRAVENOUS

## 2016-01-30 MED ORDER — LACTATED RINGERS IV SOLN: 500.0000 mL | INTRAVENOUS | Status: DC | PRN

## 2016-01-30 MED ORDER — OXYTOCIN 40 UNITS IN LACTATED RINGERS INFUSION - SIMPLE MED
2.5000 [IU]/h | INTRAVENOUS | Status: DC
  Filled 2016-01-30: qty 1000

## 2016-01-30 MED ORDER — ACETAMINOPHEN 325 MG PO TABS
650.0000 mg | ORAL_TABLET | ORAL | Status: DC | PRN
  Administered 2016-01-31: 650 mg via ORAL
  Filled 2016-01-30 (×2): qty 2

## 2016-01-30 MED ORDER — LACTATED RINGERS IV SOLN
INTRAVENOUS | Status: DC
  Administered 2016-01-30 – 2016-01-31 (×2): via INTRAVENOUS

## 2016-01-30 MED ORDER — FENTANYL CITRATE (PF) 100 MCG/2ML IJ SOLN
100.0000 ug | INTRAMUSCULAR | Status: DC | PRN
  Administered 2016-01-31: 100 ug via INTRAVENOUS
  Filled 2016-01-30: qty 2

## 2016-01-30 NOTE — MAU Note (Signed)
About 2 hours ago had gush of fld and has happened few more times. Fld slight pink tinge. Few mild contractions. Cervix closed on THurs.

## 2016-01-31 ENCOUNTER — Inpatient Hospital Stay (HOSPITAL_COMMUNITY): Payer: Managed Care, Other (non HMO) | Admitting: Anesthesiology

## 2016-01-31 LAB — RPR: RPR Ser Ql: NONREACTIVE

## 2016-01-31 LAB — ABO/RH: ABO/RH(D): A POS

## 2016-01-31 MED ORDER — HYDROMORPHONE HCL 1 MG/ML IJ SOLN: 1.0000 mg | Freq: Once | INTRAMUSCULAR | Status: DC

## 2016-01-31 MED ORDER — MISOPROSTOL 200 MCG PO TABS: 1000.0000 ug | ORAL_TABLET | Freq: Once | ORAL | Status: DC

## 2016-01-31 MED ORDER — EPHEDRINE 5 MG/ML INJ
10.0000 mg | INTRAVENOUS | Status: DC | PRN
  Filled 2016-01-31: qty 4

## 2016-01-31 MED ORDER — SODIUM CHLORIDE 0.9 % IV SOLN: 3.0000 g | Freq: Four times a day (QID) | INTRAVENOUS | Status: DC

## 2016-01-31 MED ORDER — DIPHENHYDRAMINE HCL 50 MG/ML IJ SOLN: 12.5000 mg | INTRAMUSCULAR | Status: DC | PRN

## 2016-01-31 MED ORDER — PHENYLEPHRINE 40 MCG/ML (10ML) SYRINGE FOR IV PUSH (FOR BLOOD PRESSURE SUPPORT)
80.0000 ug | PREFILLED_SYRINGE | INTRAVENOUS | Status: DC | PRN
  Filled 2016-01-31: qty 10
  Filled 2016-01-31: qty 5

## 2016-01-31 MED ORDER — HYDROMORPHONE HCL 1 MG/ML IJ SOLN
1.0000 mg | Freq: Once | INTRAMUSCULAR | Status: AC
  Administered 2016-01-31: 1 mg via INTRAVENOUS
  Filled 2016-01-31: qty 1

## 2016-01-31 MED ORDER — MISOPROSTOL 200 MCG PO TABS
ORAL_TABLET | ORAL | Status: AC
  Filled 2016-01-31: qty 5

## 2016-01-31 MED ORDER — SODIUM BICARBONATE 8.4 % IV SOLN
INTRAVENOUS | Status: DC | PRN
  Administered 2016-01-31 (×2): 5 mL via EPIDURAL
  Administered 2016-01-31: 6 mL via EPIDURAL

## 2016-01-31 MED ORDER — GENTAMICIN SULFATE 40 MG/ML IJ SOLN
180.0000 mg | Freq: Three times a day (TID) | INTRAVENOUS | Status: DC
  Filled 2016-01-31 (×2): qty 4.5

## 2016-01-31 MED ORDER — GENTAMICIN SULFATE 40 MG/ML IJ SOLN
190.0000 mg | Freq: Once | INTRAVENOUS | Status: AC
  Administered 2016-01-31: 190 mg via INTRAVENOUS
  Filled 2016-01-31: qty 4.75

## 2016-01-31 MED ORDER — HYDROMORPHONE HCL 1 MG/ML IJ SOLN
INTRAMUSCULAR | Status: AC
  Administered 2016-01-31: 1 mg
  Filled 2016-01-31: qty 1

## 2016-01-31 MED ORDER — LACTATED RINGERS IV SOLN: 500.0000 mL | Freq: Once | INTRAVENOUS | Status: DC

## 2016-01-31 MED ORDER — HYDROMORPHONE HCL 1 MG/ML IJ SOLN
INTRAMUSCULAR | Status: AC
  Filled 2016-01-31: qty 1

## 2016-01-31 MED ORDER — PHENYLEPHRINE 40 MCG/ML (10ML) SYRINGE FOR IV PUSH (FOR BLOOD PRESSURE SUPPORT): 80.0000 ug | PREFILLED_SYRINGE | INTRAVENOUS | Status: DC | PRN

## 2016-01-31 MED ORDER — OXYTOCIN 40 UNITS IN LACTATED RINGERS INFUSION - SIMPLE MED
1.0000 m[IU]/min | INTRAVENOUS | Status: DC
  Administered 2016-01-31: 2 m[IU]/min via INTRAVENOUS

## 2016-01-31 MED ORDER — EPHEDRINE 5 MG/ML INJ: 10.0000 mg | INTRAVENOUS | Status: DC | PRN

## 2016-01-31 MED ORDER — TERBUTALINE SULFATE 1 MG/ML IJ SOLN
0.2500 mg | Freq: Once | INTRAMUSCULAR | Status: DC | PRN
  Filled 2016-01-31: qty 1

## 2016-01-31 MED ORDER — DIPHENHYDRAMINE HCL 25 MG PO CAPS
25.0000 mg | ORAL_CAPSULE | Freq: Four times a day (QID) | ORAL | Status: DC | PRN
  Administered 2016-01-31: 25 mg via ORAL

## 2016-01-31 MED ORDER — FENTANYL 2.5 MCG/ML BUPIVACAINE 1/10 % EPIDURAL INFUSION (WH - ANES)
14.0000 mL/h | INTRAMUSCULAR | Status: DC | PRN
  Administered 2016-01-31: 8 mL/h via EPIDURAL
  Administered 2016-01-31: 12 mL/h via EPIDURAL
  Administered 2016-01-31 (×2): 14 mL/h via EPIDURAL
  Filled 2016-01-31 (×3): qty 100

## 2016-01-31 MED ORDER — SODIUM CHLORIDE 0.9 % IV SOLN
2.0000 g | Freq: Four times a day (QID) | INTRAVENOUS | Status: DC
  Administered 2016-01-31: 2 g via INTRAVENOUS
  Filled 2016-01-31 (×4): qty 2000

## 2016-01-31 MED ORDER — PHENYLEPHRINE 40 MCG/ML (10ML) SYRINGE FOR IV PUSH (FOR BLOOD PRESSURE SUPPORT)
80.0000 ug | PREFILLED_SYRINGE | INTRAVENOUS | Status: DC | PRN
  Filled 2016-01-31: qty 5

## 2016-01-31 MED ORDER — LIDOCAINE HCL (PF) 1 % IJ SOLN
INTRAMUSCULAR | Status: DC | PRN
  Administered 2016-01-31: 6 mL via EPIDURAL
  Administered 2016-01-31: 5 mL via EPIDURAL

## 2016-01-31 MED ORDER — HYDROMORPHONE HCL 1 MG/ML IJ SOLN
1.0000 mg | Freq: Once | INTRAMUSCULAR | Status: AC
  Administered 2016-01-31: 1 mg via INTRAVENOUS

## 2016-01-31 NOTE — Anesthesia Pain Management Evaluation Note (Signed)
  [facility] Pain Management Visit Note  Patient: Meagan Chung, 31 y.o., female  "Hello I am a member of the anesthesia team at [facility]. We have an anesthesia team available at all times to provide care throughout the hospital, including epidural management and anesthesia for C-section. I don't know your plan for the delivery whether it a natural birth, water birth, IV sedation, nitrous supplementation, doula or epidural, but we want to meet your pain goals."   1.Was your pain managed to your expectations on prior hospitalizations?   Yes   2.What is your expectation for pain management during this hospitalization?     IV pain meds  3.How can we help you reach that goal? Be available if needed  Record the patient's initial score and the patient's pain goal.   Pain: 4  Pain Goal: 5 [facility] wants you to be able to say your pain was always managed very well.  Edessa Jakubowicz 01/31/2016

## 2016-01-31 NOTE — Anesthesia Procedure Notes (Signed)
Epidural Patient location during procedure: OB Start time: 01/31/2016 12:15 PM End time: 01/31/2016 1:12 PM  Staffing Anesthesiologist: Annye Asa Performed: anesthesiologist   Preanesthetic Checklist Completed: patient identified, surgical consent, pre-op evaluation, timeout performed, IV checked, risks and benefits discussed and monitors and equipment checked  Epidural Patient position: sitting Prep: site prepped and draped and DuraPrep Patient monitoring: blood pressure, continuous pulse ox and heart rate Approach: midline Location: L3-L4 Injection technique: LOR air  Needle:  Needle type: Tuohy  Needle gauge: 17 G Needle length: 9 cm Needle insertion depth: 5.5 cm Catheter type: closed end flexible Catheter size: 19 Gauge Catheter at skin depth: 12 cm Test dose: negative (1% lidocaine)  Assessment Events: blood not aspirated, injection not painful, no injection resistance, negative IV test and no paresthesia  Additional Notes Pt identified in Labor room.  Monitors applied. Working IV access confirmed. Pt has a sore/scab on her back, midline L 4.  Will alter to L 2,3.  Sterile prep, drape lumbar spine.  1% lido local L 2,3.  #17ga Touhy LOR air at 5.5 cm L 2,3, cath in easily to 11.5 cm skin. CSF return from cath!  Cath removed.  Repeat local L 3,4, several attempts and LOR air 5.5 cm, cath in easily to 12 cm skin, Test dose OK, cath dosed and infusion begun.  Patient asymptomatic, VSS, no heme aspirated, tolerated well.  Jenita Seashore, MDReason for block:procedure for pain

## 2016-01-31 NOTE — Anesthesia Preprocedure Evaluation (Signed)
Anesthesia Evaluation  Patient identified by MRN, date of birth, ID band Patient awake    Reviewed: Allergy & Precautions, NPO status , Patient's Chart, lab work & pertinent test results  History of Anesthesia Complications Negative for: history of anesthetic complications  Airway Mallampati: II  TM Distance: >3 FB Neck ROM: Full    Dental  (+) Dental Advisory Given   Pulmonary asthma ,    breath sounds clear to auscultation       Cardiovascular negative cardio ROS   Rhythm:Regular Rate:Normal     Neuro/Psych Anxiety Depression negative neurological ROS     GI/Hepatic Neg liver ROS, GERD  Poorly Controlled,  Endo/Other  Morbid obesity  Renal/GU negative Renal ROS     Musculoskeletal   Abdominal (+) + obese,   Peds  Hematology negative hematology ROS (+)   Anesthesia Other Findings   Reproductive/Obstetrics (+) Pregnancy                             Anesthesia Physical Anesthesia Plan  ASA: II  Anesthesia Plan: Epidural   Post-op Pain Management:    Induction:   Airway Management Planned: Natural Airway  Additional Equipment:   Intra-op Plan:   Post-operative Plan:   Informed Consent: I have reviewed the patients History and Physical, chart, labs and discussed the procedure including the risks, benefits and alternatives for the proposed anesthesia with the patient or authorized representative who has indicated his/her understanding and acceptance.     Plan Discussed with: CRNA and Surgeon  Anesthesia Plan Comments: (Patient identified. Risks/Benefits/Options discussed with patient including but not limited to bleeding, infection, nerve damage, paralysis, failed block, incomplete pain control, headache, blood pressure changes, nausea, vomiting, reactions to medication both or allergic, itching and postpartum back pain. Confirmed with bedside nurse the patient's most recent  platelet count. Confirmed with patient that they are not currently taking any anticoagulation, have any bleeding history or any family history of bleeding disorders. Patient expressed understanding and wished to proceed. All questions were answered. )        Anesthesia Quick Evaluation

## 2016-01-31 NOTE — H&P (Signed)
Meagan Chung is a 30 y.o. female presenting for SROM. Occ ctxns. +FM. No VB. OB History    Gravida Para Term Preterm AB Living   1             SAB TAB Ectopic Multiple Live Births                 Past Medical History:  Diagnosis Date  . Anxiety and depression    anxiety, bulemia in past all well controlled   . Asthma   . Depression    anxiety, bulemia in past all well controlled  . Diarrhea 11/29/2014  . Hyperlipidemia, mixed 02/02/2015  . Thyroid nodule    Past Surgical History:  Procedure Laterality Date  . ANKLE SURGERY    . WISDOM TOOTH EXTRACTION Bilateral    Family History: family history includes Alcohol abuse in her maternal grandfather; Anorexia nervosa in her sister; Cancer in her maternal grandfather and maternal grandmother; Cancer (age of onset: 62) in her paternal grandmother; Dementia in her maternal grandmother; Hyperlipidemia in her father and paternal grandmother; Kidney Stones in her sister; Obesity in her brother; Osteoporosis in her maternal grandmother; Stroke in her father; Thyroid disease in her brother. Social History:  reports that she has never smoked. She has never used smokeless tobacco. She reports that she drinks alcohol. She reports that she does not use drugs.     Maternal Diabetes: No Genetic Screening: Abnormal:  Results: Trisomy 21 but panorama negative.  Maternal Ultrasounds/Referrals: Normal Fetal Ultrasounds or other Referrals:  None Maternal Substance Abuse:  No Significant Maternal Medications:  Meds include: Other:  valtrex Significant Maternal Lab Results:  None Other Comments:  None  ROS neg except above  History  SVE closed/long/high, vertex Blood pressure (!) 144/70, pulse 87, temperature 98 F (36.7 C), temperature source Oral, resp. rate 18, height 5\' 6"  (1.676 m), weight 106.1 kg (234 lb), last menstrual period 05/05/2015. Exam Physical Exam  Gen NAD A&O Pulm NWOB CV reg rate Abd soft, nontender, gravid No CVA  tend  Prenatal labs: ABO, Rh: --/--/A POS, A POS (12/01 2200) Antibody: NEG (12/01 2200) Rubella: Immune (05/04 0000) RPR: Nonreactive (05/04 0000)  HBsAg: Negative (05/04 0000)  HIV: Non-reactive (05/04 0000)  GBS: Positive (11/16 0000)   Assessment/Plan: 30 yo here w/SROM @ 38.5 wga. Cervix unfavorable on admission (closed). Plan for cytotec/crib followed by pitocin. H/o anxiety, no meds currently. H/o asthma well controlled. 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 Anticipate NSVD.  Tyson Dense 01/31/2016, 12:18 PM

## 2016-01-31 NOTE — Progress Notes (Signed)
Late entry. Pt seen @ ~0730. S:  No complaints.   O:  Vitals  Vitals:   01/31/16 0432 01/31/16 1001  BP: 129/80 (!) 144/70  Pulse: 81 87  Resp: 18   Temp: 98 F (36.7 C)     Gen: NAD SVE: 1/long/high FHT: cat 1 Toco: irritable  A/P: 30 yo here w/SROM @ 38.5 wga.  # SROM:  S/p cytotec x 2, w/minimal change. FB placed.  # MWB: H/o anxiety, no meds currently. H/o asthma well controlled. 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   # FWB: Cat 1 tracing EFW 7#  # GBS pos: PCN per protocol  Lucillie Garfinkel MD

## 2016-01-31 NOTE — Progress Notes (Signed)
Temp to 101F and tachy to low 100s. Presumed chorioamnionitis. Initiate Amp/Gent. SVE 5.5/100/0, making cervical change and approaching active labor. Cat 1 tracing. Ctm closely. Arty Baumgartner MD

## 2016-01-31 NOTE — Progress Notes (Signed)
Pharmacy Antibiotic Note  Meagan Chung is a 30 y.o. female admitted on 01/30/2016 at 7 4/[redacted]weeks gestation. She is now  laboring & has increased temp=101,with presumed chorioamnionitis.  Pharmacy has been consulted for Gentamicin dosing.  Plan: 1) Gentamicin 190mg  IV load, then 2) Gentamicin 180mg  IV Q8 to start 8 hrs after load.  Height: 5\' 6"  (167.6 cm) Weight: 234 lb (106.1 kg) IBW/kg (Calculated) : 59.3  Temp (24hrs), Avg:99.6 F (37.6 C), Min:97.7 F (36.5 C), Max:101.5 F (38.6 C)   Recent Labs Lab 01/30/16 2200  WBC 15.4*    CrCl cannot be calculated (Patient's most recent lab result is older than the maximum 21 days allowed.).  Est Scr= 0.79  Allergies  Allergen Reactions  . Other Hives    Capers causes SOB Cats 4+    Antimicrobials this admission: Penicillin 57mu, 59mu IV 01/30/16 >>01/31/16 Ampicillin 2gm IV >> 01/31/16   Microbiology results: Results for orders placed or performed during the hospital encounter of 01/30/16  OB RESULT CONSOLE Group B Strep     Status: None   Collection Time: 01/15/16 12:00 AM  Result Value Ref Range Status   GBS Positive  Final    Thank you for allowing pharmacy to be a part of this patient's care.  Hovey-Rankin, Lulubelle Simcoe 01/31/2016 9:13 PM

## 2016-01-31 NOTE — Progress Notes (Signed)
S:  cramping, contractions.   O:  Vitals      Vitals:   01/31/16 0432 01/31/16 1001  BP: 129/80 (!) 144/70  Pulse: 81 87  Resp: 18   Temp: 98 F (36.7 C)     Gen: NAD SVE: 2/50/-2, crib out. AROM of forebag for clear fluid FHT: cat 1 Toco: q 5 min  A/P: 30 yo here w/SROM @ 38.5 wga.  # SROM:  S/p cytotec x 2 and crib Pitocin now  # MWB: H/o anxiety, no meds currently. H/o asthma well controlled. 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  CLE  # FWB: Cat 1 tracing EFW 7#  # GBS pos: PCN per protocol  Lucillie Garfinkel MD

## 2016-02-01 ENCOUNTER — Encounter (HOSPITAL_COMMUNITY): Payer: Self-pay

## 2016-02-01 LAB — CBC
HCT: 32.2 % — ABNORMAL LOW (ref 36.0–46.0)
Hemoglobin: 10.8 g/dL — ABNORMAL LOW (ref 12.0–15.0)
MCH: 28.9 pg (ref 26.0–34.0)
MCHC: 33.5 g/dL (ref 30.0–36.0)
MCV: 86.1 fL (ref 78.0–100.0)
PLATELETS: 231 10*3/uL (ref 150–400)
RBC: 3.74 MIL/uL — ABNORMAL LOW (ref 3.87–5.11)
RDW: 14.2 % (ref 11.5–15.5)
WBC: 22.3 10*3/uL — ABNORMAL HIGH (ref 4.0–10.5)

## 2016-02-01 MED ORDER — IBUPROFEN 600 MG PO TABS
600.0000 mg | ORAL_TABLET | Freq: Four times a day (QID) | ORAL | Status: DC
  Administered 2016-02-01 – 2016-02-02 (×6): 600 mg via ORAL
  Filled 2016-02-01 (×6): qty 1

## 2016-02-01 MED ORDER — ONDANSETRON HCL 4 MG PO TABS
4.0000 mg | ORAL_TABLET | ORAL | Status: DC | PRN
  Administered 2016-02-01: 4 mg via ORAL
  Filled 2016-02-01: qty 1

## 2016-02-01 MED ORDER — DIBUCAINE 1 % RE OINT: 1.0000 "application " | TOPICAL_OINTMENT | RECTAL | Status: DC | PRN

## 2016-02-01 MED ORDER — SIMETHICONE 80 MG PO CHEW: 80.0000 mg | CHEWABLE_TABLET | ORAL | Status: DC | PRN

## 2016-02-01 MED ORDER — WITCH HAZEL-GLYCERIN EX PADS: 1.0000 "application " | MEDICATED_PAD | CUTANEOUS | Status: DC | PRN

## 2016-02-01 MED ORDER — ONDANSETRON HCL 4 MG/2ML IJ SOLN: 4.0000 mg | INTRAMUSCULAR | Status: DC | PRN

## 2016-02-01 MED ORDER — COCONUT OIL OIL: 1.0000 "application " | TOPICAL_OIL | Status: DC | PRN

## 2016-02-01 MED ORDER — ZOLPIDEM TARTRATE 5 MG PO TABS: 5.0000 mg | ORAL_TABLET | Freq: Every evening | ORAL | Status: DC | PRN

## 2016-02-01 MED ORDER — DIPHENHYDRAMINE HCL 25 MG PO CAPS: 25.0000 mg | ORAL_CAPSULE | Freq: Four times a day (QID) | ORAL | Status: DC | PRN

## 2016-02-01 MED ORDER — BENZOCAINE-MENTHOL 20-0.5 % EX AERO
1.0000 "application " | INHALATION_SPRAY | CUTANEOUS | Status: DC | PRN
  Filled 2016-02-01: qty 56

## 2016-02-01 MED ORDER — SENNOSIDES-DOCUSATE SODIUM 8.6-50 MG PO TABS
2.0000 | ORAL_TABLET | ORAL | Status: DC
  Administered 2016-02-02: 2 via ORAL
  Filled 2016-02-01: qty 2

## 2016-02-01 MED ORDER — TETANUS-DIPHTH-ACELL PERTUSSIS 5-2.5-18.5 LF-MCG/0.5 IM SUSP: 0.5000 mL | Freq: Once | INTRAMUSCULAR | Status: DC

## 2016-02-01 MED ORDER — ACETAMINOPHEN 325 MG PO TABS
650.0000 mg | ORAL_TABLET | ORAL | Status: DC | PRN
  Administered 2016-02-01 – 2016-02-02 (×2): 650 mg via ORAL
  Filled 2016-02-01 (×2): qty 2

## 2016-02-01 MED ORDER — PRENATAL MULTIVITAMIN CH
1.0000 | ORAL_TABLET | Freq: Every day | ORAL | Status: DC
  Administered 2016-02-01 – 2016-02-02 (×2): 1 via ORAL
  Filled 2016-02-01 (×2): qty 1

## 2016-02-01 NOTE — Progress Notes (Signed)
Pt s/p wet tap.  C/O postural headache.  We discussed treatment options.  At this point she prefers conservative therapy.  She will communicate with her nurse and contact us if she changes her mind.  I am available to perform the EBP today and she will re-evaluate her decision before discharge tomorrow am.

## 2016-02-01 NOTE — Anesthesia Postprocedure Evaluation (Signed)
Anesthesia Post Note  Patient: Meagan Chung  Procedure(s) Performed: * No procedures listed *  Patient location during evaluation: Mother Baby Anesthesia Type: Epidural Level of consciousness: awake and alert, oriented and patient cooperative Pain management: pain level controlled Vital Signs Assessment: post-procedure vital signs reviewed and stable Respiratory status: spontaneous breathing Cardiovascular status: stable Postop Assessment: no headache, epidural receding, patient able to bend at knees and no signs of nausea or vomiting Anesthetic complications: yes Anesthetic complication details: spinal headacheComments: Pt c/o HA.  Dr. Tobias Alexander MD aware.  Spinal fluid noted during epidural placement.     Last Vitals:  Vitals:   02/01/16 0250 02/01/16 0650  BP: (!) 127/50 120/70  Pulse: (!) 113 95  Resp: 18 20  Temp: 36.8 C 36.8 C    Last Pain:  Vitals:   02/01/16 0654  TempSrc:   PainSc: 8    Pain Goal: Patients Stated Pain Goal: 0 (01/30/16 2045)               [name]

## 2016-02-01 NOTE — Lactation Note (Signed)
This note was copied from a baby's chart. Lactation Consultation Note  P1, Baby 79 hours old.  P1. Reviewed hand expression and demonstrated how to spoon feed. Short shaft nipples that evert w/ stimulation. Assisted w/ both cross cradle and football hold. Reviewed depth and positioning.  Encouraged mother to compress breast to achieve a deeper latch. Baby latched on both breasts for approx 30 min total. Noted bruise on L nipple when baby slipped down. Noted labial frenulum tightness and some tongue thrusting. Mother fell more confident about latching after assistance. Mom encouraged to feed baby 8-12 times/24 hours and with feeding cues.  Mom made aware of O/P services, breastfeeding support groups, community resources, and our phone # for post-discharge questions.  Provided mother w/ hand pump and encouraged them to watch Channel [employer].  Patient Name: Meagan Chung [id number] Date: 02/01/2016 Reason for consult: Initial assessment   Maternal Data Has patient been taught Hand Expression?: No Does the patient have breastfeeding experience prior to this delivery?: No  Feeding Feeding Type: Breast Fed Length of feed: 30 min  LATCH Score/Interventions Latch: Repeated attempts needed to sustain latch, nipple held in mouth throughout feeding, stimulation needed to elicit sucking reflex. Intervention(s): Adjust position;Assist with latch;Breast massage;Breast compression  Audible Swallowing: Spontaneous and intermittent  Type of Nipple: Everted at rest and after stimulation  Comfort (Breast/Nipple): Soft / non-tender     Hold (Positioning): Assistance needed to correctly position infant at breast and maintain latch.  LATCH Score: 8  Lactation Tools Discussed/Used     Consult Status Consult Status: Follow-up Date: 02/02/16 Follow-up type: In-patient    Vivianne Master [facility] 02/01/2016, 2:52 PM

## 2016-02-01 NOTE — Progress Notes (Signed)
MOB was referred for history of depression/anxiety.  Referral is screened out by Clinical Social Worker because none of the following criteria appear to apply and there are no reports impacting the pregnancy or her transition to the postpartum period.  CSW does not deem it clinically necessary to further investigate at this time.   -History of anxiety/depression during this pregnancy, or of post-partum depression.  - Diagnosis of anxiety and/or depression within last 3 years.-  - History of depression due to pregnancy loss/loss of child or -MOB's symptoms are currently being treated with medication and/or therapy.  Please contact the Clinical Social Worker if needs arise or upon MOB request.    Devan Babino, MSW, LCSW-A Clinical Social Worker  [facility]  Office: [phone]   

## 2016-02-01 NOTE — Progress Notes (Signed)
S: No complaints except moderate perineal pain c/w ibuprofen and strong HA which she was told was a spinal HA (positional). Feeling well. Lochia appropriate. No subjective fevers/chills. Having trouble with Laretta Alstrom latching and lactation is to see her today.   O:  Vitals:   02/01/16 0250 02/01/16 0650  BP: (!) 127/50 120/70  Pulse: (!) 113 95  Resp: 18 20  Temp: 98.2 F (36.8 C) 98.2 F (36.8 C)    Gen: NAD, A&O Pulm: NWOB Abd: soft, appropriately ttp, fundus firm and below Umb Ext: No evidence of DVT, trace edema b/l  Labs CBC    Component Value Date/Time   WBC 22.3 (H) 02/01/2016 0605   RBC 3.74 (L) 02/01/2016 0605   HGB 10.8 (L) 02/01/2016 0605   HCT 32.2 (L) 02/01/2016 0605   PLT 231 02/01/2016 0605   MCV 86.1 02/01/2016 0605   MCH 28.9 02/01/2016 0605   MCHC 33.5 02/01/2016 0605   RDW 14.2 02/01/2016 0605   LYMPHSABS 2.5 11/09/2014 2046   MONOABS 0.6 11/09/2014 2046   EOSABS 1.0 (H) 11/09/2014 2046   BASOSABS 0.1 11/09/2014 2046    A/P:  PPD1 s/p SVD, doing well pp. Had chorio and has been AF since delivery. Tachycardia intrapartum likely s/s chorio, now resolved. Benign exam. Anesthesia to see for spinal HA - anticipate blood patch.  Continue present care. Plan for d/c PPD#2.   Lucillie Garfinkel MD

## 2016-02-02 ENCOUNTER — Inpatient Hospital Stay (HOSPITAL_COMMUNITY): Payer: Managed Care, Other (non HMO) | Admitting: Anesthesiology

## 2016-02-02 MED ORDER — MIDAZOLAM HCL 2 MG/2ML IJ SOLN
2.0000 mg | Freq: Once | INTRAMUSCULAR | Status: AC | PRN
  Administered 2016-02-02: 1 mg via INTRAVENOUS

## 2016-02-02 MED ORDER — KETOROLAC TROMETHAMINE 30 MG/ML IJ SOLN: 30.0000 mg | Freq: Once | INTRAMUSCULAR | Status: DC

## 2016-02-02 MED ORDER — MIDAZOLAM HCL 2 MG/2ML IJ SOLN
INTRAMUSCULAR | Status: AC
  Administered 2016-02-02: 1 mg
  Filled 2016-02-02: qty 2

## 2016-02-02 MED ORDER — LACTATED RINGERS IV SOLN
INTRAVENOUS | Status: DC
  Administered 2016-02-02: 125 mL/h via INTRAVENOUS

## 2016-02-02 NOTE — Lactation Note (Signed)
This note was copied from a baby's chart. Lactation Consultation Note: Follow up visit. Mom reports baby last fed about 1 hour ago for 15 min. Baby asleep in bassinet and mom eating breakfast. Mom reports some pinching when baby is latched on. States RN thinks she may need a nipple shield. Encouraged to page for assist when baby wakes to feed.  Patient Name: Meagan Chung [id number] Date: 02/02/2016 Reason for consult: Follow-up assessment   Maternal Data    Feeding Feeding Type: Breast Fed Length of feed: 0 min  LATCH Score/Interventions                      Lactation Tools Discussed/Used     Consult Status Consult Status: Follow-up Date: 02/02/16 Follow-up type: In-patient    Truddie Crumble 02/02/2016, 8:11 AM

## 2016-02-02 NOTE — Anesthesia Postprocedure Evaluation (Signed)
Anesthesia Post Note  Patient: Meagan Chung  Procedure(s) Performed: * No procedures listed *  Patient location during evaluation: PACU Anesthesia Type: Epidural Level of consciousness: awake Pain management: pain level controlled Vital Signs Assessment: post-procedure vital signs reviewed and stable Respiratory status: spontaneous breathing Cardiovascular status: stable Postop Assessment: no headache Anesthetic complications: no     Last Vitals:  Vitals:   02/02/16 1044 02/02/16 1100  BP: 129/76 131/66  Pulse: 96 92  Resp: 20 18  Temp: 36.6 C 37 C    Last Pain:  Vitals:   02/02/16 1111  TempSrc:   PainSc: 2    Pain Goal: Patients Stated Pain Goal: 0 (01/30/16 2045)               Finnbar Cedillos JR,JOHN Mateo Flow

## 2016-02-02 NOTE — Lactation Note (Signed)
This note was copied from a baby's chart. Lactation Consultation Note: Mom called for assist with latch. Mom with short nipples, compressible areolas.  Wearing shells Madeline awake and rooting. Assisted mom with positioning in football hold. Mom complaining of spinal headache. Baby took a few attempts to latch. Mom reports pinching which gets better after bottom lip is untucked. Nursed for 15 min on left breast. Assisted with latch to right. Again took a few attempts but then latched well and mom reports no pinching after bottom lip is untucked. Going off to sleep after 5 minutes. Encouragement given. To call for assist prn.   Patient Name: Girl Cailynn Lefrancois [id number] Date: 02/02/2016 Reason for consult: Follow-up assessment   Maternal Data Formula Feeding for Exclusion: No Has patient been taught Hand Expression?: Yes Does the patient have breastfeeding experience prior to this delivery?: No  Feeding Feeding Type: Breast Fed Length of feed: 20 min  LATCH Score/Interventions Latch: Grasps breast easily, tongue down, lips flanged, rhythmical sucking.  Audible Swallowing: A few with stimulation  Type of Nipple: Everted at rest and after stimulation  Comfort (Breast/Nipple): Filling, red/small blisters or bruises, mild/mod discomfort  Problem noted: Mild/Moderate discomfort Interventions (Mild/moderate discomfort): Hand massage;Hand expression  Hold (Positioning): Assistance needed to correctly position infant at breast and maintain latch. Intervention(s): Breastfeeding basics reviewed  LATCH Score: 7  Lactation Tools Discussed/Used WIC Program: No   Consult Status Consult Status: Follow-up Date: 02/03/16 Follow-up type: In-patient    Truddie Crumble 02/02/2016, 9:00 AM

## 2016-02-02 NOTE — Progress Notes (Signed)
Patient is still complaining about severe headache. Anesthesia will be placing blood patch today.  BP 129/84 (BP Location: Right Arm)   Pulse 66   Temp 98 F (36.7 C) (Oral)   Resp 17   Ht 5\' 6"  (1.676 m)   Wt 106.1 kg (234 lb)   LMP 05/05/2015   SpO2 98%   Breastfeeding? Unknown   BMI 37.77 kg/m  No results found for this or any previous visit (from the past 24 hour(s)). Abdomen is soft and non tender  IMPRESSION: PPD #1 Doing ok except for spinal headache Patient plans on blood patch Possibly discharge home tomorrow

## 2016-02-02 NOTE — Anesthesia Procedure Notes (Signed)
Epidural  Start time: 02/02/2016 9:40 AM End time: 02/02/2016 9:44 AM  Staffing Anesthesiologist: Lyn Hollingshead Performed: anesthesiologist   Preanesthetic Checklist Completed: patient identified, surgical consent, pre-op evaluation, timeout performed, IV checked, risks and benefits discussed and monitors and equipment checked  Epidural Patient position: sitting Prep: site prepped and draped and DuraPrep Patient monitoring: blood pressure, continuous pulse ox and heart rate Approach: midline Location: L3-L4 Injection technique: LOR air  Needle:  Needle type: Tuohy  Needle gauge: 17 G Needle length: 9 cm Needle insertion depth: 6 cm  Assessment Events: injection painful  Additional Notes Painful after 15cc so injection stopped . Dr. Royce Macadamia withdrew 20cc of autologous blood from the patients R ACF under sterile conditions. The patient tolerated the procedure well and was laid supine.Reason for block:Epidural Blood Patch

## 2016-02-02 NOTE — Progress Notes (Signed)
Patient tolerated procedure well. Has been resting quietly in PACU for 1 hour post procedure.Rates HA pain at "2" and states she is feeling much better. Post Epidural Blood patch instructions reviewed. Evaluated by Dr Jillyn Hidden and cleared for discharge to floor.VSS, documented on flow sheet.

## 2016-02-02 NOTE — Anesthesia Preprocedure Evaluation (Signed)
Anesthesia Evaluation  Patient identified by MRN, date of birth, ID band Patient awake    Reviewed: Allergy & Precautions, H&P , NPO status , Patient's Chart, lab work & pertinent test results  Airway Mallampati: II       Dental no notable dental hx.    Pulmonary    Pulmonary exam normal        Cardiovascular Normal cardiovascular exam     Neuro/Psych  Headaches, Post dural puncture headache 12/2    GI/Hepatic Neg liver ROS,   Endo/Other  negative endocrine ROS  Renal/GU negative Renal ROS     Musculoskeletal negative musculoskeletal ROS (+)   Abdominal (+) + obese,   Peds  Hematology negative hematology ROS (+)   Anesthesia Other Findings   Reproductive/Obstetrics                             Anesthesia Physical Anesthesia Plan  ASA: II  Anesthesia Plan: Epidural   Post-op Pain Management:    Induction:   Airway Management Planned:   Additional Equipment:   Intra-op Plan:   Post-operative Plan:   Informed Consent: I have reviewed the patients History and Physical, chart, labs and discussed the procedure including the risks, benefits and alternatives for the proposed anesthesia with the patient or authorized representative who has indicated his/her understanding and acceptance.     Plan Discussed with:   Anesthesia Plan Comments: (Epidural blood patch for PDPH.)        Anesthesia Quick Evaluation

## 2016-02-02 NOTE — Discharge Summary (Signed)
Obstetric Discharge Summary Reason for Admission: rupture of membranes Prenatal Procedures: none Intrapartum Procedures: spontaneous vaginal delivery Postpartum Procedures: blood patch Complications-Operative and Postpartum: 2nd degree perineal laceration, pelvic infection and spinal headache Hemoglobin  Date Value Ref Range Status  02/01/2016 10.8 (L) 12.0 - 15.0 g/dL Final   HCT  Date Value Ref Range Status  02/01/2016 32.2 (L) 36.0 - 46.0 % Final    Physical Exam:  General: alert, cooperative and appears stated age 56: appropriate Uterine Fundus: firm Incision: healing well, no significant drainage, no dehiscence DVT Evaluation: No evidence of DVT seen on physical exam.  Discharge Diagnoses: Term Pregnancy-delivered and Amnionitis  Discharge Information: Date: 02/02/2016 Activity: pelvic rest Diet: routine Medications: None Condition: improved Instructions: refer to practice specific booklet Discharge to: home   Newborn Data: Live born female  Birth Weight: 6 lb 12.8 oz (3085 g) APGAR: 8, 9  Home with mother.  Meagan Chung L 02/02/2016, 12:55 PM

## 2016-02-04 ENCOUNTER — Encounter (HOSPITAL_COMMUNITY): Payer: Self-pay | Admitting: Anesthesiology

## 2016-02-04 ENCOUNTER — Ambulatory Visit (HOSPITAL_COMMUNITY)
Admission: RE | Admit: 2016-02-04 | Discharge: 2016-02-04 | Disposition: A | Discharge status: Home / Self Care | Payer: Managed Care, Other (non HMO) | Source: Ambulatory Visit | Attending: Anesthesiology | Admitting: Anesthesiology

## 2016-02-04 DIAGNOSIS — R51 Headache: Secondary | ICD-10-CM | POA: Diagnosis present

## 2016-02-04 MED ORDER — MIDAZOLAM HCL 2 MG/2ML IJ SOLN
INTRAMUSCULAR | Status: AC
  Administered 2016-02-04: 2 mg
  Filled 2016-02-04: qty 2

## 2016-02-04 MED ORDER — FENTANYL CITRATE (PF) 100 MCG/2ML IJ SOLN
INTRAMUSCULAR | Status: AC
  Administered 2016-02-04: 100 ug
  Filled 2016-02-04: qty 2

## 2016-02-04 MED ORDER — MIDAZOLAM HCL 2 MG/2ML IJ SOLN: 2.0000 mg | Freq: Once | INTRAMUSCULAR | Status: DC | PRN

## 2016-02-04 MED ORDER — FENTANYL CITRATE (PF) 100 MCG/2ML IJ SOLN: 100.0000 ug | Freq: Once | INTRAMUSCULAR | Status: DC | PRN

## 2016-02-04 MED ORDER — MIDAZOLAM HCL 2 MG/2ML IJ SOLN: 1.0000 mg | Freq: Once | INTRAMUSCULAR | Status: DC

## 2016-02-04 MED ORDER — LACTATED RINGERS IV SOLN
INTRAVENOUS | Status: DC
  Administered 2016-02-04: 1000 mL via INTRAVENOUS
  Administered 2016-02-04: 16:00:00 via INTRAVENOUS

## 2016-02-04 NOTE — Progress Notes (Signed)
Patient's VSS. No headache, sore at epidural blood patch area-lower mid back.

## 2016-02-04 NOTE — Anesthesia Postprocedure Evaluation (Signed)
Anesthesia Post Note  Patient: Meagan Chung  Procedure(s) Performed: * No procedures listed *  Patient location during evaluation: PACU Anesthesia Type: Epidural Level of consciousness: awake and alert and oriented Pain management: pain level controlled Vital Signs Assessment: post-procedure vital signs reviewed and stable Respiratory status: spontaneous breathing, nonlabored ventilation and respiratory function stable Cardiovascular status: blood pressure returned to baseline and stable Postop Assessment: no headache and no signs of nausea or vomiting Anesthetic complications: no     Last Vitals:  Vitals:   02/04/16 1640 02/04/16 1645  BP: 126/86 126/86  Pulse: 99 98  Temp:      Last Pain:  Vitals:   02/04/16 1645  PainSc: 2    Pain Goal:                 Phil Michels A.

## 2016-02-04 NOTE — Anesthesia Procedure Notes (Signed)
Epidural Patient location during procedure: holding area Start time: 02/04/2016 3:09 PM End time: 02/04/2016 3:23 PM  Staffing Anesthesiologist: Josephine Igo  Preanesthetic Checklist Completed: patient identified, site marked, surgical consent, pre-op evaluation, timeout performed, IV checked, risks and benefits discussed and monitors and equipment checked  Epidural Patient position: sitting Prep: site prepped and draped and DuraPrep Patient monitoring: continuous pulse ox and blood pressure Approach: midline Location: L3-L4 Injection technique: LOR air  Needle:  Needle type: Tuohy  Needle gauge: 17 G Needle length: 9 cm and 9 Needle insertion depth: 5 cm cm  Assessment Events: blood not aspirated, injection not painful, no injection resistance, negative IV test and no paresthesia  Additional Notes IV started in PACU and monitors applied. Patient sedated with versed and fentanyl.Patient placed in sitting position. Timeout performed. Epidural performed as above. Right AC vein ID'd and overlying skin prepped with Chloroprep and allowed to dry.  78ml of whole blood withdrawn from the vein in a sterile manner. Blood then injected into the epidural needle. Patient reported some pressure in her left leg and across her lower back and hips. Epidural needle withdrawn and patient placed supine. She reported relief of her HA.Reason for block:Epidural Blood Patch

## 2016-02-04 NOTE — Anesthesia Preprocedure Evaluation (Addendum)
Anesthesia Evaluation  Patient identified by MRN, date of birth, ID band Patient awake    Reviewed: Allergy & Precautions, Patient's Chart, lab work & pertinent test results  Airway Mallampati: II       Dental no notable dental hx. (+) Teeth Intact   Pulmonary asthma ,    Pulmonary exam normal breath sounds clear to auscultation       Cardiovascular negative cardio ROS Normal cardiovascular exam Rhythm:Regular Rate:Normal     Neuro/Psych  Headaches, PSYCHIATRIC DISORDERS Depression Post dural puncture HA- had LEBP 2 days ago, started developing postural HA last night, progressively worse throughout the day. Photophobia present. HA somewhat relieved by assuming supine position. Unrelieved by analgesics. negative neurological ROS     GI/Hepatic GERD  Medicated and Controlled,  Endo/Other    Renal/GU      Musculoskeletal   Abdominal (+) + obese,   Peds  Hematology   Anesthesia Other Findings   Reproductive/Obstetrics                             Anesthesia Physical Anesthesia Plan  ASA: II  Anesthesia Plan: Epidural   Post-op Pain Management:    Induction:   Airway Management Planned: Natural Airway  Additional Equipment:   Intra-op Plan:   Post-operative Plan:   Informed Consent: I have reviewed the patients History and Physical, chart, labs and discussed the procedure including the risks, benefits and alternatives for the proposed anesthesia with the patient or authorized representative who has indicated his/her understanding and acceptance.     Plan Discussed with: Anesthesiologist  Anesthesia Plan Comments: (Patient with recurrent PDPHA presents for repeat LEBP. Risks, benefits and alternatives of procedure discussed with patient.  Will proceed with lumbar epidural blood patch.)       Anesthesia Quick Evaluation

## 2016-05-13 ENCOUNTER — Ambulatory Visit (INDEPENDENT_AMBULATORY_CARE_PROVIDER_SITE_OTHER): Payer: Commercial Managed Care - PPO | Admitting: Medical

## 2016-05-13 ENCOUNTER — Encounter: Payer: Self-pay | Admitting: Medical

## 2016-05-13 VITALS — BP 119/64 | HR 87 | Temp 98.1°F | Resp 16 | Ht 66.0 in | Wt 207.0 lb

## 2016-05-13 DIAGNOSIS — J40 Bronchitis, not specified as acute or chronic: Secondary | ICD-10-CM

## 2016-05-13 DIAGNOSIS — J0100 Acute maxillary sinusitis, unspecified: Secondary | ICD-10-CM | POA: Diagnosis not present

## 2016-05-13 DIAGNOSIS — R05 Cough: Secondary | ICD-10-CM

## 2016-05-13 DIAGNOSIS — J029 Acute pharyngitis, unspecified: Secondary | ICD-10-CM | POA: Diagnosis not present

## 2016-05-13 DIAGNOSIS — R059 Cough, unspecified: Secondary | ICD-10-CM

## 2016-05-13 LAB — POCT RAPID STREP A (OFFICE): Rapid Strep A Screen: NEGATIVE

## 2016-05-13 LAB — POC INFLUENZA A&B (BINAX/QUICKVUE)
INFLUENZA A, POC: NEGATIVE
INFLUENZA B, POC: NEGATIVE

## 2016-05-13 MED ORDER — AZITHROMYCIN 250 MG PO TABS: ORAL_TABLET | ORAL | 0 refills | Status: DC

## 2016-05-13 NOTE — Progress Notes (Signed)
Subjective:    Patient ID: Meagan Chung, female    DOB: Jan 26, 1986, 31 y.o.   MRN: [id number]  HPI  Pt in for some recent head and chest congestion for 2 weeks. She thought better about 3 days ago but then flare of  symptoms. This time both ears hurt and coughing up dark brown mucous. Yellow mucous when blows her nose.   Pt works at a days [employer]. 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  Pt is breast feeding 75 week old.  No fever, no chills, no sweats or any body aches.     Review of Systems  Constitutional: Negative for chills, fatigue and fever.  HENT: Positive for congestion, sinus pain, sinus pressure and sore throat. Negative for ear pain.        Faint ear pressure. Faint st(like from pnd per pt)  Cardiovascular: Negative for chest pain and palpitations.  Gastrointestinal: Negative for abdominal pain.  Musculoskeletal: Negative for back pain, myalgias and neck stiffness.  Skin: Negative for rash.  Neurological: Negative for dizziness and headaches.  Hematological: Negative for adenopathy. Does not bruise/bleed easily.  Psychiatric/Behavioral: Negative for behavioral problems and confusion.   Past Medical History:  Diagnosis Date  . Anxiety and depression    anxiety, bulemia in past all well controlled   . Asthma   . Depression    anxiety, bulemia in past all well controlled  . Diarrhea 11/29/2014  . Hyperlipidemia, mixed 02/02/2015  . Thyroid nodule      Social History   Social History  . Marital status: Married    Spouse name: N/A  . Number of children: 0  . Years of education: N/A   Occupational History  . development    Social History Main Topics  . Smoking status: Never Smoker  . Smokeless tobacco: Never Used  . Alcohol use 0.0 oz/week     Comment: rarely  . Drug use: No  . Sexual activity: Yes     Comment: lives with fiance, fund raise for [employer], avoids gluten, minimize dairy,   Other  Topics Concern  . Not on file   Social History Narrative  . No narrative on file    Past Surgical History:  Procedure Laterality Date  . ANKLE SURGERY    . WISDOM TOOTH EXTRACTION Bilateral     Family History  Problem Relation Age of Onset  . Stroke Father     2  . Hyperlipidemia Father   . Anorexia nervosa Sister     78  . Kidney Stones Sister   . Thyroid disease Brother   . Obesity Brother   . Dementia Maternal Grandmother   . Osteoporosis Maternal Grandmother   . Cancer Maternal Grandmother     breast  . Cancer Maternal Grandfather     prostate  . Alcohol abuse Maternal Grandfather   . Cancer Paternal Grandmother 74    3 masses in pancreas and thyroid, suspect cancer  . Hyperlipidemia Paternal Grandmother     Allergies  Allergen Reactions  . Other Hives    Capers causes SOB Cats 4+    Current Outpatient Prescriptions on File Prior to Visit  Medication Sig Dispense Refill  . albuterol (PROVENTIL HFA;VENTOLIN HFA) 108 (90 BASE) MCG/ACT inhaler Inhale 1-2 puffs into the lungs every 6 (six) hours as needed for wheezing or shortness of breath.    . Prenatal Vit-Fe Fumarate-FA (PRENATAL MULTIVITAMIN) TABS tablet Take 1 tablet  by mouth daily at 12 noon.    . valACYclovir (VALTREX) 500 MG tablet TAKE 1 TABLET BY MOUTH TWICE DAILY FOR 3 DAYS THEN TAKE 1 TABLET BY MOUTH DAILY 35 tablet 0   No current facility-administered medications on file prior to visit.     BP 119/64 (BP Location: Right Arm, Patient Position: Sitting, Cuff Size: Large)   Pulse 87   Temp 98.1 F (36.7 C) (Oral)   Resp 16   Ht 5\' 6"  (1.676 m)   Wt 207 lb (93.9 kg)   LMP 04/13/2016   SpO2 99%   BMI 33.41 kg/m       Objective:   Physical Exam  General  Mental Status - Alert. General Appearance - Well groomed. Not in acute distress.  Skin Rashes- No Rashes.  HEENT Head- Normal. Ear Auditory Canal - Left- Normal. Right - Normal.Tympanic Membrane- Left- Normal. Right- Normal. Eye  Sclera/Conjunctiva- Left- Normal. Right- Normal. Nose & Sinuses Nasal Mucosa- Left-  Boggy and Congested. Right-  Boggy and  Congested.Bilateral maxillary and frontal sinus pressure. Mouth & Throat Lips: Upper Lip- Normal: no dryness, cracking, pallor, cyanosis, or vesicular eruption. Lower Lip-Normal: no dryness, cracking, pallor, cyanosis or vesicular eruption. Buccal Mucosa- Bilateral- No Aphthous ulcers. Oropharynx- No Discharge or Erythema. Tonsils: Characteristics- Bilateral- No Erythema or Congestion. Size/Enlargement- Bilateral- No enlargement. Discharge- bilateral-None.  Neck Neck- Supple. No Masses.     Chest and Lung Exam Auscultation: Breath Sounds:-Clear even and unlabored.  Cardiovascular Auscultation:Rythm- Regular, rate and rhythm. Murmurs & Other Heart Sounds:Ausculatation of the heart reveal- No Murmurs.  Lymphatic Head & Neck General Head & Neck Lymphatics: Bilateral: Description- No Localized lymphadenopathy.       Assessment & Plan:  Rapid strep and flu test both negative.(ma started test and was helpful in work up)  You appear to have bronchitis and sinusitis. Rest hydrate and tylenol for fever. I prescribing azithromycin antibiotic. For your nasal congestion can use saline nasal spray. Limitations due to breast feeding.  You should gradually get better. If not then notify us and would recommend a chest xray.  Follow up in 7-10 days or as needed  [name], Percell Miller, [employer]

## 2016-05-13 NOTE — Progress Notes (Signed)
Pre visit review using our clinic review tool, if applicable. No additional management support is needed unless otherwise documented below in the visit note/SLS  

## 2016-05-13 NOTE — Patient Instructions (Addendum)
You appear to have bronchitis and sinusitis. Rest hydrate and tylenol for fever. I prescribing azithromycin antibiotic. For your nasal congestion can use saline nasal spray. Some limitations on medications due to breast feeding.  You should gradually get better. If not then notify us and would recommend a chest xray.  Follow up in 7-10 days or as needed

## 2016-06-11 ENCOUNTER — Other Ambulatory Visit: Payer: Self-pay | Admitting: Family Medicine

## 2016-06-11 ENCOUNTER — Telehealth: Payer: Self-pay | Admitting: Family Medicine

## 2016-06-11 DIAGNOSIS — E079 Disorder of thyroid, unspecified: Secondary | ICD-10-CM

## 2016-06-11 NOTE — Telephone Encounter (Signed)
Patients baby is now 9 weeks old. Patients milk supply has lessend in the past couple weeks. She has heard nature throid helps---thyroid symptoms are back fatigue, rashes and itchy skin, hair loss. She use to take the nature throid before.

## 2016-06-11 NOTE — Telephone Encounter (Signed)
Caller name: Relationship to patient: Self Can be reached: [phone]  Pharmacy:  Reason for call: Request call back to discuss nursing her baby and medications.

## 2016-06-11 NOTE — Telephone Encounter (Signed)
Called the patient informed of PCP instructions. Scheduled lab appt for Monday 06/14/16//put in order.

## 2016-06-11 NOTE — Telephone Encounter (Signed)
Thyroid can do strange things after a pregnancy. Have her come in for TSH, free T4 and cbc due to thyroid disease and leukocytosis. Then we can decide what to do

## 2016-06-14 ENCOUNTER — Encounter: Payer: Self-pay | Admitting: Family Medicine

## 2016-06-14 ENCOUNTER — Other Ambulatory Visit (INDEPENDENT_AMBULATORY_CARE_PROVIDER_SITE_OTHER): Payer: Commercial Managed Care - PPO

## 2016-06-14 DIAGNOSIS — E079 Disorder of thyroid, unspecified: Secondary | ICD-10-CM

## 2016-06-14 LAB — CBC
HEMATOCRIT: 40.2 % (ref 36.0–46.0)
HEMOGLOBIN: 13.3 g/dL (ref 12.0–15.0)
MCHC: 33 g/dL (ref 30.0–36.0)
MCV: 87.3 fl (ref 78.0–100.0)
Platelets: 280 10*3/uL (ref 150.0–400.0)
RBC: 4.61 Mil/uL (ref 3.87–5.11)
RDW: 13.6 % (ref 11.5–15.5)
WBC: 6.7 10*3/uL (ref 4.0–10.5)

## 2016-06-14 LAB — TSH: TSH: 1.56 u[IU]/mL (ref 0.35–4.50)

## 2016-06-14 LAB — T4, FREE: Free T4: 0.8 ng/dL (ref 0.60–1.60)

## 2016-06-16 ENCOUNTER — Other Ambulatory Visit: Payer: Self-pay | Admitting: Family Medicine

## 2016-06-16 DIAGNOSIS — R7989 Other specified abnormal findings of blood chemistry: Secondary | ICD-10-CM

## 2016-06-16 MED ORDER — THYROID 15 MG PO TABS: 15.0000 mg | ORAL_TABLET | Freq: Every day | ORAL | 3 refills | Status: DC

## 2016-08-23 DIAGNOSIS — Z01419 Encounter for gynecological examination (general) (routine) without abnormal findings: Secondary | ICD-10-CM | POA: Diagnosis not present

## 2016-11-07 DIAGNOSIS — J028 Acute pharyngitis due to other specified organisms: Secondary | ICD-10-CM | POA: Diagnosis not present

## 2016-12-19 DIAGNOSIS — J069 Acute upper respiratory infection, unspecified: Secondary | ICD-10-CM | POA: Diagnosis not present

## 2016-12-21 ENCOUNTER — Ambulatory Visit (INDEPENDENT_AMBULATORY_CARE_PROVIDER_SITE_OTHER): Payer: Commercial Managed Care - PPO | Admitting: Internal Medicine

## 2016-12-21 ENCOUNTER — Encounter: Payer: Self-pay | Admitting: Internal Medicine

## 2016-12-21 VITALS — BP 126/68 | HR 85 | Temp 98.0°F | Resp 14 | Ht 66.0 in | Wt 220.0 lb

## 2016-12-21 DIAGNOSIS — J45901 Unspecified asthma with (acute) exacerbation: Secondary | ICD-10-CM

## 2016-12-21 MED ORDER — ALBUTEROL SULFATE HFA 108 (90 BASE) MCG/ACT IN AERS: 2.0000 | INHALATION_SPRAY | Freq: Four times a day (QID) | RESPIRATORY_TRACT | 3 refills | Status: DC | PRN

## 2016-12-21 MED ORDER — PREDNISONE 10 MG PO TABS: ORAL_TABLET | ORAL | 0 refills | Status: DC

## 2016-12-21 MED ORDER — AZITHROMYCIN 250 MG PO TABS: ORAL_TABLET | ORAL | 0 refills | Status: DC

## 2016-12-21 MED ORDER — ALBUTEROL SULFATE (2.5 MG/3ML) 0.083% IN NEBU: 2.5000 mg | INHALATION_SOLUTION | Freq: Four times a day (QID) | RESPIRATORY_TRACT | 1 refills | Status: DC | PRN

## 2016-12-21 NOTE — Patient Instructions (Signed)
Rest, fluids , tylenol  For cough:  Take Mucinex DM twice a day as needed until better  For Asthma: Albuterol: 2 puffs every 6 hours as needed. Also okay to use a nebulizer instead if symptoms severe   For nasal congestion: Use  OTC  Flonase : 2 nasal sprays on each side of the nose in the morning until you feel better   Avoid decongestants such as  Pseudoephedrine or phenylephrine   Take the antibiotic as prescribed  (zithromax)  Take prednisone for a few days  Call if not gradually better over the next  7 days  Call anytime if the symptoms are severe   Schedule a PCP visit in 4-5 weeks

## 2016-12-21 NOTE — Progress Notes (Signed)
Subjective:    Patient ID: Meagan Chung, female    DOB: 23-Jun-1985, 31 y.o.   MRN: [id number]  DOS:  12/21/2016 Type of visit - description : Acute visit Interval history: Cough for 2 weeks, was taking OTC medications  Such as Mucinex but symptoms did not get better, she started to wheeze approximately 3 days ago. Went to urgent care then, apparently got a Depo-Medrol IM,  got better for 24 hours but is again coughing and wheezing.  Review of Systems  Denies fever chills No nausea, vomiting, diarrhea No chest pain but some shortness of breath. No GERD symptoms. Admits to some nasal discharge.  Past Medical History:  Diagnosis Date  . Anxiety and depression    anxiety, bulemia in past all well controlled   . Asthma   . Depression    anxiety, bulemia in past all well controlled  . Diarrhea 11/29/2014  . Hyperlipidemia, mixed 02/02/2015  . Thyroid nodule     Past Surgical History:  Procedure Laterality Date  . ANKLE SURGERY    . WISDOM TOOTH EXTRACTION Bilateral     Social History   Social History  . Marital status: Married    Spouse name: N/A  . Number of children: 0  . Years of education: N/A   Occupational History  . development    Social History Main Topics  . Smoking status: Never Smoker  . Smokeless tobacco: Never Used  . Alcohol use 0.0 oz/week     Comment: rarely  . Drug use: No  . Sexual activity: Yes     Comment: lives with fiance, fund raise for [employer], avoids gluten, minimize dairy,   Other Topics Concern  . Not on file   Social History Narrative  . No narrative on file      Allergies as of 12/21/2016      Reactions   Other Hives   Capers causes SOB Cats 4+      Medication List       Accurate as of 12/21/16  7:29 PM. Always use your most recent med list.          albuterol 108 (90 Base) MCG/ACT inhaler Commonly known as:  VENTOLIN HFA Inhale 2 puffs into the lungs every 6 (six) hours as needed for wheezing or  shortness of breath.   albuterol (2.5 MG/3ML) 0.083% nebulizer solution Commonly known as:  PROVENTIL Take 3 mLs (2.5 mg total) by nebulization every 6 (six) hours as needed for wheezing or shortness of breath.   azithromycin 250 MG tablet Commonly known as:  ZITHROMAX Take 2 tablets by mouth on day 1, followed by 1 tablet by mouth daily for 4 days.   ORTHO TRI-CYCLEN LO 0.18/0.215/0.25 MG-25 MCG tab Generic drug:  Norgestimate-Ethinyl Estradiol Triphasic Take 1 tablet by mouth daily.   predniSONE 10 MG tablet Commonly known as:  DELTASONE 4 tablets x 2 days, 3 tabs x 2 days, 2 tabs x 2 days, 1 tab x 2 days   valACYclovir 500 MG tablet Commonly known as:  VALTREX TAKE 1 TABLET BY MOUTH TWICE DAILY FOR 3 DAYS THEN TAKE 1 TABLET BY MOUTH DAILY          Objective:   Physical Exam BP 126/68 (BP Location: Left Arm, Patient Position: Sitting, Cuff Size: Normal)   Pulse 85   Temp 98 F (36.7 C) (Oral)   Resp 14   Ht 5\' 6"  (1.676 m)   Wt 220 lb (99.8 kg)  SpO2 98%   Breastfeeding? No   BMI 35.51 kg/m  General:   Well developed, well nourished . NAD.  HEENT:  Normocephalic . Face symmetric, atraumatic. TMs normal. Nose is slightly congested. Throat symmetric. Lungs:  Prolonged expiratory time, few rhonchi Normal respiratory effort, no intercostal retractions, no accessory muscle use. Heart: RRR,  no murmur.  No pretibial edema bilaterally  Skin: Not pale. Not jaundice Neurologic:  alert & oriented X3.  Speech normal, gait appropriate for age and unassisted Psych--  Cognition and judgment appear intact.  Cooperative with normal attention span and concentration.  Behavior appropriate. No anxious or depressed appearing.      Assessment & Plan:   31 year old female with a history of asthma, s/p  admission to ICU due to pneumonia in 2016, GERD, P1 G1, delivered 01-2016, on birth control pills, not breast-feeding, h/o depression/anxiety presents with  Mild asthma  exacerbation: Asthma exacerbation by history, she is not wheezing much today, she does have a history of ICU admission. Asthma has been okay since she delivered the baby with no  recurrent symptoms. Plan: Prednisone by mouth, Zithromax, continue albuterol, also she needs albuterol nebs just in case, we'll refill. Call if not improving, ER if symptoms severe.Marland Kitchen

## 2016-12-21 NOTE — Progress Notes (Signed)
Pre visit review using our clinic review tool, if applicable. No additional management support is needed unless otherwise documented below in the visit note. 

## 2017-01-09 DIAGNOSIS — R0982 Postnasal drip: Secondary | ICD-10-CM | POA: Diagnosis not present

## 2017-01-09 DIAGNOSIS — J3089 Other allergic rhinitis: Secondary | ICD-10-CM | POA: Diagnosis not present

## 2017-01-09 DIAGNOSIS — J45901 Unspecified asthma with (acute) exacerbation: Secondary | ICD-10-CM | POA: Diagnosis not present

## 2017-01-24 ENCOUNTER — Encounter: Payer: Self-pay | Admitting: Family Medicine

## 2017-01-24 ENCOUNTER — Ambulatory Visit (INDEPENDENT_AMBULATORY_CARE_PROVIDER_SITE_OTHER): Payer: Commercial Managed Care - PPO | Admitting: Family Medicine

## 2017-01-24 VITALS — BP 124/62 | HR 78 | Temp 98.1°F | Resp 16 | Ht 66.14 in | Wt 209.8 lb

## 2017-01-24 DIAGNOSIS — F329 Major depressive disorder, single episode, unspecified: Secondary | ICD-10-CM

## 2017-01-24 DIAGNOSIS — F419 Anxiety disorder, unspecified: Secondary | ICD-10-CM

## 2017-01-24 DIAGNOSIS — J45909 Unspecified asthma, uncomplicated: Secondary | ICD-10-CM

## 2017-01-24 DIAGNOSIS — F32A Depression, unspecified: Secondary | ICD-10-CM

## 2017-01-24 DIAGNOSIS — D229 Melanocytic nevi, unspecified: Secondary | ICD-10-CM

## 2017-01-24 MED ORDER — METHYLPREDNISOLONE 4 MG PO TABS
ORAL_TABLET | ORAL | 0 refills | Status: DC
Start: 2017-01-24 — End: 2017-03-08

## 2017-01-24 MED ORDER — MONTELUKAST SODIUM 10 MG PO TABS: 10.0000 mg | ORAL_TABLET | Freq: Every evening | ORAL | 3 refills | Status: DC | PRN

## 2017-01-24 MED ORDER — CEFDINIR 300 MG PO CAPS: 300.0000 mg | ORAL_CAPSULE | Freq: Two times a day (BID) | ORAL | 0 refills | Status: AC

## 2017-01-24 NOTE — Progress Notes (Signed)
Subjective:  I acted as a Education administrator for [employer]. Yancey Flemings, [place]   Patient ID: Meagan Chung, female    DOB: Mar 08, 1985, 31 y.o.   MRN: [id number]  Chief Complaint  Patient presents with  . Follow-up    HPI  Patient is in today for follow up visit. She is doing well today but does note she has frequent asthma exacerbations this time of year typically.  She has had to stop her Advair secondary to it being cost prohibitive now that her insurance will not cover it.  She has used it since childhood with good results.  She has no acute concerns otherwise at today's visit. Denies CP/palp/SOB/HA/congestion/fevers/GI or GU c/o. Taking meds as prescribed  Patient Care Team: Mosie Lukes, MD as PCP - General (Family Medicine) Ross Marcus, MD as Consulting Physician (Pulmonary Disease)   Past Medical History:  Diagnosis Date  . Anxiety and depression    anxiety, bulemia in past all well controlled   . Asthma   . Depression    anxiety, bulemia in past all well controlled  . Diarrhea 11/29/2014  . Hyperlipidemia, mixed 02/02/2015  . Thyroid nodule     Past Surgical History:  Procedure Laterality Date  . ANKLE SURGERY    . WISDOM TOOTH EXTRACTION Bilateral     Family History  Problem Relation Age of Onset  . Stroke Father        2  . Hyperlipidemia Father   . Anorexia nervosa Sister        70  . Kidney Stones Sister   . Thyroid disease Brother   . Obesity Brother   . Dementia Maternal Grandmother   . Osteoporosis Maternal Grandmother   . Cancer Maternal Grandmother        breast  . Cancer Maternal Grandfather        prostate  . Alcohol abuse Maternal Grandfather   . Cancer Paternal Grandmother 49       3 masses in pancreas and thyroid, suspect cancer  . Hyperlipidemia Paternal Grandmother     Social History   Socioeconomic History  . Marital status: Married    Spouse name: Not on file  . Number of children: 0  . Years of education: Not on file  . Highest  education level: Not on file  Social Needs  . Financial resource strain: Not on file  . Food insecurity - worry: Not on file  . Food insecurity - inability: Not on file  . Transportation needs - medical: Not on file  . Transportation needs - non-medical: Not on file  Occupational History  . Occupation: development  Tobacco Use  . Smoking status: Never Smoker  . Smokeless tobacco: Never Used  Substance and Sexual Activity  . Alcohol use: Yes    Alcohol/week: 0.0 oz    Comment: rarely  . Drug use: No  . 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 Not on file  Social History Narrative  . Not on file    Outpatient Medications Prior to Visit  Medication Sig Dispense Refill  . albuterol (PROVENTIL) (2.5 MG/3ML) 0.083% nebulizer solution Take 3 mLs (2.5 mg total) by nebulization every 6 (six) hours as needed for wheezing or shortness of breath. 150 mL 1  . albuterol (VENTOLIN HFA) 108 (90 Base) MCG/ACT inhaler Inhale 2 puffs into the lungs every 6 (six) hours as needed for  wheezing or shortness of breath. 1 Inhaler 3  . Norgestimate-Ethinyl Estradiol Triphasic (ORTHO TRI-CYCLEN LO) 0.18/0.215/0.25 MG-25 MCG tab Take 1 tablet by mouth daily.    . predniSONE (DELTASONE) 10 MG tablet 4 tablets x 2 days, 3 tabs x 2 days, 2 tabs x 2 days, 1 tab x 2 days 20 tablet 0  . valACYclovir (VALTREX) 500 MG tablet TAKE 1 TABLET BY MOUTH TWICE DAILY FOR 3 DAYS THEN TAKE 1 TABLET BY MOUTH DAILY (Patient not taking: Reported on 12/21/2016) 35 tablet 0  . azithromycin (ZITHROMAX) 250 MG tablet Take 2 tablets by mouth on day 1, followed by 1 tablet by mouth daily for 4 days. 6 tablet 0   No facility-administered medications prior to visit.     Allergies  Allergen Reactions  . Other Hives    Capers causes SOB Cats 4+    Review of Systems  Constitutional: Negative for fever and malaise/fatigue.  HENT:  Negative for congestion.   Eyes: Negative for blurred vision.  Respiratory: Negative for shortness of breath.   Cardiovascular: Negative for chest pain, palpitations and leg swelling.  Gastrointestinal: Negative for abdominal pain, blood in stool and nausea.  Genitourinary: Negative for dysuria and frequency.  Musculoskeletal: Negative for falls.  Skin: Negative for rash.  Neurological: Negative for dizziness, loss of consciousness and headaches.  Endo/Heme/Allergies: Negative for environmental allergies.  Psychiatric/Behavioral: Negative for depression. The patient is not nervous/anxious.        Objective:    Physical Exam  Constitutional: She is oriented to person, place, and time. She appears well-developed and well-nourished. No distress.  HENT:  Head: Normocephalic and atraumatic.  Nose: Nose normal.  Eyes: Right eye exhibits no discharge. Left eye exhibits no discharge.  Neck: Normal range of motion. Neck supple.  Cardiovascular: Normal rate and regular rhythm.  No murmur heard. Pulmonary/Chest: Effort normal and breath sounds normal.  Abdominal: Soft. Bowel sounds are normal. There is no tenderness.  Musculoskeletal: She exhibits no edema.  Neurological: She is alert and oriented to person, place, and time.  Skin: Skin is warm and dry.  Psychiatric: She has a normal mood and affect.  Nursing note and vitals reviewed.   BP 124/62 (BP Location: Left Arm, Patient Position: Sitting, Cuff Size: Large)   Pulse 78   Temp 98.1 F (36.7 C) (Oral)   Resp 16   Ht 5' 6.14" (1.68 m)   Wt 209 lb 12.8 oz (95.2 kg)   BMI 33.72 kg/m  Wt Readings from Last 3 Encounters:  01/24/17 209 lb 12.8 oz (95.2 kg)  12/21/16 220 lb (99.8 kg)  05/13/16 207 lb (93.9 kg)   BP Readings from Last 3 Encounters:  01/24/17 124/62  12/21/16 126/68  05/13/16 119/64     Immunization History  Administered Date(s) Administered  . HPV Quadrivalent 06/18/2010, 08/19/2010  . Hepatitis A  05/22/2014  . Influenza-Unspecified 02/01/2013, 12/17/2016  . Pneumococcal Conjugate-13 02/01/2013  . Pneumococcal Polysaccharide-23 04/01/2013, 05/03/2013  . Tdap 05/22/2014  . Typhoid Inactivated 05/22/2014  . Yellow Fever 05/22/2014    Health Maintenance  Topic Date Due  . PAP SMEAR  08/22/2019  . TETANUS/TDAP  05/21/2024  . INFLUENZA VACCINE  Completed  . HIV Screening  Completed    Lab Results  Component Value Date   WBC 6.7 06/14/2016   HGB 13.3 06/14/2016   HCT 40.2 06/14/2016   PLT 280.0 06/14/2016   GLUCOSE 80 01/31/2015   CHOL 155 01/31/2015   TRIG 44.0 01/31/2015  HDL 45.90 01/31/2015   LDLCALC 100 (H) 01/31/2015   ALT 13 01/31/2015   AST 14 01/31/2015   NA 139 01/31/2015   K 4.4 01/31/2015   CL 106 01/31/2015   CREATININE 0.79 01/31/2015   BUN 13 01/31/2015   CO2 26 01/31/2015   TSH 1.56 06/14/2016   HGBA1C 5.2 01/31/2015    Lab Results  Component Value Date   TSH 1.56 06/14/2016   Lab Results  Component Value Date   WBC 6.7 06/14/2016   HGB 13.3 06/14/2016   HCT 40.2 06/14/2016   MCV 87.3 06/14/2016   PLT 280.0 06/14/2016   Lab Results  Component Value Date   NA 139 01/31/2015   K 4.4 01/31/2015   CO2 26 01/31/2015   GLUCOSE 80 01/31/2015   BUN 13 01/31/2015   CREATININE 0.79 01/31/2015   BILITOT 0.6 01/31/2015   ALKPHOS 77 01/31/2015   AST 14 01/31/2015   ALT 13 01/31/2015   PROT 7.5 01/31/2015   ALBUMIN 4.5 01/31/2015   CALCIUM 9.4 01/31/2015   ANIONGAP 7 11/11/2014   GFR 90.49 01/31/2015   Lab Results  Component Value Date   CHOL 155 01/31/2015   Lab Results  Component Value Date   HDL 45.90 01/31/2015   Lab Results  Component Value Date   LDLCALC 100 (H) 01/31/2015   Lab Results  Component Value Date   TRIG 44.0 01/31/2015   Lab Results  Component Value Date   CHOLHDL 3 01/31/2015   Lab Results  Component Value Date   HGBA1C 5.2 01/31/2015         Assessment & Plan:   Problem List Items Addressed This  Visit    Asthma - Primary    Doing well today but is struggling with the cost of her medication. Her Advair is no longer covered and that has worked well for her historically. Will try Symbicort and restart singulair. Given standing refill to take Medrol and Cefdinir bid prn      Relevant Medications   montelukast (SINGULAIR) 10 MG tablet   methylPREDNISolone (MEDROL) 4 MG tablet   budesonide-formoterol (SYMBICORT) 160-4.5 MCG/ACT inhaler   Other Relevant Orders   Ambulatory referral to Pulmonology   Anxiety and depression    stable       Other Visit Diagnoses    Skin mole       Relevant Orders   Ambulatory referral to Dermatology      I have discontinued Noely Kuhnle. Alcalde's azithromycin. I am also having her start on montelukast, methylPREDNISolone, cefdinir, and budesonide-formoterol. Additionally, I am having her maintain her valACYclovir, Norgestimate-Ethinyl Estradiol Triphasic, albuterol, predniSONE, and albuterol.  Meds ordered this encounter  Medications  . montelukast (SINGULAIR) 10 MG tablet    Sig: Take 1 tablet (10 mg total) by mouth at bedtime as needed.    Dispense:  30 tablet    Refill:  3  . methylPREDNISolone (MEDROL) 4 MG tablet    Sig: 5 tab po qd X 1d then 4 tab po qd X 1d then 3 tab po qd X 1d then 2 tab po qd then 1 tab po qd    Dispense:  15 tablet    Refill:  0  . cefdinir (OMNICEF) 300 MG capsule    Sig: Take 1 capsule (300 mg total) by mouth 2 (two) times daily for 10 days.    Dispense:  20 capsule    Refill:  0  . budesonide-formoterol (SYMBICORT) 160-4.5 MCG/ACT inhaler  Sig: Inhale 2 puffs into the lungs 2 (two) times daily.    Dispense:  1 Inhaler    Refill:  3    CMA served as scribe during this visit. History, Physical and Plan performed by medical provider. Documentation and orders reviewed and attested to.  Penni Homans, MD

## 2017-01-24 NOTE — Patient Instructions (Addendum)
Check the formulary 1) if they will not pay for Advair will they pay for anything similar such as Symbicort ...  2) if not then single ingredient options include Flovent or Qvar Asthma, Adult Asthma is a condition of the lungs in which the airways tighten and narrow. Asthma can make it hard to breathe. Asthma cannot be cured, but medicine and lifestyle changes can help control it. Asthma may be started (triggered) by:  Animal skin flakes (dander).  Dust.  Cockroaches.  Pollen.  Mold.  Smoke.  Cleaning products.  Hair sprays or aerosol sprays.  Paint fumes or strong smells.  Cold air, weather changes, and winds.  Crying or laughing hard.  Stress.  Certain medicines or drugs.  Foods, such as dried fruit, potato chips, and sparkling grape juice.  Infections or conditions (colds, flu).  Exercise.  Certain medical conditions or diseases.  Exercise or tiring activities.  Follow these instructions at home:  Take medicine as told by your doctor.  Use a peak flow meter as told by your doctor. A peak flow meter is a tool that measures how well the lungs are working.  Record and keep track of the peak flow meter's readings.  Understand and use the asthma action plan. An asthma action plan is a written plan for taking care of your asthma and treating your attacks.  To help prevent asthma attacks: ? Do not smoke. Stay away from secondhand smoke. ? Change your heating and air conditioning filter often. ? Limit your use of fireplaces and wood stoves. ? Get rid of pests (such as roaches and mice) and their droppings. ? Throw away plants if you see mold on them. ? Clean your floors. Dust regularly. Use cleaning products that do not smell. ? Have someone vacuum when you are not home. Use a vacuum cleaner with a HEPA filter if possible. ? Replace carpet with wood, tile, or vinyl flooring. Carpet can trap animal skin flakes and dust. ? Use allergy-proof pillows, mattress  covers, and box spring covers. ? Wash bed sheets and blankets every week in hot water and dry them in a dryer. ? Use blankets that are made of polyester or cotton. ? Clean bathrooms and kitchens with bleach. If possible, have someone repaint the walls in these rooms with mold-resistant paint. Keep out of the rooms that are being cleaned and painted. ? Wash hands often. Contact a doctor if:  You have make a whistling sound when breaking (wheeze), have shortness of breath, or have a cough even if taking medicine to prevent attacks.  The colored mucus you cough up (sputum) is thicker than usual.  The colored mucus you cough up changes from clear or white to yellow, green, gray, or bloody.  You have problems from the medicine you are taking such as: ? A rash. ? Itching. ? Swelling. ? Trouble breathing.  You need reliever medicines more than 2-3 times a week.  Your peak flow measurement is still at 50-79% of your personal best after following the action plan for 1 hour.  You have a fever. Get help right away if:  You seem to be worse and are not responding to medicine during an asthma attack.  You are short of breath even at rest.  You get short of breath when doing very little activity.  You have trouble eating, drinking, or talking.  You have chest pain.  You have a fast heartbeat.  Your lips or fingernails start to turn blue.  You are  light-headed, dizzy, or faint.  Your peak flow is less than 50% of your personal best. This information is not intended to replace advice given to you by your health care provider. Make sure you discuss any questions you have with your health care provider. Document Released: 08/04/2007 Document Revised: 07/24/2015 Document Reviewed: 09/14/2012 Elsevier Interactive Patient Education  2017 [employer].

## 2017-01-26 MED ORDER — BUDESONIDE-FORMOTEROL FUMARATE 160-4.5 MCG/ACT IN AERO: 2.0000 | INHALATION_SPRAY | Freq: Two times a day (BID) | RESPIRATORY_TRACT | 3 refills | Status: DC

## 2017-01-26 NOTE — Assessment & Plan Note (Signed)
Doing well today but is struggling with the cost of her medication. Her Advair is no longer covered and that has worked well for her historically. Will try Symbicort and restart singulair. Given standing refill to take Medrol and Cefdinir bid prn

## 2017-01-26 NOTE — Assessment & Plan Note (Signed)
stable °

## 2017-02-15 ENCOUNTER — Encounter: Payer: Self-pay | Admitting: Internal Medicine

## 2017-02-15 ENCOUNTER — Ambulatory Visit (INDEPENDENT_AMBULATORY_CARE_PROVIDER_SITE_OTHER): Payer: Commercial Managed Care - PPO | Admitting: Internal Medicine

## 2017-02-15 VITALS — BP 126/70 | HR 74 | Temp 97.4°F | Resp 14 | Ht 66.14 in | Wt 213.5 lb

## 2017-02-15 DIAGNOSIS — J45901 Unspecified asthma with (acute) exacerbation: Secondary | ICD-10-CM | POA: Diagnosis not present

## 2017-02-15 MED ORDER — PANTOPRAZOLE SODIUM 40 MG PO TBEC: 40.0000 mg | DELAYED_RELEASE_TABLET | Freq: Every day | ORAL | 4 refills | Status: DC

## 2017-02-15 MED ORDER — ALBUTEROL SULFATE (2.5 MG/3ML) 0.083% IN NEBU: 2.5000 mg | INHALATION_SOLUTION | Freq: Four times a day (QID) | RESPIRATORY_TRACT | 1 refills | Status: DC | PRN

## 2017-02-15 NOTE — Progress Notes (Signed)
Subjective:    Patient ID: Meagan Chung, female    DOB: 1985/09/29, 31 y.o.   MRN: [id number]  DOS:  02/15/2017 Type of visit - description : acute Interval history: Was seen with asthma exacerbation on 12/21/2016, improved after prednisone, Zithromax and albuterol. Was seen 01/24/2017 by PCP, due to cost, Advair was changed to Symbicort. She was given a prescription for a Medrol pack and antibiotics to self start as needed. Got a severe exacerbation around 02/03/2017, was using the nebulizer multiple times a day. Eventually self started a Medrol pack 02/05/2017, she felt better. As soon as the Medrol pack "wear off" symptoms came back and she again requiring multiple nebulizations daily. 2 days ago she decided to self start prednisone, has taken 60 mg a day x2.  She feels better but is very concerned about getting worse as soon as she finished prednisone.   Review of Systems  Currently with no fever or chills. Sputum production is white and thick associated with cough. + Wheezing (now better with prednisone) She denies any heartburn. She moved to a new house May 2018, house was built in 1932; it has hardwood floors, she has changed the air filters, no obvious mold. No dust exposure  Past Medical History:  Diagnosis Date  . Anxiety and depression    anxiety, bulemia in past all well controlled   . Asthma   . Depression    anxiety, bulemia in past all well controlled  . Diarrhea 11/29/2014  . Hyperlipidemia, mixed 02/02/2015  . Thyroid nodule     Past Surgical History:  Procedure Laterality Date  . ANKLE SURGERY    . WISDOM TOOTH EXTRACTION Bilateral     Social History   Socioeconomic History  . Marital status: Married    Spouse name: Not on file  . Number of children: 0  . Years of education: Not on file  . Highest education level: Not on file  Social Needs  . Financial resource strain: Not on file  . Food insecurity - worry: Not on file  . Food insecurity -  inability: Not on file  . Transportation needs - medical: Not on file  . Transportation needs - non-medical: Not on file  Occupational History  . Occupation: development  Tobacco Use  . Smoking status: Never Smoker  . Smokeless tobacco: Never Used  Substance and Sexual Activity  . Alcohol use: Yes    Alcohol/week: 0.0 oz    Comment: rarely  . Drug use: No  . 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 Not on file  Social History Narrative  . Not on file      Allergies as of 02/15/2017      Reactions   Other Hives   Capers causes SOB Cats 4+      Medication List        Accurate as of 02/15/17 11:59 PM. Always use your most recent med list.          albuterol 108 (90 Base) MCG/ACT inhaler Commonly known as:  VENTOLIN HFA Inhale 2 puffs into the lungs every 6 (six) hours as needed for wheezing or shortness of breath.   albuterol (2.5 MG/3ML) 0.083% nebulizer solution Commonly known as:  PROVENTIL Take 3 mLs (2.5 mg total) by nebulization every 6 (six) hours as needed for wheezing or shortness of breath.   budesonide-formoterol 160-4.5 MCG/ACT inhaler Commonly  known as:  SYMBICORT Inhale 2 puffs into the lungs 2 (two) times daily.   methylPREDNISolone 4 MG tablet Commonly known as:  MEDROL 5 tab po qd X 1d then 4 tab po qd X 1d then 3 tab po qd X 1d then 2 tab po qd then 1 tab po qd   montelukast 10 MG tablet Commonly known as:  SINGULAIR Take 1 tablet (10 mg total) by mouth at bedtime as needed.   ORTHO TRI-CYCLEN LO 0.18/0.215/0.25 MG-25 MCG tab Generic drug:  Norgestimate-Ethinyl Estradiol Triphasic Take 1 tablet by mouth daily.   pantoprazole 40 MG tablet Commonly known as:  PROTONIX Take 1 tablet (40 mg total) by mouth daily before breakfast.   predniSONE 10 MG tablet Commonly known as:  DELTASONE 4 tablets x 2 days, 3 tabs x 2 days, 2 tabs x 2 days, 1  tab x 2 days   valACYclovir 500 MG tablet Commonly known as:  VALTREX TAKE 1 TABLET BY MOUTH TWICE DAILY FOR 3 DAYS THEN TAKE 1 TABLET BY MOUTH DAILY          Objective:   Physical Exam BP 126/70 (BP Location: Right Arm, Patient Position: Sitting, Cuff Size: Normal)   Pulse 74   Temp (!) 97.4 F (36.3 C) (Oral)   Resp 14   Ht 5' 6.14" (1.68 m)   Wt 213 lb 8 oz (96.8 kg)   SpO2 97%   BMI 34.31 kg/m  General:   Well developed, well nourished . NAD.  HEENT:  Normocephalic . Face symmetric, atraumatic. TMs normal, nose not congested. Lungs:  CTA B Normal respiratory effort, no intercostal retractions, no accessory muscle use. Heart: RRR,  no murmur.  No pretibial edema bilaterally  Skin: Not pale. Not jaundice Neurologic:  alert & oriented X3.  Speech normal, gait appropriate for age and unassisted Psych--  Cognition and judgment appear intact.  Cooperative with normal attention span and concentration.  Behavior appropriate. No anxious or depressed appearing.      Assessment & Plan:   31 year old female with a history of asthma, s/p  admission to ICU due to pneumonia in 2016, GERD, P1 G1, delivered 01-2016, on birth control pills, not breast-feeding, h/o depression/anxiety presents with  Asthma: I saw her 12/21/2016, was prescribed prednisone, Zithromax.  She improved. Subsequently saw her PCP 01/24/2017, due to cost Advair was switched to Symbicort Started w/ a exacerbation requiring multiple nebulizations around 02/03/2017, self started a Medrol pack, got better temporarily, but sxs resurface, put herself again on prednisone 02/13/2017, 60 mg qd for 2 days.  Currently is doing well but is afraid that asthma will come back soon as she decrease prednisone. Plan: -Continue Singulair, Symbicort 160 mg 2 puffs BID -Continue albuterol as needed.  Send a prescription for nebulizer solution - Prednisone for a few more days.  See AVS, she has plenty of prednisone 10 mg  tablets at home. -Refer to allergist or pulmonology.  Hopefully within the next 10 days -Proceed with standing order of abx -Add Protonix -Add Zyrtec at night. -ER if symptoms increase See PCP in 2 weeks

## 2017-02-15 NOTE — Progress Notes (Signed)
Pre visit review using our clinic review tool, if applicable. No additional management support is needed unless otherwise documented below in the visit note. 

## 2017-02-15 NOTE — Patient Instructions (Signed)
-  Continue Singulair, Symbicort 160 mg 2 puffs BID -Continue albuterol as needed.  - Prednisone:  40 mg daily x2 30 mg daily x2 20 mg daily x2 10 mg daily x2 -Add Protonix 1 tablet before breakfast -Add Zyrtec 10 mg OTC 1 tablet at night. -ER if symptoms increase -Start antibiotics  Please see PCP in   2 weeks if unable to see the specialist

## 2017-02-16 ENCOUNTER — Telehealth: Payer: Self-pay | Admitting: Internal Medicine

## 2017-02-16 NOTE — Telephone Encounter (Signed)
Copied from [place]. Topic: Quick Communication - See Telephone Encounter >> Feb 16, 2017  1:56 PM Bea Graff, NT wrote: CRM for notification. See Telephone encounter for: Pt saw Dr. Larose Kells yesterday and he prescribed Symbicort and it will be $350 per month with her insurance. Wants to see if something cheaper can be ordered? Already tried Advair and that was $500 per month. Tried to get pharmacy to help her go through different meds that were on her plan that could work but was told to call the drs office. Also is going to call her insurance company to see if they can help.  02/16/17.

## 2017-02-16 NOTE — Telephone Encounter (Signed)
Please advise 

## 2017-02-16 NOTE — Telephone Encounter (Signed)
LMOM informing Pt of recommendations below. Instructed her to call office at her convenience.

## 2017-02-16 NOTE — Telephone Encounter (Signed)
1.  She could try our pharmacy downstairs with a coupon. 2.  Other options are [facility], she needs to call her insurance and see if those meds are better covered.

## 2017-03-08 ENCOUNTER — Other Ambulatory Visit: Payer: Self-pay | Admitting: Family Medicine

## 2017-03-08 ENCOUNTER — Encounter: Payer: Self-pay | Admitting: Family Medicine

## 2017-03-08 MED ORDER — METHYLPREDNISOLONE 4 MG PO TABS: ORAL_TABLET | ORAL | 0 refills | Status: DC

## 2017-03-09 MED ORDER — MOMETASONE FUROATE 220 MCG/INH IN AEPB: 1.0000 | INHALATION_SPRAY | Freq: Every day | RESPIRATORY_TRACT | 1 refills | Status: DC

## 2017-03-09 NOTE — Telephone Encounter (Signed)
I reviewed her formulary. Asmanex is a tier one med. prscribe her the twist haler. 1 puff po daily, dis #1 with 1 rf to use til see by pulmonology. Dr Jacinto Reap

## 2017-03-10 ENCOUNTER — Encounter: Payer: Self-pay | Admitting: Family Medicine

## 2017-03-24 ENCOUNTER — Encounter: Payer: Self-pay | Admitting: Pulmonary Disease

## 2017-03-24 ENCOUNTER — Ambulatory Visit (HOSPITAL_BASED_OUTPATIENT_CLINIC_OR_DEPARTMENT_OTHER)
Admission: RE | Admit: 2017-03-24 | Discharge: 2017-03-24 | Disposition: A | Discharge status: Home / Self Care | Payer: Commercial Managed Care - PPO | Source: Ambulatory Visit | Attending: Pulmonary Disease | Admitting: Pulmonary Disease

## 2017-03-24 ENCOUNTER — Ambulatory Visit (INDEPENDENT_AMBULATORY_CARE_PROVIDER_SITE_OTHER): Payer: Commercial Managed Care - PPO | Admitting: Pulmonary Disease

## 2017-03-24 VITALS — BP 123/83 | HR 88 | Ht 66.0 in | Wt 216.0 lb

## 2017-03-24 DIAGNOSIS — J45901 Unspecified asthma with (acute) exacerbation: Secondary | ICD-10-CM | POA: Insufficient documentation

## 2017-03-24 DIAGNOSIS — J4550 Severe persistent asthma, uncomplicated: Secondary | ICD-10-CM | POA: Diagnosis not present

## 2017-03-24 DIAGNOSIS — R0602 Shortness of breath: Secondary | ICD-10-CM | POA: Diagnosis not present

## 2017-03-24 MED ORDER — ALBUTEROL SULFATE HFA 108 (90 BASE) MCG/ACT IN AERS: 2.0000 | INHALATION_SPRAY | Freq: Four times a day (QID) | RESPIRATORY_TRACT | 3 refills | Status: DC | PRN

## 2017-03-24 MED ORDER — PREDNISONE 10 MG PO TABS: 10.0000 mg | ORAL_TABLET | ORAL | 0 refills | Status: AC

## 2017-03-24 MED ORDER — FLUTICASONE-SALMETEROL 232-14 MCG/ACT IN AEPB: 1.0000 | INHALATION_SPRAY | Freq: Two times a day (BID) | RESPIRATORY_TRACT | 6 refills | Status: DC

## 2017-03-24 NOTE — Progress Notes (Signed)
Subjective:    Patient ID: Meagan Chung, female    DOB: [birth year], 32 y.o.   MRN: [id number]  Synopsis: Former patient of Dr. Elsworth Soho with Asthma  HPI Chief Complaint  Patient presents with  . pulm consult    Pt referred by Dr. Willette Alma MD for asthma. Since April 2018 pt has had wheezing, productive cough-green, with SOB at rest and with exertion. Pt has chest tightness with upper back pain   Autumm was referred back to [place] for recurrent exacerbations of asthma.  She says taht she has been on prednisone for nearly 8 months now (since April 2018).  She has had recurrent exacerbations since then and states that she has not been able to come off of prednisone for several months.  She tells me that she had asthma diagnosed at age 32.  She says that she has been hospitalized a number of times over the years for her asthma.  The most recent was in 2017 when she had an episode of pneumonia and was in the intensive care unit.  She says that in the past she was told that she had allergy to cats and other environmental allergies and immunotherapy was attempted while she was a teenager.  However, she says that she did not take the medicine consistently because she played sports and frequently would miss shots.  So she says she is not clear if immunotherapy was ever effective.  She has been seen by pulmonary in the past, she saw Dr. Soledad Gerlach at [facility] previously.  She is also been followed by my partner once here.  It seems as if she has struggled with the ability to take controller medicines because of insurance coverage.  She says that when she has been on Advair in the past she has had persistent symptoms of asthma and recurrent exacerbations so it is not clear if that was ever very helpful.  However, in the last year her symptoms have dramatically worsened and she says that "anything" will cause her to have worsening asthma symptoms.  Specifically, she mentions cold weather, upper respiratory  infections, or changes in the environmental pollens.  She says that she has never been treated with Xolair in the past.  She has been referred to both our clinic as well as allergy for further evaluation.  Currently she is taking prednisone 20 mg daily and says that she has not been able to go any lower than this without having worsening symptoms since October 2018.  She has a 33-year-old daughter.  She says that when she was pregnant she did not have worsening symptoms and in fact her asthma improved significantly.  She is never smoked cigarettes.  She works in Actor in a school.  She does not work directly with the children.  She works in a Theatre manager with 2 other adults.  She says that she does not have acid reflux but she takes Protonix anyway because everyone's always been concerned about acid reflux making her asthma worse.  She denies allergic rhinitis symptoms or eczema.  Past Medical History:  Diagnosis Date  . Anxiety and depression    anxiety, bulemia in past all well controlled   . Asthma   . Depression    anxiety, bulemia in past all well controlled  . Diarrhea 11/29/2014  . Hyperlipidemia, mixed 02/02/2015  . Thyroid nodule      Family History  Problem Relation Age of Onset  . Stroke Father  2  . Hyperlipidemia Father   . Anorexia nervosa Sister        56  . Kidney Stones Sister   . Thyroid disease Brother   . Obesity Brother   . Dementia Maternal Grandmother   . Osteoporosis Maternal Grandmother   . Cancer Maternal Grandmother        breast  . Cancer Maternal Grandfather        prostate  . Alcohol abuse Maternal Grandfather   . Cancer Paternal Grandmother 39       3 masses in pancreas and thyroid, suspect cancer  . Hyperlipidemia Paternal Grandmother      Social History   Socioeconomic History  . Marital status: Married    Spouse name: Not on file  . Number of children: 0  . Years of education: Not on file  . Highest education  level: Not on file  Social Needs  . Financial resource strain: Not on file  . Food insecurity - worry: Not on file  . Food insecurity - inability: Not on file  . Transportation needs - medical: Not on file  . Transportation needs - non-medical: Not on file  Occupational History  . Occupation: development  Tobacco Use  . Smoking status: Never Smoker  . Smokeless tobacco: Never Used  Substance and Sexual Activity  . Alcohol use: Yes    Alcohol/week: 0.0 oz    Comment: rarely  . Drug use: No  . 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 Not on file  Social History Narrative  . Not on file     Allergies  Allergen Reactions  . Other Hives    Capers causes SOB Cats 4+     Outpatient Medications Prior to Visit  Medication Sig Dispense Refill  . albuterol (PROVENTIL) (2.5 MG/3ML) 0.083% nebulizer solution Take 3 mLs (2.5 mg total) by nebulization every 6 (six) hours as needed for wheezing or shortness of breath. 150 mL 1  . montelukast (SINGULAIR) 10 MG tablet Take 1 tablet (10 mg total) by mouth at bedtime as needed. 30 tablet 3  . Norgestimate-Ethinyl Estradiol Triphasic (ORTHO TRI-CYCLEN LO) 0.18/0.215/0.25 MG-25 MCG tab Take 1 tablet by mouth daily.    . pantoprazole (PROTONIX) 40 MG tablet Take 1 tablet (40 mg total) by mouth daily before breakfast. 30 tablet 4  . predniSONE (DELTASONE) 10 MG tablet 4 tablets x 2 days, 3 tabs x 2 days, 2 tabs x 2 days, 1 tab x 2 days (Patient taking differently: 20 mg. 4 tablets x 2 days, 3 tabs x 2 days, 2 tabs x 2 days, 1 tab x 2 days) 20 tablet 0  . valACYclovir (VALTREX) 500 MG tablet TAKE 1 TABLET BY MOUTH TWICE DAILY FOR 3 DAYS THEN TAKE 1 TABLET BY MOUTH DAILY 35 tablet 0  . albuterol (VENTOLIN HFA) 108 (90 Base) MCG/ACT inhaler Inhale 2 puffs into the lungs every 6 (six) hours as needed for wheezing or shortness of breath. 1 Inhaler 3  .  budesonide-formoterol (SYMBICORT) 160-4.5 MCG/ACT inhaler Inhale 2 puffs into the lungs 2 (two) times daily. (Patient not taking: Reported on 03/24/2017) 1 Inhaler 3  . methylPREDNISolone (MEDROL) 4 MG tablet 5 tab po qd X 1d then 4 tab po qd X 1d then 3 tab po qd X 1d then 2 tab po qd then 1 tab po qd (Patient not taking: Reported on 03/24/2017) 15  tablet 0  . mometasone (ASMANEX 60 METERED DOSES) 220 MCG/INH inhaler Inhale 1 puff into the lungs daily. (Patient not taking: Reported on 03/24/2017) 1 Inhaler 1   No facility-administered medications prior to visit.       Review of Systems  Constitutional: Negative for fever and unexpected weight change.  HENT: Positive for congestion. Negative for dental problem, ear pain, nosebleeds, postnasal drip, rhinorrhea, sinus pressure, sneezing, sore throat and trouble swallowing.   Eyes: Negative for redness and itching.  Respiratory: Positive for cough, chest tightness, shortness of breath and wheezing.   Cardiovascular: Negative for palpitations and leg swelling.  Gastrointestinal: Negative for nausea and vomiting.  Genitourinary: Negative for dysuria.  Musculoskeletal: Negative for joint swelling.  Skin: Negative for rash.  Allergic/Immunologic: Positive for environmental allergies. Negative for food allergies and immunocompromised state.  Neurological: Positive for headaches.  Hematological: Does not bruise/bleed easily.  Psychiatric/Behavioral: Negative for dysphoric mood. The patient is nervous/anxious.        Objective:   Physical Exam  Vitals:   03/24/17 1609  BP: 123/83  Pulse: 88  SpO2: 99%  Weight: 216 lb (98 kg)  Height: 5\' 6"  (1.676 m)   Gen: well appearing HENT: OP clear, TM's clear, neck supple PULM: CTA B, normal percussion CV: RRR, no mgr, trace edema GI: BS+, soft, nontender Derm: no cyanosis or rash Psyche: normal mood and affect   CBC    Component Value Date/Time   WBC 6.7 06/14/2016 0737   RBC 4.61  06/14/2016 0737   HGB 13.3 06/14/2016 0737   HCT 40.2 06/14/2016 0737   PLT 280.0 06/14/2016 0737   MCV 87.3 06/14/2016 0737   MCH 28.9 02/01/2016 0605   MCHC 33.0 06/14/2016 0737   RDW 13.6 06/14/2016 0737   LYMPHSABS 2.5 11/09/2014 2046   MONOABS 0.6 11/09/2014 2046   EOSABS 1.0 (H) 11/09/2014 2046   BASOSABS 0.1 11/09/2014 2046   BMET    Component Value Date/Time   NA 139 01/31/2015 0813   K 4.4 01/31/2015 0813   CL 106 01/31/2015 0813   CO2 26 01/31/2015 0813   GLUCOSE 80 01/31/2015 0813   BUN 13 01/31/2015 0813   CREATININE 0.79 01/31/2015 0813   CALCIUM 9.4 01/31/2015 0813   GFRNONAA >60 11/11/2014 0225   GFRAA >60 11/11/2014 0225    Records from her visit with her primary care physician in November reviewed where she was changed to Symbicort for cost reasons. She had another exacerbation in December 2018  Records from her visit with [facility] in 2014 reviewed where she was treated for severe persistent asthma with Combivent 3 times daily, Dulera 2 times daily, and Kenalog injections.  She was also noted to have allergic rhinitis and it was recommended that she continue cetirizine.  In 2015 it was mentioned that she should be considered for clinical trials for IL-5 medication.  Around that time no more office visits were noted. She has been treated for depression and anxiety in the past  Pulmonary function testing: 2014 from [facility]: pre bronchodilator FEV1 110% predicted, ratio 78%, 30% change in FEF 25-75.     Assessment & Plan:   Persistent asthma with acute exacerbation, unspecified asthma severity - Plan: DG Chest 2 View  Severe persistent asthma, unspecified whether complicated  Discussion: Yukiko has a history of severe persistent asthma and has been followed by [facility] asthma specialist in the past.  She is now having recurrent exacerbations of severe persistent asthma and unfortunately has  been prednisone dependent for several months.  The first  step is going to be to restart controller medicine.  I would love to test her blood for serum IgE and eosinophil counts but on her current dose of prednisone I think that is not going to be very helpful.  She is likely going to need either Xolair or an anti-IL-5 medication to help treat this condition.  I would prefer to get her started on a controller medicine to see how her symptoms improve and have her taper down on prednisone prior to testing IgE and eosinophil counts.  I agree completely with her seeing the allergist.  I would prefer for her to be treated with immunotherapy if we could find an allergen to target.  However, considering her history I think she may need to be treated with something like Xolair or an anti-IL-5 medicine.  Plan: Severe persistent asthma: Start taking Airduo 1 puff twice a day In 2 weeks reduce prednisone to 10 mg daily Return in 3 weeks for blood testing: CBC with differential, serum IgE (these are test to evaluate for allergy) Keep your appointment with the allergist We will refill albuterol  We will see you back in 3 weeks with either myself or a nurse practitioner   Current Outpatient Medications:  .  albuterol (PROVENTIL) (2.5 MG/3ML) 0.083% nebulizer solution, Take 3 mLs (2.5 mg total) by nebulization every 6 (six) hours as needed for wheezing or shortness of breath., Disp: 150 mL, Rfl: 1 .  albuterol (VENTOLIN HFA) 108 (90 Base) MCG/ACT inhaler, Inhale 2 puffs into the lungs every 6 (six) hours as needed for wheezing or shortness of breath., Disp: 1 Inhaler, Rfl: 3 .  montelukast (SINGULAIR) 10 MG tablet, Take 1 tablet (10 mg total) by mouth at bedtime as needed., Disp: 30 tablet, Rfl: 3 .  Norgestimate-Ethinyl Estradiol Triphasic (ORTHO TRI-CYCLEN LO) 0.18/0.215/0.25 MG-25 MCG tab, Take 1 tablet by mouth daily., Disp: , Rfl:  .  pantoprazole (PROTONIX) 40 MG tablet, Take 1 tablet (40 mg total) by mouth daily before breakfast., Disp: 30 tablet, Rfl: 4 .   predniSONE (DELTASONE) 10 MG tablet, 4 tablets x 2 days, 3 tabs x 2 days, 2 tabs x 2 days, 1 tab x 2 days (Patient taking differently: 20 mg. 4 tablets x 2 days, 3 tabs x 2 days, 2 tabs x 2 days, 1 tab x 2 days), Disp: 20 tablet, Rfl: 0 .  valACYclovir (VALTREX) 500 MG tablet, TAKE 1 TABLET BY MOUTH TWICE DAILY FOR 3 DAYS THEN TAKE 1 TABLET BY MOUTH DAILY, Disp: 35 tablet, Rfl: 0 .  budesonide-formoterol (SYMBICORT) 160-4.5 MCG/ACT inhaler, Inhale 2 puffs into the lungs 2 (two) times daily. (Patient not taking: Reported on 03/24/2017), Disp: 1 Inhaler, Rfl: 3 .  Fluticasone-Salmeterol (AIRDUO RESPICLICK 202/54) 270-62 MCG/ACT AEPB, Inhale 1 puff into the lungs 2 (two) times daily., Disp: 1 each, Rfl: 6 .  methylPREDNISolone (MEDROL) 4 MG tablet, 5 tab po qd X 1d then 4 tab po qd X 1d then 3 tab po qd X 1d then 2 tab po qd then 1 tab po qd (Patient not taking: Reported on 03/24/2017), Disp: 15 tablet, Rfl: 0 .  mometasone (ASMANEX 60 METERED DOSES) 220 MCG/INH inhaler, Inhale 1 puff into the lungs daily. (Patient not taking: Reported on 03/24/2017), Disp: 1 Inhaler, Rfl: 1 .  predniSONE (DELTASONE) 10 MG tablet, Take 1 tablet (10 mg total) by mouth 1 day or 1 dose for 1 dose., Disp: 30 tablet, Rfl:  0   

## 2017-03-24 NOTE — Patient Instructions (Signed)
Severe persistent asthma: Start taking Airduo 1 puff twice a day In 2 weeks reduce prednisone to 10 mg daily Return in 3 weeks for blood testing: CBC with differential, serum IgE (these are test to evaluate for allergy) Keep your appointment with the allergist We will refill albuterol  We will see you back in 3 weeks with either myself or a nurse practitioner

## 2017-03-30 ENCOUNTER — Telehealth: Payer: Self-pay | Admitting: Pulmonary Disease

## 2017-03-30 NOTE — Telephone Encounter (Signed)
LMOM TCB x1 Would like to verify information in message Also, did pt try just mainstream pharmacy chains or any specialty pharmacies? How long did the pharmacies say it would take for the AirDuo to come in on backorder?

## 2017-03-31 NOTE — Telephone Encounter (Signed)
Pt is returning call, Cb is [phone].

## 2017-03-31 NOTE — Telephone Encounter (Signed)
Spoke with pt, she states she called [facility] and [facility] and no luck with Rx in stock. I called [facility] and they don't have it either. BQ would you like to change to another inhaler. We have zero copay cards for Symbicort and pt stated she has used this in the past. I will keep card if you decide to switch and then have pt come by and pick it up. BQ please advise.

## 2017-03-31 NOTE — Telephone Encounter (Signed)
No one has AirDuo Respiclick? Dose 232/14 1 puff bid.  Someone has it

## 2017-04-04 MED ORDER — FLUTICASONE-SALMETEROL 232-14 MCG/ACT IN AEPB: 1.0000 | INHALATION_SPRAY | Freq: Two times a day (BID) | RESPIRATORY_TRACT | 6 refills | Status: DC

## 2017-04-04 NOTE — Telephone Encounter (Signed)
Called [facility] in [place]. They do not have the medication. [facility] [address] stated they did not have the medication on hand, but could order it and it would be in the store as soon as tomorrow.   Spoke with patient to determine [facility], [facility] yes. [facility] [address] [facility]. RX has been sent in. Nothing else needed at time of call.

## 2017-04-08 ENCOUNTER — Ambulatory Visit (INDEPENDENT_AMBULATORY_CARE_PROVIDER_SITE_OTHER): Payer: Commercial Managed Care - PPO | Admitting: Allergy & Immunology

## 2017-04-08 ENCOUNTER — Encounter: Payer: Self-pay | Admitting: Allergy & Immunology

## 2017-04-08 ENCOUNTER — Ambulatory Visit (INDEPENDENT_AMBULATORY_CARE_PROVIDER_SITE_OTHER): Payer: Commercial Managed Care - PPO

## 2017-04-08 VITALS — Ht 66.5 in

## 2017-04-08 DIAGNOSIS — L508 Other urticaria: Secondary | ICD-10-CM

## 2017-04-08 DIAGNOSIS — B999 Unspecified infectious disease: Secondary | ICD-10-CM | POA: Diagnosis not present

## 2017-04-08 DIAGNOSIS — J4550 Severe persistent asthma, uncomplicated: Secondary | ICD-10-CM

## 2017-04-08 DIAGNOSIS — J3089 Other allergic rhinitis: Secondary | ICD-10-CM | POA: Insufficient documentation

## 2017-04-08 DIAGNOSIS — J302 Other seasonal allergic rhinitis: Secondary | ICD-10-CM

## 2017-04-08 MED ORDER — FLUTICASONE-SALMETEROL 500-50 MCG/DOSE IN AEPB: 1.0000 | INHALATION_SPRAY | Freq: Two times a day (BID) | RESPIRATORY_TRACT | 5 refills | Status: DC

## 2017-04-08 MED ORDER — BENRALIZUMAB 30 MG/ML ~~LOC~~ SOSY
30.0000 mg | PREFILLED_SYRINGE | Freq: Once | SUBCUTANEOUS | Status: AC
  Administered 2017-04-08 – 2017-05-06 (×2): 30 mg via SUBCUTANEOUS

## 2017-04-08 NOTE — Progress Notes (Signed)
NEW PATIENT  Date of Service/Encounter:  04/08/17  Referring provider: Mosie Lukes, MD   Assessment:   Severe persistent asthma - prednisone dependent with initiation of Fasenra today  Recurrent infections  Chronic urticaria - improved since delivery of her baby  Seasonal and perennial allergic rhinitis (trees, weeds, grasses, indoor molds, dust mites, cat and dog)    Asthma Reportables:  Severity: severe persistent  Risk: high Control: very poorly controlled    Plan/Recommendations:   1. Severe persistent asthma without complication - Lung function looked great today, but this was likely because you have been on prednisone and you recently used your albuterol inhaler.  - Given the severity of your symptoms, I would like to start you on an anti-IL5 medication (sample of Berna Bue given today). - We will get some lab work to rule out serious causes of asthma: CBC with differential (to look at a current eosinophil level), IgE level (in case we need to change to Xolair in the future), Alpha-1-Antitrypsin (to rule of alpha 1 antitrypsin deficiency), Aspergillus preicipitins (to rule out allergic bronchopulmonary aspergillosis), and Hypersensitivity Pneumonitis panel (to rule out hypersensitivity pneumonitis). - In the meantime, we will continue you on Advair 500/50 one puff twice daily.  - Daily controller medication(s): Fasenra monthly x 3 doses (then every 8 weeks) and Advair 500/61mcg one puff twice daily - Prior to physical activity: ProAir 2 puffs 10-15 minutes before physical activity. - Rescue medications: ProAir 4 puffs every 4-6 hours as needed - Asthma control goals:  * Full participation in all desired activities (may need albuterol before activity) * Albuterol use two time or less a week on average (not counting use with activity) * Cough interfering with sleep two time or less a month * Oral steroids no more than once a year * No hospitalizations  2.  Recurrent infections - We will get some labs to screen for immune deficiencies (i.e. problems with fighting infections) - We will call you in 1-2 weeks with the results.  3. Perennial and seasonal allergic rhinitis - Testing today showed: trees, weeds, grasses, indoor molds, dust mites, cat and dog - Avoidance measures provided. - Start taking: Zyrtec (cetirizine) 10mg  tablet once daily as needed - You can use an extra dose of the antihistamine, if needed, for breakthrough symptoms.  - Consider nasal saline rinses 1-2 times daily to remove allergens from the nasal cavities as well as help with mucous clearance (this is especially helpful to do before the nasal sprays are given) - Consider allergy shots as a means of long-term control. - Allergy shots "re-train" and "reset" the immune system to ignore environmental allergens and decrease the resulting immune response to those allergens (sneezing, itchy watery eyes, runny nose, nasal congestion, etc).    - Allergy shots improve symptoms in 75-85% of patients.  - Call us when you make a decision about allergy shots (these can help with asthma as well)  4. Chronic urticaria - now resolved after the pregnancy - We will get some labs to rule out serious causes of hives: serum tryptase to rule out mast cell disease and thyroid antibodies to rule out thyroid disease  5. Return in about 4 weeks (around 05/06/2017).  Subjective:   Meagan Chung is a 32 y.o. female presenting today for evaluation of  Chief Complaint  Patient presents with  . Asthma    Meagan Chung has a history of the following: Patient Active Problem List   Diagnosis Date Noted  .  Chronic asthma without complication 38/25/0539  . Anxiety and depression   . Thyroid nodule   . Esophageal reflux   . Hypokalemia   . Asthma 11/10/2014  . Irritable bowel syndrome 12/19/2012  . Insomnia 12/19/2012  . History of bulimia 12/19/2012  . 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  Hashimoto's thyroiditis 01/06/2011  . Depression 01/06/2011    History obtained from: chart review and patient.  Meagan Chung was referred by Mosie Lukes, MD.      Meagan Chung is a 32 y.o. female presenting for an allergy and asthma evaluation. She has a history of very severe asthma with prednisone dependence.   Asthma/Respiratory Symptom History: She diagnosed with asthma when she was 32 years old. She has been hospitalized in the past for her asthma. She was hospitalized last in 2016. She is supposed to be on Advair 500/50 one puff twice daily, but they are still looking. She has been on Symbicort in the past; she is unsure whether it has worked in the past. At this point, she has been on prednisone on and off for the past 14 months. She actually did well during her pregnancy; she now has a 12 month old daughter. She is currently on 10mg  prednisone, weaned two days. Her PCP has been managing this for the most part. She is followed by Dr. Sherrin Daisy, first seen two weeks ago.   She did have an absolute eosinophil count of 1000 in September 2016, but otherwise her eosinophil counts have been minimal. It should be noted that she is on prednisone more often than not over 12-18 months.   Allergic Rhinitis Symptom History: She does have problems with rhinorrhea around cats, but she seems to tolerate the dogs. She has no allergy symptoms at this point, but she has been on prednisone so often that it is difficult to tell. She has sinus infections in the spring and the fall with bad allergies. She does not use nose sprays or antihistamines at this point. She has been tested for seasonal allergies in the past, last time around five years ago.   Food Allergy Symptom History: At one point, she did have problems with food allergies. She does have problems with hives with chicken and bananas, although she eats those now without a problems. She does not eat a lot of nuts, but she eats a lot of eggs. She does eat wheat  and milk without a problem. She thinks that this was at least five years ago when this was tested, but her reactions to foods have not been a problem for several years.   She does have a history of recurrent infections, typically sinusitis. She is on antibiotics every other month, according to the patient. She has never been hospitalized for an infection and has not needed an IV antibiotic. Vaccinations are up to date.   Otherwise, there is no history of other atopic diseases, including drug allergies, stinging insect allergies, or urticaria.    Past Medical History: Patient Active Problem List   Diagnosis Date Noted  . Chronic asthma without complication 76/73/4193  . Anxiety and depression   . Thyroid nodule   . Esophageal reflux   . Hypokalemia   . Asthma 11/10/2014  . Irritable bowel syndrome 12/19/2012  . Insomnia 12/19/2012  . History of bulimia 12/19/2012  . 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 Hashimoto's thyroiditis 01/06/2011  . Depression 01/06/2011    Medication List:  Allergies as of 04/08/2017  Reactions   Other Cough   Capers causes SOB Cats 4+      Medication List        Accurate as of 04/08/17 11:36 PM. Always use your most recent med list.          albuterol (2.5 MG/3ML) 0.083% nebulizer solution Commonly known as:  PROVENTIL Take 3 mLs (2.5 mg total) by nebulization every 6 (six) hours as needed for wheezing or shortness of breath.   albuterol 108 (90 Base) MCG/ACT inhaler Commonly known as:  VENTOLIN HFA Inhale 2 puffs into the lungs every 6 (six) hours as needed for wheezing or shortness of breath.   budesonide-formoterol 160-4.5 MCG/ACT inhaler Commonly known as:  SYMBICORT Inhale 2 puffs into the lungs 2 (two) times daily.   Fluticasone-Salmeterol 232-14 MCG/ACT Aepb Commonly known as:  AIRDUO RESPICLICK 191/47 Inhale 1 puff into the lungs 2 (two) times daily.   Fluticasone-Salmeterol 500-50 MCG/DOSE Aepb Commonly known as:   ADVAIR Inhale 1 puff into the lungs 2 (two) times daily.   methylPREDNISolone 4 MG tablet Commonly known as:  MEDROL 5 tab po qd X 1d then 4 tab po qd X 1d then 3 tab po qd X 1d then 2 tab po qd then 1 tab po qd   mometasone 220 MCG/INH inhaler Commonly known as:  ASMANEX 60 METERED DOSES Inhale 1 puff into the lungs daily.   montelukast 10 MG tablet Commonly known as:  SINGULAIR Take 1 tablet (10 mg total) by mouth at bedtime as needed.   ORTHO TRI-CYCLEN LO 0.18/0.215/0.25 MG-25 MCG tab Generic drug:  Norgestimate-Ethinyl Estradiol Triphasic Take 1 tablet by mouth daily.   pantoprazole 40 MG tablet Commonly known as:  PROTONIX Take 1 tablet (40 mg total) by mouth daily before breakfast.   predniSONE 10 MG tablet Commonly known as:  DELTASONE 4 tablets x 2 days, 3 tabs x 2 days, 2 tabs x 2 days, 1 tab x 2 days   sertraline 100 MG tablet Commonly known as:  ZOLOFT TK 1 T PO D   valACYclovir 500 MG tablet Commonly known as:  VALTREX TAKE 1 TABLET BY MOUTH TWICE DAILY FOR 3 DAYS THEN TAKE 1 TABLET BY MOUTH DAILY       Birth History: non-contributory.  Developmental History: non-contributory.   Past Surgical History: Past Surgical History:  Procedure Laterality Date  . ANKLE SURGERY    . WISDOM TOOTH EXTRACTION Bilateral      Family History: Family History  Problem Relation Age of Onset  . Stroke Father        2  . Hyperlipidemia Father   . Allergic rhinitis Father   . Anorexia nervosa Sister        92  . Kidney Stones Sister   . Thyroid disease Brother   . Obesity Brother   . Asthma Brother   . Dementia Maternal Grandmother   . Osteoporosis Maternal Grandmother   . Cancer Maternal Grandmother        breast  . Cancer Maternal Grandfather        prostate  . Alcohol abuse Maternal Grandfather   . Cancer Paternal Grandmother 71       3 masses in pancreas and thyroid, suspect cancer  . Hyperlipidemia Paternal Grandmother   . Eczema Mother   .  Urticaria Mother   . Immunodeficiency Neg Hx   . Angioedema Neg Hx      Social History: Meagan Chung lives at home with her husband and hew daughter.  Her parents also recently moved in with them as well. They live in a house that was built in 1932. There is carpeting in the main living areas and hardwoods in the bedroom. There is gas heating and central cooling. There are three dogs in the home. There are dust mite coverings in the bedding, and the dogs do stay with Jamela and her husband in their bed. There is no tobacco exposure. 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 Her husband works as at his family business and is a Art therapist.     Review of Systems: a 14-point review of systems is pertinent for what is mentioned in HPI.  Otherwise, all other systems were negative. Constitutional: negative other than that listed in the HPI Eyes: negative other than that listed in the HPI Ears, nose, mouth, throat, and face: negative other than that listed in the HPI Respiratory: negative other than that listed in the HPI Cardiovascular: negative other than that listed in the HPI Gastrointestinal: negative other than that listed in the HPI Genitourinary: negative other than that listed in the HPI Integument: negative other than that listed in the HPI Hematologic: negative other than that listed in the HPI Musculoskeletal: negative other than that listed in the HPI Neurological: negative other than that listed in the HPI Allergy/Immunologic: negative other than that listed in the HPI    Objective:   Height 5' 6.5" (1.689 m), not currently breastfeeding. Body mass index is 34.34 kg/m.   Physical Exam:  General: Alert, interactive, in no acute distress. Cushingoid appearance with a moon hump on her posterior neck.  Eyes: No conjunctival injection bilaterally, no discharge on the right, no discharge on  the left and no Horner-Trantas dots present. PERRL bilaterally. EOMI without pain. No photophobia.  Ears: Right TM pearly gray with normal light reflex, Left TM pearly gray with normal light reflex, Right TM intact without perforation and Left TM intact without perforation.  Nose/Throat: External nose within normal limits, nasal crease present and septum midline. Turbinates markedly edematous with clear discharge. Posterior oropharynx erythematous with cobblestoning in the posterior oropharynx. Tonsils 2+ without exudates.  Tongue without thrush. Neck: Supple without thyromegaly. Trachea midline. Adenopathy: no enlarged lymph nodes appreciated in the anterior cervical, occipital, axillary, epitrochlear, inguinal, or popliteal regions. Lungs: Decreased breath sounds bilaterally without wheezing, rhonchi or rales. No increased work of breathing. CV: Normal S1/S2. No murmurs. Capillary refill <2 seconds.  Abdomen: Nondistended, nontender. No guarding or rebound tenderness. Bowel sounds faint and present in all fields  Skin: Warm and dry, without lesions or rashes. Extremities:  No clubbing, cyanosis or edema. Neuro:   Grossly intact. No focal deficits appreciated. Responsive to questions.  Diagnostic studies:   Spirometry: results normal (FEV1: 3.65/109%, FVC: 4.49/111%, FEV1/FVC: 81%).    Spirometry consistent with normal pattern. .  Allergy Studies:   Indoor/Outdoor Percutaneous Adult Environmental Panel: positive to timothy grass, birch, oak and cat. Otherwise negative with adequate controls.  Indoor/Outdoor Selected Intradermal Environmental Panel: positive to mold mix #4, cat and mite mix. Otherwise negative with adequate controls.   Allergy testing results were read and interpreted by myself, documented by clinical staff.     Salvatore Marvel, MD [facility]

## 2017-04-08 NOTE — Patient Instructions (Addendum)
1. Severe persistent asthma without complication - Lung function looked great today, but this was likely because you have been on prednisone and you recently used your albuterol inhaler.  - Given the severity of your symptoms, I would like to start you on an anti-IL5 medication (sample of Fasenra given today). - We will get some lab work to rule out serious causes of asthma: CBC with differential (to look at a current eosinophil level), IgE level (in case we need to change to Xolair in the future), Alpha-1-Antitrypsin (to rule of alpha 1 antitrypsin deficiency), Aspergillus preicipitins (to rule out allergic bronchopulmonary aspergillosis), and Hypersensitivity Pneumonitis panel (to rule out hypersensitivity pneumonitis). - In the meantime, we will continue you on Advair 500/50 one puff twice daily.  - Daily controller medication(s): Fasenra monthly x 3 doses (then every 8 weeks thereafter), Singulair 10mg  daily and Advair 500/74mcg one puff twice daily - Prior to physical activity: ProAir 2 puffs 10-15 minutes before physical activity. - Rescue medications: ProAir 4 puffs every 4-6 hours as needed - Asthma control goals:  * Full participation in all desired activities (may need albuterol before activity) * Albuterol use two time or less a week on average (not counting use with activity) * Cough interfering with sleep two time or less a month * Oral steroids no more than once a year * No hospitalizations  2. Recurrent infections - We will get some labs to screen for immune deficiencies (i.e. problems with fighting infections) - We will call you in 1-2 weeks with the results.  3. Perennial and seasonal allergic rhinitis - Testing today showed: trees, weeds, grasses, indoor molds, dust mites, cat and dog - Avoidance measures provided. - Start taking: Zyrtec (cetirizine) 10mg  tablet once daily as needed - You can use an extra dose of the antihistamine, if needed, for breakthrough symptoms.  -  Consider nasal saline rinses 1-2 times daily to remove allergens from the nasal cavities as well as help with mucous clearance (this is especially helpful to do before the nasal sprays are given) - Consider allergy shots as a means of long-term control. - Allergy shots "re-train" and "reset" the immune system to ignore environmental allergens and decrease the resulting immune response to those allergens (sneezing, itchy watery eyes, runny nose, nasal congestion, etc).    - Allergy shots improve symptoms in 75-85% of patients.  - Call us when you make a decision about allergy shots (these can help with asthma as well)  4. Chronic urticaria - now resolved after the pregnancy - We will get some labs to rule out serious causes of hives: serum tryptase to rule out mast cell disease and thyroid antibodies to rule out thyroid disease  5. Return in about 4 weeks (around 05/06/2017).   Please inform us of any Emergency Department visits, hospitalizations, or changes in symptoms. Call us before going to the ED for breathing or allergy symptoms since we might be able to fit you in for a sick visit. Feel free to contact us anytime with any questions, problems, or concerns.  It was a pleasure to meet you today! Happy Valentine's Day!   Websites that have reliable patient information: 1. American Academy of Asthma, Allergy, and Immunology: www.aaaai.org 2. Food Allergy Research and Education (FARE): foodallergy.org 3. Mothers of Asthmatics: http://www.asthmacommunitynetwork.org 4. American College of Allergy, Asthma, and Immunology: www.acaai.org  Reducing Pollen Exposure  The American Academy of Allergy, Asthma and Immunology suggests the following steps to reduce your exposure to pollen during allergy  seasons.    1. Do not hang sheets or clothing out to dry; pollen may collect on these items. 2. Do not mow lawns or spend time around freshly cut grass; mowing stirs up pollen. 3. Keep windows closed at  night.  Keep car windows closed while driving. 4. Minimize morning activities outdoors, a time when pollen counts are usually at their highest. 5. Stay indoors as much as possible when pollen counts or humidity is high and on windy days when pollen tends to remain in the air longer. 6. Use air conditioning when possible.  Many air conditioners have filters that trap the pollen spores. 7. Use a HEPA room air filter to remove pollen form the indoor air you breathe.  Control of Mold Allergen   Mold and fungi can grow on a variety of surfaces provided certain temperature and moisture conditions exist.  Outdoor molds grow on plants, decaying vegetation and soil.  The major outdoor mold, Alternaria and Cladosporium, are found in very high numbers during hot and dry conditions.  Generally, a late Summer - Fall peak is seen for common outdoor fungal spores.  Rain will temporarily lower outdoor mold spore count, but counts rise rapidly when the rainy period ends.  The most important indoor molds are Aspergillus and Penicillium.  Dark, humid and poorly ventilated basements are ideal sites for mold growth.  The next most common sites of mold growth are the bathroom and the kitchen.   Indoor (Perennial) Mold Control   Positive indoor molds via skin testing: Fusarium, Aureobasidium (Pullulara) and Rhizopus  1. Maintain humidity below 50%. 2. Clean washable surfaces with 5% bleach solution. 3. Remove sources e.g. contaminated carpets.    Control of House Dust Mite Allergen    House dust mites play a major role in allergic asthma and rhinitis.  They occur in environments with high humidity wherever human skin, the food for dust mites is found. High levels have been detected in dust obtained from mattresses, pillows, carpets, upholstered furniture, bed covers, clothes and soft toys.  The principal allergen of the house dust mite is found in its feces.  A gram of dust may contain 1,000 mites and 250,000  fecal particles.  Mite antigen is easily measured in the air during house cleaning activities.    1. Encase mattresses, including the box spring, and pillow, in an air tight cover.  Seal the zipper end of the encased mattresses with wide adhesive tape. 2. Wash the bedding in water of 130 degrees Farenheit weekly.  Avoid cotton comforters/quilts and flannel bedding: the most ideal bed covering is the dacron comforter. 3. Remove all upholstered furniture from the bedroom. 4. Remove carpets, carpet padding, rugs, and non-washable window drapes from the bedroom.  Wash drapes weekly or use plastic window coverings. 5. Remove all non-washable stuffed toys from the bedroom.  Wash stuffed toys weekly. 6. Have the room cleaned frequently with a vacuum cleaner and a damp dust-mop.  The patient should not be in a room which is being cleaned and should wait 1 hour after cleaning before going into the room. 7. Close and seal all heating outlets in the bedroom.  Otherwise, the room will become filled with dust-laden air.  An electric heater can be used to heat the room. 8. Reduce indoor humidity to less than 50%.  Do not use a humidifier.  Control of Dog or Cat Allergen  Avoidance is the best way to manage a dog or cat allergy. If you have a  dog or cat and are allergic to dog or cats, consider removing the dog or cat from the home. If you have a dog or cat but don't want to find it a new home, or if your family wants a pet even though someone in the household is allergic, here are some strategies that may help keep symptoms at bay:  1. Keep the pet out of your bedroom and restrict it to only a few rooms. Be advised that keeping the dog or cat in only one room will not limit the allergens to that room. 2. Don't pet, hug or kiss the dog or cat; if you do, wash your hands with soap and water. 3. High-efficiency particulate air (HEPA) cleaners run continuously in a bedroom or living room can reduce allergen levels  over time. 4. Regular use of a high-efficiency vacuum cleaner or a central vacuum can reduce allergen levels. 5. Giving your dog or cat a bath at least once a week can reduce airborne allergen.  Allergy Shots   Allergies are the result of a chain reaction that starts in the immune system. Your immune system controls how your body defends itself. For instance, if you have an allergy to pollen, your immune system identifies pollen as an invader or allergen. Your immune system overreacts by producing antibodies called Immunoglobulin E (IgE). These antibodies travel to cells that release chemicals, causing an allergic reaction.  The concept behind allergy immunotherapy, whether it is received in the form of shots or tablets, is that the immune system can be desensitized to specific allergens that trigger allergy symptoms. Although it requires time and patience, the payback can be long-term relief.  How Do Allergy Shots Work?  Allergy shots work much like a vaccine. Your body responds to injected amounts of a particular allergen given in increasing doses, eventually developing a resistance and tolerance to it. Allergy shots can lead to decreased, minimal or no allergy symptoms.  There generally are two phases: build-up and maintenance. Build-up often ranges from three to six months and involves receiving injections with increasing amounts of the allergens. The shots are typically given once or twice a week, though more rapid build-up schedules are sometimes used.  The maintenance phase begins when the most effective dose is reached. This dose is different for each person, depending on how allergic you are and your response to the build-up injections. Once the maintenance dose is reached, there are longer periods between injections, typically two to four weeks.  Occasionally doctors give cortisone-type shots that can temporarily reduce allergy symptoms. These types of shots are different and should not  be confused with allergy immunotherapy shots.  Who Can Be Treated with Allergy Shots?  Allergy shots may be a good treatment approach for people with allergic rhinitis (hay fever), allergic asthma, conjunctivitis (eye allergy) or stinging insect allergy.   Before deciding to begin allergy shots, you should consider:  . The length of allergy season and the severity of your symptoms . Whether medications and/or changes to your environment can control your symptoms . Your desire to avoid long-term medication use . Time: allergy immunotherapy requires a major time commitment . Cost: may vary depending on your insurance coverage  Allergy shots for children age 59 and older are effective and often well tolerated. They might prevent the onset of new allergen sensitivities or the progression to asthma.  Allergy shots are not started on patients who are pregnant but can be continued on patients who become pregnant while  receiving them. In some patients with other medical conditions or who take certain common medications, allergy shots may be of risk. It is important to mention other medications you talk to your allergist.   When Will I Feel Better?  Some may experience decreased allergy symptoms during the build-up phase. For others, it may take as long as 12 months on the maintenance dose. If there is no improvement after a year of maintenance, your allergist will discuss other treatment options with you.  If you aren't responding to allergy shots, it may be because there is not enough dose of the allergen in your vaccine or there are missing allergens that were not identified during your allergy testing. Other reasons could be that there are high levels of the allergen in your environment or major exposure to non-allergic triggers like tobacco smoke.  What Is the Length of Treatment?  Once the maintenance dose is reached, allergy shots are generally continued for three to five years. The  decision to stop should be discussed with your allergist at that time. Some people may experience a permanent reduction of allergy symptoms. Others may relapse and a longer course of allergy shots can be considered.  What Are the Possible Reactions?  The two types of adverse reactions that can occur with allergy shots are local and systemic. Common local reactions include very mild redness and swelling at the injection site, which can happen immediately or several hours after. A systemic reaction, which is less common, affects the entire body or a particular body system. They are usually mild and typically respond quickly to medications. Signs include increased allergy symptoms such as sneezing, a stuffy nose or hives.  Rarely, a serious systemic reaction called anaphylaxis can develop. Symptoms include swelling in the throat, wheezing, a feeling of tightness in the chest, nausea or dizziness. Most serious systemic reactions develop within 30 minutes of allergy shots. This is why it is strongly recommended you wait in your doctor's office for 30 minutes after your injections. Your allergist is trained to watch for reactions, and his or her staff is trained and equipped with the proper medications to identify and treat them.  Who Should Administer Allergy Shots?  The preferred location for receiving shots is your prescribing allergist's office. Injections can sometimes be given at another facility where the physician and staff are trained to recognize and treat reactions, and have received instructions by your prescribing allergist.

## 2017-04-08 NOTE — Progress Notes (Addendum)
Immunotherapy   Patient Details  Name: Meagan Chung MRN: [id number] Date of Birth: [birth year]  04/08/2017  Noah Delaine sample of fasenra 30 mg was given. Pt. Will come in monthly for 2 more doses then can receive the fasenra every 8 weeks. Following schedule: every 4 weeks for the next 2 doses.  Frequency:every 4 weeks for the next 2 doses, then every 8 weeks Epi-Pen:Epi-Pen Available  Consent signed and patient instructions given.   Revonda Humphrey 04/08/2017, 4:58 PM

## 2017-04-12 NOTE — Addendum Note (Signed)
Addended by: Felipa Emory on: 04/12/2017 08:12 AM   Modules accepted: Orders

## 2017-04-14 ENCOUNTER — Encounter: Payer: Self-pay | Admitting: Adult Health

## 2017-04-14 ENCOUNTER — Ambulatory Visit (INDEPENDENT_AMBULATORY_CARE_PROVIDER_SITE_OTHER): Payer: Commercial Managed Care - PPO | Admitting: Adult Health

## 2017-04-14 DIAGNOSIS — J302 Other seasonal allergic rhinitis: Secondary | ICD-10-CM

## 2017-04-14 DIAGNOSIS — J4550 Severe persistent asthma, uncomplicated: Secondary | ICD-10-CM

## 2017-04-14 DIAGNOSIS — J3089 Other allergic rhinitis: Secondary | ICD-10-CM

## 2017-04-14 DIAGNOSIS — K219 Gastro-esophageal reflux disease without esophagitis: Secondary | ICD-10-CM

## 2017-04-14 NOTE — Progress Notes (Signed)
Reviewed, agree 

## 2017-04-14 NOTE — Assessment & Plan Note (Signed)
Cont on GERD tx/diet

## 2017-04-14 NOTE — Assessment & Plan Note (Signed)
Cont on current regimen  

## 2017-04-14 NOTE — Assessment & Plan Note (Signed)
Severe persistent asthma with frequent exacerbations requiring prednisone.  Patient recently had exacerbation that is now resolved.  She has been able to taper completely off of prednisone.  She is continue on her controller inhaler.  She is also been started on an IL-5 -Fasenra per allergist.  She seems to be doing well.  She is advised on trigger control.  Asthma action plan discussed.  Plan  Patient Instructions  Finish Advair . Then begin AirDuo 1 puff Twice daily  . Rinse after use.  Continue with Berna Bue as directed.  Use Albuterol 2 puffs every 4hr as needed for wheezing .  Follow up with Dr. Lake Bells in 2 months and As needed   Follow up with Allergy as planned.  Please contact office for sooner follow up if symptoms do not improve or worsen or seek emergency care

## 2017-04-14 NOTE — Patient Instructions (Signed)
Finish Advair . Then begin AirDuo 1 puff Twice daily  . Rinse after use.  Continue with Berna Bue as directed.  Use Albuterol 2 puffs every 4hr as needed for wheezing .  Follow up with Dr. Lake Bells in 2 months and As needed   Follow up with Allergy as planned.  Please contact office for sooner follow up if symptoms do not improve or worsen or seek emergency care

## 2017-04-14 NOTE — Progress Notes (Signed)
@Patient  ID: Meagan Chung, female    DOB: 1985-08-27, 32 y.o.   MRN: [id number]  Chief Complaint  Patient presents with  . Follow-up    Asthma     Referring provider: Mosie Lukes, MD  HPI: 32 yo female never smoker seen for pulmonary consult 03/24/17 for difficult to control asthma with recurrent/ exacerbations requiring frequent prednisone . Dx with Asthma at age 8 ( previous hospitalizations for asthma several times over the years)    TEST  2014 from [facility]: pre bronchodilator FEV1 110% predicted, ratio 78%, 30% change in FEF 25-75. Chest x-ray March 24, 2017 with mildly increased pulmonary interstitial markings similar to 2016. 04/08/17 >Spirometry nml with FEV1 109%, ratio 81 , FVC 111%.   04/14/2017 Follow up : Severe Persistent Asthma  Patient presents for a 3-week follow-up.  Patient was seen last visit for a pulmonary consult for difficult to control asthma.  Patient was started on AirDuo twice daily.  Prednisone taper.   Patient says that symptoms are starting to improve.  She was seen by allergist on February 8 she was given Advair until AiirDuo was available at her pharmacy.  It was on back order.  She was also started on Fasenra injection .  Patient says she finished prednisone 2 days ago.  Has not had any flare of wheezing or shortness of breath.  Feels that her breathing is doing very well.  She denies any use of albuterol.  Has had a mild headache since starting Fasenra .  Patient says she picked up AirDuo yesterday from the pharmacy and plans on starting this as soon as she is finished with Advair.  Her insurance does not cover inhalers very well.  But says she can afford AirDuo ($90/mon) .       Allergies  Allergen Reactions  . Other Cough    Capers causes SOB Cats 4+    Immunization History  Administered Date(s) Administered  . HPV Quadrivalent 06/18/2010, 08/19/2010  . Hepatitis A 05/22/2014  . Influenza,inj,Quad PF,6-35 Mos 12/17/2016  .  Influenza-Unspecified 02/01/2013, 12/17/2016  . Pneumococcal Conjugate-13 02/01/2013  . Pneumococcal Polysaccharide-23 04/01/2013, 05/03/2013  . Tdap 05/22/2014  . Typhoid Inactivated 05/22/2014  . Yellow Fever 05/22/2014    Past Medical History:  Diagnosis Date  . Anxiety and depression    anxiety, bulemia in past all well controlled   . Asthma   . Depression    anxiety, bulemia in past all well controlled  . Diarrhea 11/29/2014  . Hyperlipidemia, mixed 02/02/2015  . Thyroid nodule     Tobacco History: Social History   Tobacco Use  Smoking Status Never Smoker  Smokeless Tobacco Never Used   Counseling given: Not Answered   Outpatient Encounter Medications as of 04/14/2017  Medication Sig  . albuterol (PROVENTIL) (2.5 MG/3ML) 0.083% nebulizer solution Take 3 mLs (2.5 mg total) by nebulization every 6 (six) hours as needed for wheezing or shortness of breath.  Marland Kitchen albuterol (VENTOLIN HFA) 108 (90 Base) MCG/ACT inhaler Inhale 2 puffs into the lungs every 6 (six) hours as needed for wheezing or shortness of breath.  . Fluticasone-Salmeterol (ADVAIR) 500-50 MCG/DOSE AEPB Inhale 1 puff into the lungs 2 (two) times daily.  . montelukast (SINGULAIR) 10 MG tablet Take 1 tablet (10 mg total) by mouth at bedtime as needed.  . Norgestimate-Ethinyl Estradiol Triphasic (ORTHO TRI-CYCLEN LO) 0.18/0.215/0.25 MG-25 MCG tab Take 1 tablet by mouth daily.  . pantoprazole (PROTONIX) 40 MG tablet Take 1 tablet (40 mg  total) by mouth daily before breakfast.  . sertraline (ZOLOFT) 100 MG tablet TK 1 T PO D  . valACYclovir (VALTREX) 500 MG tablet TAKE 1 TABLET BY MOUTH TWICE DAILY FOR 3 DAYS THEN TAKE 1 TABLET BY MOUTH DAILY  . [DISCONTINUED] methylPREDNISolone (MEDROL) 4 MG tablet 5 tab po qd X 1d then 4 tab po qd X 1d then 3 tab po qd X 1d then 2 tab po qd then 1 tab po qd  . [DISCONTINUED] predniSONE (DELTASONE) 10 MG tablet 4 tablets x 2 days, 3 tabs x 2 days, 2 tabs x 2 days, 1 tab x 2 days (Patient  taking differently: 20 mg. 4 tablets x 2 days, 3 tabs x 2 days, 2 tabs x 2 days, 1 tab x 2 days)  . Fluticasone-Salmeterol (AIRDUO RESPICLICK 188/41) 660-63 MCG/ACT AEPB Inhale 1 puff into the lungs 2 (two) times daily. (Patient not taking: Reported on 04/14/2017)  . [DISCONTINUED] budesonide-formoterol (SYMBICORT) 160-4.5 MCG/ACT inhaler Inhale 2 puffs into the lungs 2 (two) times daily. (Patient not taking: Reported on 04/14/2017)  . [DISCONTINUED] mometasone (ASMANEX 60 METERED DOSES) 220 MCG/INH inhaler Inhale 1 puff into the lungs daily. (Patient not taking: Reported on 04/14/2017)   No facility-administered encounter medications on file as of 04/14/2017.      Review of Systems  Constitutional:   No  weight loss, night sweats,  Fevers, chills, fatigue, or  lassitude.  HEENT:   No headaches,  Difficulty swallowing,  Tooth/dental problems, or  Sore throat,                No sneezing, itching, ear ache, nasal congestion, post nasal drip,   CV:  No chest pain,  Orthopnea, PND, swelling in lower extremities, anasarca, dizziness, palpitations, syncope.   GI  No heartburn, indigestion, abdominal pain, nausea, vomiting, diarrhea, change in bowel habits, loss of appetite, bloody stools.   Resp: No shortness of breath with exertion or at rest.  No excess mucus, no productive cough,  No non-productive cough,  No coughing up of blood.  No change in color of mucus.  No wheezing.  No chest wall deformity  Skin: no rash or lesions.  GU: no dysuria, change in color of urine, no urgency or frequency.  No flank pain, no hematuria   MS:  No joint pain or swelling.  No decreased range of motion.  No back pain.    Physical Exam  BP 132/76 (BP Location: Right Arm, Cuff Size: Normal)   Pulse 80   Ht 5\' 6"  (1.676 m)   Wt 208 lb (94.3 kg)   SpO2 98%   BMI 33.57 kg/m   GEN: A/Ox3; pleasant , NAD, well nourished    HEENT:  [place]/AT,  EACs-clear, TMs-wnl, NOSE-clear, THROAT-clear, no lesions, no  postnasal drip or exudate noted.   NECK:  Supple w/ fair ROM; no JVD; normal carotid impulses w/o bruits; no thyromegaly or nodules palpated; no lymphadenopathy.    RESP  Clear  P & A; w/o, wheezes/ rales/ or rhonchi. no accessory muscle use, no dullness to percussion  CARD:  RRR, no m/r/g, no peripheral edema, pulses intact, no cyanosis or clubbing.  GI:   Soft & nt; nml bowel sounds; no organomegaly or masses detected.   Musco: Warm bil, no deformities or joint swelling noted.   Neuro: alert, no focal deficits noted.    Skin: Warm, no lesions or rashes    Lab Results:   BMET  BNP No results found for: BNP  ProBNP No results found for: PROBNP  Imaging: Dg Chest 2 View  Result Date: 03/24/2017 CLINICAL DATA:  32 year old female with shortness of Breath. History of asthma. Recently on prednisone. EXAM: CHEST  2 VIEW COMPARISON:  01/31/2015 and earlier chest radiographs. FINDINGS: Mild bilateral increased pulmonary interstitial markings appear similar to that on 01/31/2015. Lung volumes are stable. No pneumothorax, pleural effusion or confluent pulmonary opacity. Normal cardiac size and mediastinal contours. Visualized tracheal air column is within normal limits. No acute osseous abnormality identified. Negative visible bowel gas pattern. IMPRESSION: Mildly increased nonspecific pulmonary interstitial markings, similar to the 2016 comparison. Chronic versus reactive airway changes are favored over acute viral/atypical respiratory infection. Electronically Signed   By: Genevie Ann M.D.   On: 03/24/2017 17:34     Assessment & Plan:   Asthma Severe persistent asthma with frequent exacerbations requiring prednisone.  Patient recently had exacerbation that is now resolved.  She has been able to taper completely off of prednisone.  She is continue on her controller inhaler.  She is also been started on an IL-5 -Fasenra per allergist.  She seems to be doing well.  She is advised on trigger  control.  Asthma action plan discussed.  Plan  Patient Instructions  Finish Advair . Then begin AirDuo 1 puff Twice daily  . Rinse after use.  Continue with Berna Bue as directed.  Use Albuterol 2 puffs every 4hr as needed for wheezing .  Follow up with Dr. Lake Bells in 2 months and As needed   Follow up with Allergy as planned.  Please contact office for sooner follow up if symptoms do not improve or worsen or seek emergency care       Esophageal reflux Cont on GERD tx/diet   Seasonal and perennial allergic rhinitis Cont on current regimen      Rexene Edison, NP 04/14/2017

## 2017-04-15 LAB — DIPHTHERIA / TETANUS ANTIBODY PANEL
DIPHTHERIA AB: 0.91 [IU]/mL (ref <0.10)
Tetanus Ab, IgG: 1.96 IU/mL (ref <0.10)

## 2017-04-15 LAB — HYPERSENSITIVITY PNEUMONITIS
A. Pullulans Abs: NEGATIVE
A.Fumigatus #1 Abs: NEGATIVE
Micropolyspora faeni, IgG: NEGATIVE
PIGEON SERUM ABS: NEGATIVE
Thermoact. Saccharii: NEGATIVE
Thermoactinomyces vulgaris, IgG: NEGATIVE

## 2017-04-15 LAB — STREP PNEUMONIAE 23 SEROTYPES IGG
PNEUMO AB TYPE 17 (17F): 4.9 ug/mL (ref >1.3)
PNEUMO AB TYPE 19 (19F): 7.7 ug/mL (ref >1.3)
PNEUMO AB TYPE 20: 7.8 ug/mL (ref >1.3)
PNEUMO AB TYPE 22 (22F): 1.6 ug/mL (ref >1.3)
PNEUMO AB TYPE 23 (23F): 4.2 ug/mL (ref >1.3)
PNEUMO AB TYPE 34 (10A): 11.9 ug/mL (ref >1.3)
PNEUMO AB TYPE 3: 8.8 ug/mL (ref >1.3)
PNEUMO AB TYPE 43 (11A): 3 ug/mL (ref >1.3)
PNEUMO AB TYPE 54 (15B): 20.8 ug/mL (ref >1.3)
PNEUMO AB TYPE 57 (19A): 2.5 ug/mL (ref >1.3)
PNEUMO AB TYPE 68 (9V): 3 ug/mL (ref >1.3)
PNEUMO AB TYPE 70 (33F): 1.9 ug/mL (ref >1.3)
PNEUMO AB TYPE 8: 11.5 ug/mL (ref >1.3)
PNEUMO AB TYPE 9 (9N): 11.1 ug/mL (ref >1.3)
Pneumo Ab Type 1*: 1.6 ug/mL (ref >1.3)
Pneumo Ab Type 12 (12F)*: 0.3 ug/mL — ABNORMAL LOW (ref >1.3)
Pneumo Ab Type 14*: >24.3 ug/mL (ref >1.3)
Pneumo Ab Type 2*: 28.2 ug/mL (ref >1.3)
Pneumo Ab Type 26 (6B)*: 1.6 ug/mL (ref >1.3)
Pneumo Ab Type 4*: 3.1 ug/mL (ref >1.3)
Pneumo Ab Type 5*: 4.7 ug/mL (ref >1.3)
Pneumo Ab Type 51 (7F)*: 10.1 ug/mL (ref >1.3)
Pneumo Ab Type 56 (18C)*: 2.8 ug/mL (ref >1.3)

## 2017-04-15 LAB — ALPHA-1-ANTITRYPSIN DEFICIENCY

## 2017-04-15 LAB — IGG, IGA, IGM
IgA/Immunoglobulin A, Serum: 126 mg/dL (ref 87–352)
IgG (Immunoglobin G), Serum: 887 mg/dL (ref 700–1600)
IgM (Immunoglobulin M), Srm: 241 mg/dL — ABNORMAL HIGH (ref 26–217)

## 2017-04-15 LAB — ASPERGILLUS PRECIPITINS
ASPERGILLUS FLAVUS ANTIBODIES: NEGATIVE
ASPERGILLUS NIGER ANTIBODIES: NEGATIVE

## 2017-04-15 LAB — COMPLEMENT, TOTAL: Compl, Total (CH50): >60 U/mL (ref >41)

## 2017-04-15 LAB — CBC WITH DIFFERENTIAL

## 2017-04-15 LAB — THYROID ANTIBODIES
THYROGLOBULIN ANTIBODY: 13.3 [IU]/mL — AB (ref 0.0–0.9)
THYROID PEROXIDASE ANTIBODY: 19 [IU]/mL (ref 0–34)

## 2017-04-15 LAB — IGE: IgE (Immunoglobulin E), Serum: 84 IU/mL (ref 0–100)

## 2017-04-15 LAB — TRYPTASE: TRYPTASE: 3.4 ug/L (ref 2.2–13.2)

## 2017-04-22 ENCOUNTER — Ambulatory Visit: Payer: Self-pay | Admitting: *Deleted

## 2017-05-06 ENCOUNTER — Encounter: Payer: Self-pay | Admitting: Allergy & Immunology

## 2017-05-06 ENCOUNTER — Ambulatory Visit (INDEPENDENT_AMBULATORY_CARE_PROVIDER_SITE_OTHER): Payer: Commercial Managed Care - PPO | Admitting: Allergy & Immunology

## 2017-05-06 ENCOUNTER — Ambulatory Visit: Payer: Commercial Managed Care - PPO

## 2017-05-06 VITALS — BP 100/66 | HR 80 | Temp 98.1°F | Resp 12

## 2017-05-06 DIAGNOSIS — J302 Other seasonal allergic rhinitis: Secondary | ICD-10-CM

## 2017-05-06 DIAGNOSIS — J3089 Other allergic rhinitis: Secondary | ICD-10-CM | POA: Diagnosis not present

## 2017-05-06 DIAGNOSIS — J4550 Severe persistent asthma, uncomplicated: Secondary | ICD-10-CM | POA: Diagnosis not present

## 2017-05-06 DIAGNOSIS — B999 Unspecified infectious disease: Secondary | ICD-10-CM

## 2017-05-06 DIAGNOSIS — L508 Other urticaria: Secondary | ICD-10-CM

## 2017-05-06 MED ORDER — FLUTICASONE-SALMETEROL 500-50 MCG/DOSE IN AEPB: 1.0000 | INHALATION_SPRAY | Freq: Two times a day (BID) | RESPIRATORY_TRACT | 5 refills | Status: DC

## 2017-05-06 NOTE — Progress Notes (Signed)
FOLLOW UP  Date of Service/Encounter:  05/06/17   Assessment:   Severe persistent asthma - stable on Fasenra  Recurrent infections - with normal screening  Seasonal and perennial allergic rhinitis (trees, weeds, grasses, indoor molds, dust mites, cat and dog)  Chronic urticaria - resolved   Asthma Reportables:  Severity: severe persistent  Risk: high Control: well controlled   Plan/Recommendations:    1. Severe persistent asthma without complication - Lung function looked great today - We will not make any medication changes at this time.   - Berna Bue has officially been approved base on prednisone sparing indication.  - Daily controller medication(s): Fasenra monthly x 3 doses (then every 8 weeks thereafter), Singulair 10mg  daily and Advair 500/53mcg one puff twice daily - Prior to physical activity: ProAir 2 puffs 10-15 minutes before physical activity. - Rescue medications: ProAir 4 puffs every 4-6 hours as needed - Asthma control goals:  * Full participation in all desired activities (may need albuterol before activity) * Albuterol use two time or less a week on average (not counting use with activity) * Cough interfering with sleep two time or less a month * Oral steroids no more than once a year * No hospitalizations  2. Perennial and seasonal allergic rhinitis (trees, weeds, grasses, indoor molds, dust mites, cat and dog) - Continue with taking: Zyrtec (cetirizine) 10mg  tablet once daily as needed - You can use an extra dose of the antihistamine, if needed, for breakthrough symptoms. - Meagan Chung lacks any ocular and nasal symptoms, therefore we are deferring on allergen immunotherapy at this time. - We are also avoiding nasal sprays since her symptoms are very minor.   3. Return in about 3 months (around 08/06/2017).  Subjective:   Meagan Chung is a 32 y.o. female presenting today for follow up of  Chief Complaint  Patient presents with  . Allergic Rhinitis   .  Asthma  . Urticaria    Meagan Chung has a history of the following: Patient Active Problem List   Diagnosis Date Noted  . Recurrent infections 04/08/2017  . Seasonal and perennial allergic rhinitis 04/08/2017  . Chronic asthma without complication 32/67/1245  . Anxiety and depression   . Thyroid nodule   . Esophageal reflux   . Hypokalemia   . Asthma 11/10/2014  . Irritable bowel syndrome 12/19/2012  . Insomnia 12/19/2012  . History of bulimia 12/19/2012  . 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 Hashimoto's thyroiditis 01/06/2011  . Depression 01/06/2011    History obtained from: chart review and patient.  Juanda Bond [facility] Primary Care Provider is Mosie Lukes, MD.     Meagan Chung is a 32 y.o. female presenting for a follow up visit. I first saw Meagan Chung as a new patient around one month ago. At that time, we did allergy testing that indicated a positives to trees, weeds, grasses, indoor molds, dust mites, cat and dog. We did not start any nasal or ocular medications since she was not reporting any problems. Her main manifestation of her allergies included respiratory symptoms only. For her asthma, she was on Advair 500/50 one puff twice daily, but she has having trouble affording her medications. She had been on prednisone a number of times and was actively on prednisone the last time that I saw her. She had an AEC of 1000 within the last year, therefore we emergently started [facility] and have since got it approved back on prednisone sparing indication. She also had a  history of chronic urticaria that had improved since the birth of her daughter.   Since the last visit, she has done well. She did get the Berna Bue last time and she had a headache for one week. This did not improve without ibuprofen, but eventually improved over the course of one week. She got her second dose of Berna Bue today and will watch for headaches. She otherwise thinks that the Berna Bue has helped with her symptoms.  She is off of the prednisone and is using her controller medication. Meagan Chung's asthma has been well controlled. She has not required rescue medication, experienced nocturnal awakenings due to lower respiratory symptoms, nor have activities of daily living been limited. She has required no Emergency Department or Urgent Care visits for her asthma. She has required zero courses of systemic steroids for asthma exacerbations since the last visit. ACT score today is 20, indicating excellent asthma symptom control.   She is otherwise doing well. She remains on the Singulair and the cetirizine 10mg  daily. She is not interested in allergy shots, as she is mostly asymptomatic. Immune workup was normal and she has had no infections since the last visit.   She continues to follow with her PCP about her thyroid medication. She is in the middle of getting worked up for Hashimoto's thyroiditis. Unfortunately, her husband recently moved out two weeks ago. She thinks that this is secondary to the steroids he is on for his body building. She is unsure whether he is coming back again.   Otherwise, there have been no changes to her past medical history, surgical history, family history, or social history.    Review of Systems: a 14-point review of systems is pertinent for what is mentioned in HPI.  Otherwise, all other systems were negative. Constitutional: negative other than that listed in the HPI Eyes: negative other than that listed in the HPI Ears, nose, mouth, throat, and face: negative other than that listed in the HPI Respiratory: negative other than that listed in the HPI Cardiovascular: negative other than that listed in the HPI Gastrointestinal: negative other than that listed in the HPI Genitourinary: negative other than that listed in the HPI Integument: negative other than that listed in the HPI Hematologic: negative other than that listed in the HPI Musculoskeletal: negative other than that listed in  the HPI Neurological: negative other than that listed in the HPI Allergy/Immunologic: negative other than that listed in the HPI    Objective:   Blood pressure 100/66, pulse 80, temperature 98.1 F (36.7 C), temperature source Oral, resp. rate 12, SpO2 97 %, not currently breastfeeding. There is no height or weight on file to calculate BMI.   Physical Exam:  General: Alert, interactive, in no acute distress. Pleasant and steely eyed.  Eyes: No conjunctival injection bilaterally, no discharge on the right, no discharge on the left and no Horner-Trantas dots present. PERRL bilaterally. EOMI without pain. No photophobia.  Ears: Right TM pearly gray with normal light reflex, Left TM pearly gray with normal light reflex, Right TM intact without perforation and Left TM intact without perforation.  Nose/Throat: External nose within normal limits and septum midline. Turbinates edematous and pale with clear discharge. Posterior oropharynx erythematous without cobblestoning in the posterior oropharynx. Tonsils 2+ without exudates.  Tongue without thrush. Lungs: Clear to auscultation without wheezing, rhonchi or rales. No increased work of breathing. CV: Normal S1/S2. No murmurs. Capillary refill <2 seconds.  Skin: Warm and dry, without lesions or rashes. Neuro:   Grossly  intact. No focal deficits appreciated. Responsive to questions.  Diagnostic studies:   Spirometry: results normal (FEV1: 2.89/113%, FVC: 4.83/117%, FEV1/FVC: 81%) .    Spirometry consistent with normal pattern.   Allergy Studies: none     Salvatore Marvel, MD [facility]

## 2017-05-06 NOTE — Patient Instructions (Addendum)
1. Severe persistent asthma without complication - Lung function looked great today - We will not make any medication changes aet this time.   - Daily controller medication(s): Fasenra monthly x 3 doses (then every 8 weeks thereafter), Singulair 10mg  daily and Advair 500/44mcg one puff twice daily - Prior to physical activity: ProAir 2 puffs 10-15 minutes before physical activity. - Rescue medications: ProAir 4 puffs every 4-6 hours as needed - Asthma control goals:  * Full participation in all desired activities (may need albuterol before activity) * Albuterol use two time or less a week on average (not counting use with activity) * Cough interfering with sleep two time or less a month * Oral steroids no more than once a year * No hospitalizations  2. Perennial and seasonal allergic rhinitis (trees, weeds, grasses, indoor molds, dust mites, cat and dog) - Continue with taking: Zyrtec (cetirizine) 10mg  tablet once daily as needed - You can use an extra dose of the antihistamine, if needed, for breakthrough symptoms.   3. Return in about 3 months (around 08/06/2017).   Please inform us of any Emergency Department visits, hospitalizations, or changes in symptoms. Call us before going to the ED for breathing or allergy symptoms since we might be able to fit you in for a sick visit. Feel free to contact us anytime with any questions, problems, or concerns.  It was a pleasure to see you again today!  Websites that have reliable patient information: 1. American Academy of Asthma, Allergy, and Immunology: www.aaaai.org 2. Food Allergy Research and Education (FARE): foodallergy.org 3. Mothers of Asthmatics: http://www.asthmacommunitynetwork.org 4. American College of Allergy, Asthma, and Immunology: www.acaai.org

## 2017-06-09 ENCOUNTER — Ambulatory Visit: Payer: Commercial Managed Care - PPO | Admitting: Pulmonary Disease

## 2017-06-16 ENCOUNTER — Ambulatory Visit (INDEPENDENT_AMBULATORY_CARE_PROVIDER_SITE_OTHER): Payer: Commercial Managed Care - PPO | Admitting: Family Medicine

## 2017-06-16 ENCOUNTER — Ambulatory Visit (HOSPITAL_BASED_OUTPATIENT_CLINIC_OR_DEPARTMENT_OTHER)
Admission: RE | Admit: 2017-06-16 | Discharge: 2017-06-16 | Disposition: A | Discharge status: Home / Self Care | Payer: Commercial Managed Care - PPO | Source: Ambulatory Visit | Attending: Family Medicine | Admitting: Family Medicine

## 2017-06-16 ENCOUNTER — Encounter: Payer: Self-pay | Admitting: Family Medicine

## 2017-06-16 VITALS — BP 110/72 | HR 82 | Temp 98.0°F | Resp 16 | Ht 66.0 in | Wt 194.2 lb

## 2017-06-16 DIAGNOSIS — E079 Disorder of thyroid, unspecified: Secondary | ICD-10-CM | POA: Diagnosis not present

## 2017-06-16 DIAGNOSIS — J4550 Severe persistent asthma, uncomplicated: Secondary | ICD-10-CM | POA: Diagnosis not present

## 2017-06-16 DIAGNOSIS — F329 Major depressive disorder, single episode, unspecified: Secondary | ICD-10-CM

## 2017-06-16 DIAGNOSIS — E041 Nontoxic single thyroid nodule: Secondary | ICD-10-CM

## 2017-06-16 DIAGNOSIS — F419 Anxiety disorder, unspecified: Secondary | ICD-10-CM | POA: Diagnosis not present

## 2017-06-16 DIAGNOSIS — E063 Autoimmune thyroiditis: Secondary | ICD-10-CM | POA: Diagnosis not present

## 2017-06-16 DIAGNOSIS — K219 Gastro-esophageal reflux disease without esophagitis: Secondary | ICD-10-CM

## 2017-06-16 DIAGNOSIS — F32A Depression, unspecified: Secondary | ICD-10-CM

## 2017-06-16 LAB — T4, FREE: Free T4: 0.85 ng/dL (ref 0.60–1.60)

## 2017-06-16 LAB — TSH: TSH: 0.87 u[IU]/mL (ref 0.35–4.50)

## 2017-06-16 MED ORDER — SERTRALINE HCL 100 MG PO TABS: 150.0000 mg | ORAL_TABLET | Freq: Every day | ORAL | 3 refills | Status: DC

## 2017-06-16 NOTE — Patient Instructions (Signed)

## 2017-06-17 ENCOUNTER — Ambulatory Visit: Payer: Self-pay

## 2017-06-17 LAB — THYROID PEROXIDASE ANTIBODY: Thyroperoxidase Ab SerPl-aCnc: 3 IU/mL (ref <9)

## 2017-06-17 LAB — THYROGLOBULIN ANTIBODY: THYROGLOBULIN AB: 9 [IU]/mL — AB (ref <=1)

## 2017-06-20 NOTE — Progress Notes (Signed)
Patient ID: Meagan Chung, female   DOB: 18-Apr-1985, 32 y.o.   MRN: [id number]   Subjective:    Patient ID: Meagan Chung, female    DOB: [birth year], 32 y.o.   MRN: [id number]  Chief Complaint  Patient presents with  . Asthma    Pt here for follow up  . anxiety and depression    Pt here for follow up    HPI Patient is in today for follow up. And she is noting a great improvement in her allergies and asthma since starting [place] with her allergist, Dr Ernst Bowler. She notes she has significant anxiety and malaise as well. She notes in the past she was told she had a thyroid nodule but she has not had it imaged in years. No dysphagia, cough or other concerning symptoms. Denies CP/palp/SOB/HA/congestion/fevers/GI or GU c/o. Taking meds as prescribed  Past Medical History:  Diagnosis Date  . Anxiety and depression    anxiety, bulemia in past all well controlled   . Asthma   . Depression    anxiety, bulemia in past all well controlled  . Diarrhea 11/29/2014  . Hyperlipidemia, mixed 02/02/2015  . Thyroid nodule     Past Surgical History:  Procedure Laterality Date  . ANKLE SURGERY    . WISDOM TOOTH EXTRACTION Bilateral     Family History  Problem Relation Age of Onset  . Stroke Father        2  . Hyperlipidemia Father   . Allergic rhinitis Father   . Anorexia nervosa Sister        80  . Kidney Stones Sister   . Thyroid disease Brother   . Obesity Brother   . Asthma Brother   . Dementia Maternal Grandmother   . Osteoporosis Maternal Grandmother   . Cancer Maternal Grandmother        breast  . Cancer Maternal Grandfather        prostate  . Alcohol abuse Maternal Grandfather   . Cancer Paternal Grandmother 71       3 masses in pancreas and thyroid, suspect cancer  . Hyperlipidemia Paternal Grandmother   . Eczema Mother   . Urticaria Mother   . Immunodeficiency Neg Hx   . Angioedema Neg Hx     Social History   Socioeconomic History  . Marital status: Married    Spouse  name: Not on file  . Number of children: 0  . Years of education: Not on file  . Highest education level: Not on file  Occupational History  . Occupation: development  Social Needs  . Financial resource strain: Not on file  . Food insecurity:    Worry: Not on file    Inability: Not on file  . Transportation needs:    Medical: Not on file    Non-medical: Not on file  Tobacco Use  . Smoking status: Never Smoker  . Smokeless tobacco: Never Used  Substance and Sexual Activity  . Alcohol use: Yes    Alcohol/week: 0.0 oz    Comment: rarely  . Drug use: No  . Sexual activity: Yes    Comment: lives with fiance, fund raise for [employer], avoids gluten, minimize dairy,  Lifestyle  . Physical activity:    Days per week: Not on file    Minutes per session: Not on file  . Stress: Not on file  Relationships  . Social connections:    Talks on phone: Not on file    Gets together: Not  on file    Attends religious service: Not on file    Active member of club or organization: Not on file    Attends meetings of clubs or organizations: Not on file    Relationship status: Not on file  . Intimate partner violence:    Fear of current or ex partner: Not on file    Emotionally abused: Not on file    Physically abused: Not on file    Forced sexual activity: Not on file  Other Topics Concern  . Not on file  Social History Narrative  . Not on file    Outpatient Medications Prior to Visit  Medication Sig Dispense Refill  . albuterol (PROVENTIL) (2.5 MG/3ML) 0.083% nebulizer solution Take 3 mLs (2.5 mg total) by nebulization every 6 (six) hours as needed for wheezing or shortness of breath. 150 mL 1  . albuterol (VENTOLIN HFA) 108 (90 Base) MCG/ACT inhaler Inhale 2 puffs into the lungs every 6 (six) hours as needed for wheezing or shortness of breath. 1 Inhaler 3  . Benralizumab (FASENRA) 30 MG/ML SOSY Inject into the skin.    Marland Kitchen sertraline (ZOLOFT) 100 MG tablet TK 1 T PO D  11  .  cetirizine (ZYRTEC) 10 MG tablet Take by mouth.    . Fluticasone-Salmeterol (ADVAIR DISKUS) 500-50 MCG/DOSE AEPB Inhale 1 puff into the lungs 2 (two) times daily. (Patient not taking: Reported on 06/16/2017) 1 each 5  . Fluticasone-Salmeterol (ADVAIR) 500-50 MCG/DOSE AEPB Inhale 1 puff into the lungs 2 (two) times daily. (Patient not taking: Reported on 06/16/2017) 60 each 5  . montelukast (SINGULAIR) 10 MG tablet Take 1 tablet (10 mg total) by mouth at bedtime as needed. (Patient not taking: Reported on 06/16/2017) 30 tablet 3  . Norgestimate-Ethinyl Estradiol Triphasic (ORTHO TRI-CYCLEN LO) 0.18/0.215/0.25 MG-25 MCG tab Take 1 tablet by mouth daily.    . pantoprazole (PROTONIX) 40 MG tablet Take 1 tablet (40 mg total) by mouth daily before breakfast. (Patient not taking: Reported on 06/16/2017) 30 tablet 4  . valACYclovir (VALTREX) 500 MG tablet TAKE 1 TABLET BY MOUTH TWICE DAILY FOR 3 DAYS THEN TAKE 1 TABLET BY MOUTH DAILY (Patient not taking: Reported on 06/16/2017) 35 tablet 0   No facility-administered medications prior to visit.     Allergies  Allergen Reactions  . Other Cough    Capers causes SOB Cats 4+    Review of Systems  Constitutional: Positive for malaise/fatigue. Negative for fever.  HENT: Positive for congestion.   Eyes: Negative for blurred vision.  Respiratory: Negative for shortness of breath.   Cardiovascular: Negative for chest pain, palpitations and leg swelling.  Gastrointestinal: Negative for abdominal pain, blood in stool and nausea.  Genitourinary: Negative for dysuria and frequency.  Musculoskeletal: Negative for falls.  Skin: Negative for rash.  Neurological: Negative for dizziness, loss of consciousness and headaches.  Endo/Heme/Allergies: Negative for environmental allergies.  Psychiatric/Behavioral: Negative for depression. The patient is nervous/anxious.        Objective:    Physical Exam  Constitutional: She is oriented to person, place, and time. She  appears well-developed and well-nourished. No distress.  HENT:  Head: Normocephalic and atraumatic.  Nose: Nose normal.  Eyes: Right eye exhibits no discharge. Left eye exhibits no discharge.  Neck: Normal range of motion. Neck supple.  Cardiovascular: Normal rate and regular rhythm.  No murmur heard. Pulmonary/Chest: Effort normal and breath sounds normal.  Abdominal: Soft. Bowel sounds are normal. There is no tenderness.  Musculoskeletal: She exhibits  no edema.  Neurological: She is alert and oriented to person, place, and time.  Skin: Skin is warm and dry.  Psychiatric: She has a normal mood and affect.  Nursing note and vitals reviewed.   BP 110/72 (Cuff Size: Large)   Pulse 82   Temp 98 F (36.7 C) (Oral)   Resp 16   Ht 5\' 6"  (1.676 m)   Wt 194 lb 3.2 oz (88.1 kg)   LMP 06/08/2017   SpO2 98%   BMI 31.34 kg/m  Wt Readings from Last 3 Encounters:  06/16/17 194 lb 3.2 oz (88.1 kg)  04/14/17 208 lb (94.3 kg)  03/24/17 216 lb (98 kg)     Lab Results  Component Value Date   WBC CANCELED 04/08/2017   HGB 13.3 06/14/2016   HCT 40.2 06/14/2016   PLT 280.0 06/14/2016   GLUCOSE 80 01/31/2015   CHOL 155 01/31/2015   TRIG 44.0 01/31/2015   HDL 45.90 01/31/2015   LDLCALC 100 (H) 01/31/2015   ALT 13 01/31/2015   AST 14 01/31/2015   NA 139 01/31/2015   K 4.4 01/31/2015   CL 106 01/31/2015   CREATININE 0.79 01/31/2015   BUN 13 01/31/2015   CO2 26 01/31/2015   TSH 0.87 06/16/2017   HGBA1C 5.2 01/31/2015    Lab Results  Component Value Date   TSH 0.87 06/16/2017   Lab Results  Component Value Date   WBC CANCELED 04/08/2017   HGB 13.3 06/14/2016   HCT 40.2 06/14/2016   MCV 87.3 06/14/2016   PLT 280.0 06/14/2016   Lab Results  Component Value Date   NA 139 01/31/2015   K 4.4 01/31/2015   CO2 26 01/31/2015   GLUCOSE 80 01/31/2015   BUN 13 01/31/2015   CREATININE 0.79 01/31/2015   BILITOT 0.6 01/31/2015   ALKPHOS 77 01/31/2015   AST 14 01/31/2015   ALT  13 01/31/2015   PROT 7.5 01/31/2015   ALBUMIN 4.5 01/31/2015   CALCIUM 9.4 01/31/2015   ANIONGAP 7 11/11/2014   GFR 90.49 01/31/2015   Lab Results  Component Value Date   CHOL 155 01/31/2015   Lab Results  Component Value Date   HDL 45.90 01/31/2015   Lab Results  Component Value Date   LDLCALC 100 (H) 01/31/2015   Lab Results  Component Value Date   TRIG 44.0 01/31/2015   Lab Results  Component Value Date   CHOLHDL 3 01/31/2015   Lab Results  Component Value Date   HGBA1C 5.2 01/31/2015       Assessment & Plan:   Problem List Items Addressed This Visit    Asthma    Follows with Dr Ernst Bowler, allergist and is doing much better since starting Fasenra. No flares or hospitalizations      Esophageal reflux    Avoid offending foods, start probiotics. Do not eat large meals in late evening and consider raising head of bed.       Anxiety and depression    Sertraline 100 mg daily      Relevant Medications   sertraline (ZOLOFT) 100 MG tablet   Thyroid nodule    Ultrasound unremarkable, showed some small b/l nodules, borderline thyromegaly. thyroid labs largely normal. Thyroglobulin antibodies are up slightly but improving, no further work up at this time      Relevant Orders   US THYROID (Completed)    Other Visit Diagnoses    Thyroid disease    -  Primary   Relevant Orders  TSH (Completed)   T4, free (Completed)   Thyroid peroxidase antibody (Completed)   Thyroglobulin antibody (Completed)      I have discontinued Juanda Bond. Mccaughan's valACYclovir, Norgestimate-Ethinyl Estradiol Triphasic, montelukast, pantoprazole, Fluticasone-Salmeterol, cetirizine, and Fluticasone-Salmeterol. I have also changed her sertraline. Additionally, I am having her maintain her albuterol, albuterol, and Benralizumab.  Meds ordered this encounter  Medications  . sertraline (ZOLOFT) 100 MG tablet    Sig: Take 1.5 tablets (150 mg total) by mouth daily.    Dispense:  45 tablet     Refill:  3      Penni Homans, MD

## 2017-06-20 NOTE — Assessment & Plan Note (Signed)
Avoid offending foods, start probiotics. Do not eat large meals in late evening and consider raising head of bed.  

## 2017-06-20 NOTE — Assessment & Plan Note (Signed)
Follows with Dr Ernst Bowler, allergist and is doing much better since starting Fasenra. No flares or hospitalizations

## 2017-06-20 NOTE — Assessment & Plan Note (Signed)
- 

## 2017-06-20 NOTE — Assessment & Plan Note (Signed)
Ultrasound unremarkable, showed some small b/l nodules, borderline thyromegaly. thyroid labs largely normal. Thyroglobulin antibodies are up slightly but improving, no further work up at this time

## 2017-06-22 ENCOUNTER — Ambulatory Visit (INDEPENDENT_AMBULATORY_CARE_PROVIDER_SITE_OTHER): Payer: Commercial Managed Care - PPO | Admitting: *Deleted

## 2017-06-22 DIAGNOSIS — J4550 Severe persistent asthma, uncomplicated: Secondary | ICD-10-CM | POA: Diagnosis not present

## 2017-06-22 MED ORDER — BENRALIZUMAB 30 MG/ML ~~LOC~~ SOSY
30.0000 mg | PREFILLED_SYRINGE | SUBCUTANEOUS | Status: DC
  Administered 2017-06-22 – 2018-11-14 (×7): 30 mg via SUBCUTANEOUS

## 2017-07-05 DIAGNOSIS — J309 Allergic rhinitis, unspecified: Secondary | ICD-10-CM | POA: Diagnosis not present

## 2017-07-05 DIAGNOSIS — J029 Acute pharyngitis, unspecified: Secondary | ICD-10-CM | POA: Diagnosis not present

## 2017-08-01 ENCOUNTER — Encounter: Payer: Self-pay | Admitting: Family Medicine

## 2017-08-08 ENCOUNTER — Encounter: Payer: Self-pay | Admitting: Family Medicine

## 2017-08-09 MED ORDER — ALPRAZOLAM 0.25 MG PO TABS: 0.2500 mg | ORAL_TABLET | Freq: Two times a day (BID) | ORAL | 1 refills | Status: DC | PRN

## 2017-08-11 ENCOUNTER — Telehealth: Payer: Self-pay | Admitting: Family Medicine

## 2017-08-11 ENCOUNTER — Other Ambulatory Visit: Payer: Self-pay | Admitting: Family Medicine

## 2017-08-11 MED ORDER — ALPRAZOLAM 0.25 MG PO TABS: 0.2500 mg | ORAL_TABLET | Freq: Two times a day (BID) | ORAL | 1 refills | Status: DC | PRN

## 2017-08-11 NOTE — Telephone Encounter (Signed)
Copied from [place] [phone]. Topic: Quick Communication - See Telephone Encounter >> Aug 11, 2017  3:11 PM Meagan Chung wrote: CRM for notification. See Telephone encounter for: 08/11/17.  Left voicemail appt needs to be rescheduled per pcp.

## 2017-08-12 ENCOUNTER — Ambulatory Visit: Payer: Commercial Managed Care - PPO | Admitting: Allergy & Immunology

## 2017-08-18 ENCOUNTER — Ambulatory Visit (INDEPENDENT_AMBULATORY_CARE_PROVIDER_SITE_OTHER): Payer: Commercial Managed Care - PPO | Admitting: *Deleted

## 2017-08-18 DIAGNOSIS — J4550 Severe persistent asthma, uncomplicated: Secondary | ICD-10-CM

## 2017-08-26 DIAGNOSIS — Z6829 Body mass index (BMI) 29.0-29.9, adult: Secondary | ICD-10-CM | POA: Diagnosis not present

## 2017-08-26 DIAGNOSIS — Z8042 Family history of malignant neoplasm of prostate: Secondary | ICD-10-CM | POA: Diagnosis not present

## 2017-08-26 DIAGNOSIS — Z01419 Encounter for gynecological examination (general) (routine) without abnormal findings: Secondary | ICD-10-CM | POA: Diagnosis not present

## 2017-08-26 DIAGNOSIS — N390 Urinary tract infection, site not specified: Secondary | ICD-10-CM | POA: Diagnosis not present

## 2017-08-26 DIAGNOSIS — Z803 Family history of malignant neoplasm of breast: Secondary | ICD-10-CM | POA: Diagnosis not present

## 2017-08-29 DIAGNOSIS — Z630 Problems in relationship with spouse or partner: Secondary | ICD-10-CM | POA: Diagnosis not present

## 2017-09-07 DIAGNOSIS — Z630 Problems in relationship with spouse or partner: Secondary | ICD-10-CM | POA: Diagnosis not present

## 2017-09-08 DIAGNOSIS — Z630 Problems in relationship with spouse or partner: Secondary | ICD-10-CM | POA: Diagnosis not present

## 2017-09-22 ENCOUNTER — Ambulatory Visit: Payer: Commercial Managed Care - PPO | Admitting: Family Medicine

## 2017-09-27 ENCOUNTER — Ambulatory Visit: Payer: Commercial Managed Care - PPO | Admitting: Family Medicine

## 2017-09-29 DIAGNOSIS — R87612 Low grade squamous intraepithelial lesion on cytologic smear of cervix (LGSIL): Secondary | ICD-10-CM | POA: Diagnosis not present

## 2017-09-29 DIAGNOSIS — N870 Mild cervical dysplasia: Secondary | ICD-10-CM | POA: Diagnosis not present

## 2017-09-29 HISTORY — PX: COLPOSCOPY: SHX161

## 2017-10-11 ENCOUNTER — Ambulatory Visit (INDEPENDENT_AMBULATORY_CARE_PROVIDER_SITE_OTHER): Payer: Commercial Managed Care - PPO

## 2017-10-11 DIAGNOSIS — J4550 Severe persistent asthma, uncomplicated: Secondary | ICD-10-CM

## 2017-10-13 ENCOUNTER — Ambulatory Visit: Payer: Self-pay

## 2017-10-14 DIAGNOSIS — Z803 Family history of malignant neoplasm of breast: Secondary | ICD-10-CM | POA: Diagnosis not present

## 2017-10-17 ENCOUNTER — Ambulatory Visit: Payer: Commercial Managed Care - PPO | Admitting: Family Medicine

## 2017-10-18 DIAGNOSIS — R52 Pain, unspecified: Secondary | ICD-10-CM | POA: Diagnosis not present

## 2017-10-18 DIAGNOSIS — M7711 Lateral epicondylitis, right elbow: Secondary | ICD-10-CM | POA: Diagnosis not present

## 2017-10-21 ENCOUNTER — Encounter: Payer: Self-pay | Admitting: Allergy & Immunology

## 2017-10-21 ENCOUNTER — Ambulatory Visit (INDEPENDENT_AMBULATORY_CARE_PROVIDER_SITE_OTHER): Payer: Commercial Managed Care - PPO | Admitting: Allergy & Immunology

## 2017-10-21 VITALS — BP 102/62 | HR 80 | Temp 97.6°F | Resp 16 | Ht 66.0 in | Wt 167.0 lb

## 2017-10-21 DIAGNOSIS — J4540 Moderate persistent asthma, uncomplicated: Secondary | ICD-10-CM | POA: Diagnosis not present

## 2017-10-21 DIAGNOSIS — J3089 Other allergic rhinitis: Secondary | ICD-10-CM | POA: Diagnosis not present

## 2017-10-21 DIAGNOSIS — J302 Other seasonal allergic rhinitis: Secondary | ICD-10-CM

## 2017-10-21 NOTE — Progress Notes (Signed)
FOLLOW UP  Date of Service/Encounter:  10/21/17   Assessment:   Severe persistent asthma - stable on Fasenra  Recurrent infections - with normal screening  Seasonal and perennial allergic rhinitis (trees, weeds, grasses, indoor molds, dust mites, cat and dog)  Chronic urticaria - resolved   Asthma Reportables:  Severity: severe persistent  Risk: high Control: well controlled    Meagan Chung is a lovely 32 y.o. female with a history of steroid dependent severe persistent asthma presenting for a follow up appointment. She has done remarkably well with Berna Bue therapy and in fact she has even stopped using her controller medications completely. She has not needed her rescue inhaler in several weeks and has been off of steroids for several months at this point. We did have a discussion and I emphasized that Berna Bue is not meant to be a monotherapy, but she tells me that she is not going to be taking her controller inhalers since she is doing so well. Therefore, we discussed indications for using these again and I emphasized asthmatic symptoms to watch for as a signal to restart her inhalers. She is in agreement with the plan.    Plan/Recommendations:   1. Severe persistent asthma without complication - Lung function looked great today - We will not make any medication changes aet this time.   - We did have a discussion about how Berna Bue is not meant to be a monotherapy for asthma, but rather an adjunct treatment in conjunction with inhalers. - However, she is not interested in using inhalers at all since she feels so well on the current plan.  - Daily controller medication(s): Fasenra every 8 weeks subcutaneously - Prior to physical activity: ProAir 2 puffs 10-15 minutes before physical activity. - Rescue medications: ProAir 4 puffs every 4-6 hours as needed - Asthma control goals:  * Full participation in all desired activities (may need albuterol before activity) * Albuterol use  two time or less a week on average (not counting use with activity) * Cough interfering with sleep two time or less a month * Oral steroids no more than once a year * No hospitalizations  2. Perennial and seasonal allergic rhinitis (trees, weeds, grasses, indoor molds, dust mites, cat and dog) - Continue with taking: Zyrtec (cetirizine) 10mg  tablet once daily as needed - You can use an extra dose of the antihistamine, if needed, for breakthrough symptoms.  3. Return in about 6 months (around 04/23/2018).  Subjective:   Meagan Chung is a 32 y.o. female presenting today for follow up of  Chief Complaint  Patient presents with  . Asthma    Meagan Chung has a history of the following: Patient Active Problem List   Diagnosis Date Noted  . Recurrent infections 04/08/2017  . Seasonal and perennial allergic rhinitis 04/08/2017  . Chronic asthma without complication 35/00/9381  . Anxiety and depression   . Thyroid nodule   . Esophageal reflux   . Hypokalemia   . Asthma 11/10/2014  . Irritable bowel syndrome 12/19/2012  . Insomnia 12/19/2012  . History of bulimia 12/19/2012  . 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 Hashimoto's thyroiditis 01/06/2011    History obtained from: chart review and .  Juanda Bond [facility] Primary Care Provider is Mosie Lukes, MD.     Meagan Chung is a 32 y.o. female presenting for a follow up visit. She was last seen in March 2019. At that time, she was doing very well on the [place]. We kept  her on Singulair as well as high dose Advair at that time as well. Her allergic rhinitis was controlled with cetirizine 10mg  daily as needed. Despite her sensitizations, she was not having any ocular or nasal symptoms whatsoever. She was very pleased with the Fasenra at that time.   Since the last visit, Meagan Chung has done very well. In fact, she has stoppe dher Singulair and Advair completely. Meagan Chung's asthma has been well controlled. She has not required rescue medication,  experienced nocturnal awakenings due to lower respiratory symptoms, nor have activities of daily living been limited. She has required no Emergency Department or Urgent Care visits for her asthma. She has required zero courses of systemic steroids for asthma exacerbations since the last visit. ACT score today is 25, indicating excellent asthma symptom control. Rhinitis has not been a problem at all, and she has not used her cetirizine in months.   She was having issues with thyroiditis in the past and there was always a suspcioin for a Hashimoto's; this was confirmed with labs. She has had an ultrasound that showed a few more nodules this year, but her PCP is not going to do anything else with this since her labs are stable. She does not follow regularly with an endocrinologist.   Otherwise, there have been no changes to her past medical history, surgical history, family history, or social history. 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     Review of Systems: a 14-point review of systems is pertinent for what is mentioned in HPI.  Otherwise, all other systems were negative. Constitutional: negative other than that listed in the HPI Eyes: negative other than that listed in the HPI Ears, nose, mouth, throat, and face: negative other than that listed in the HPI Respiratory: negative other than that listed in the HPI Cardiovascular: negative other than that listed in the HPI Gastrointestinal: negative other than that listed in the HPI Genitourinary: negative other than that listed in the HPI Integument: negative other than that listed in the HPI Hematologic: negative other than that listed in the HPI Musculoskeletal: negative other than that listed in the HPI Neurological: negative other than that listed in the HPI Allergy/Immunologic: negative other than that listed in the HPI    Objective:   Blood pressure 102/62, pulse 80, temperature 97.6 F (36.4  C), temperature source Oral, resp. rate 16, height 5\' 6"  (1.676 m), weight 167 lb (75.8 kg), SpO2 96 %, not currently breastfeeding. Body mass index is 26.95 kg/m.   Physical Exam:  General: Alert, interactive, in no acute distress. In good spirits today.  Eyes: No conjunctival injection bilaterally, no discharge on the right, no discharge on the left and no Horner-Trantas dots present. PERRL bilaterally. EOMI without pain. No photophobia.  Ears: Right TM pearly gray with normal light reflex, Left TM pearly gray with normal light reflex, Right TM intact without perforation and Left TM intact without perforation.  Nose/Throat: External nose within normal limits and septum midline. Turbinates edematous and pale with thick discharge. Posterior oropharynx moderately erythematous with cobblestoning in the posterior oropharynx. Tonsils 2+ without exudates.  Tongue without thrush. Lungs: Clear to auscultation without wheezing, rhonchi or rales. No increased work of breathing. CV: Normal S1/S2. No murmurs. Capillary refill <2 seconds.  Skin: Warm and dry, without lesions or rashes. Neuro:   Grossly intact. No focal deficits appreciated. Responsive to questions.  Diagnostic studies:   Spirometry: results normal (FEV1: 3.95/118%, FVC: 4.97/123%, FEV1/FVC:  79%).    Spirometry consistent with normal pattern.   Allergy Studies: none      Salvatore Marvel, MD  [facility] [place]

## 2017-10-21 NOTE — Patient Instructions (Addendum)
1. Severe persistent asthma without complication - Lung function looked great today - We will not make any medication changes aet this time.   - Daily controller medication(s): Fasenra every 8 weeks IM - Prior to physical activity: ProAir 2 puffs 10-15 minutes before physical activity. - Rescue medications: ProAir 4 puffs every 4-6 hours as needed - Asthma control goals:  * Full participation in all desired activities (may need albuterol before activity) * Albuterol use two time or less a week on average (not counting use with activity) * Cough interfering with sleep two time or less a month * Oral steroids no more than once a year * No hospitalizations  2. Perennial and seasonal allergic rhinitis (trees, weeds, grasses, indoor molds, dust mites, cat and dog) - Continue with taking: Zyrtec (cetirizine) 10mg  tablet once daily as needed - You can use an extra dose of the antihistamine, if needed, for breakthrough symptoms.  3. Return in about 6 months (around 04/23/2018).   Please inform us of any Emergency Department visits, hospitalizations, or changes in symptoms. Call us before going to the ED for breathing or allergy symptoms since we might be able to fit you in for a sick visit. Feel free to contact us anytime with any questions, problems, or concerns.  It was a pleasure to see you again today!  Websites that have reliable patient information: 1. American Academy of Asthma, Allergy, and Immunology: www.aaaai.org 2. Food Allergy Research and Education (FARE): foodallergy.org 3. Mothers of Asthmatics: http://www.asthmacommunitynetwork.org 4. American College of Allergy, Asthma, and Immunology: www.acaai.org

## 2017-10-24 ENCOUNTER — Ambulatory Visit (INDEPENDENT_AMBULATORY_CARE_PROVIDER_SITE_OTHER): Payer: Commercial Managed Care - PPO | Admitting: Family Medicine

## 2017-10-24 ENCOUNTER — Encounter: Payer: Self-pay | Admitting: Family Medicine

## 2017-10-24 VITALS — BP 104/61 | HR 73 | Temp 98.5°F | Ht 66.0 in | Wt 167.8 lb

## 2017-10-24 DIAGNOSIS — F329 Major depressive disorder, single episode, unspecified: Secondary | ICD-10-CM

## 2017-10-24 DIAGNOSIS — K219 Gastro-esophageal reflux disease without esophagitis: Secondary | ICD-10-CM

## 2017-10-24 DIAGNOSIS — E041 Nontoxic single thyroid nodule: Secondary | ICD-10-CM | POA: Diagnosis not present

## 2017-10-24 DIAGNOSIS — Z0000 Encounter for general adult medical examination without abnormal findings: Secondary | ICD-10-CM | POA: Diagnosis not present

## 2017-10-24 DIAGNOSIS — F32A Depression, unspecified: Secondary | ICD-10-CM

## 2017-10-24 DIAGNOSIS — F419 Anxiety disorder, unspecified: Secondary | ICD-10-CM | POA: Diagnosis not present

## 2017-10-24 DIAGNOSIS — J45909 Unspecified asthma, uncomplicated: Secondary | ICD-10-CM | POA: Diagnosis not present

## 2017-10-24 DIAGNOSIS — Z79899 Other long term (current) drug therapy: Secondary | ICD-10-CM

## 2017-10-24 LAB — LIPID PANEL
CHOL/HDL RATIO: 4
Cholesterol: 167 mg/dL (ref 0–200)
HDL: 43.5 mg/dL (ref >39.00)
LDL Cholesterol: 112 mg/dL — ABNORMAL HIGH (ref 0–99)
NONHDL: 123.58
TRIGLYCERIDES: 57 mg/dL (ref 0.0–149.0)
VLDL: 11.4 mg/dL (ref 0.0–40.0)

## 2017-10-24 LAB — COMPREHENSIVE METABOLIC PANEL
ALT: 12 U/L (ref 0–35)
AST: 12 U/L (ref 0–37)
Albumin: 4.3 g/dL (ref 3.5–5.2)
Alkaline Phosphatase: 68 U/L (ref 39–117)
BILIRUBIN TOTAL: 0.6 mg/dL (ref 0.2–1.2)
BUN: 13 mg/dL (ref 6–23)
CALCIUM: 9.5 mg/dL (ref 8.4–10.5)
CO2: 27 meq/L (ref 19–32)
Chloride: 106 mEq/L (ref 96–112)
Creatinine, Ser: 0.83 mg/dL (ref 0.40–1.20)
GFR: 84 mL/min (ref >60.00)
GLUCOSE: 83 mg/dL (ref 70–99)
Potassium: 4.1 mEq/L (ref 3.5–5.1)
Sodium: 141 mEq/L (ref 135–145)
Total Protein: 6.8 g/dL (ref 6.0–8.3)

## 2017-10-24 MED ORDER — ALPRAZOLAM 0.25 MG PO TABS: 0.2500 mg | ORAL_TABLET | Freq: Two times a day (BID) | ORAL | 3 refills | Status: DC | PRN

## 2017-10-24 NOTE — Assessment & Plan Note (Signed)
TSH normal on last

## 2017-10-24 NOTE — Assessment & Plan Note (Signed)
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

## 2017-10-24 NOTE — Patient Instructions (Signed)

## 2017-10-24 NOTE — Addendum Note (Signed)
Addended by: Magdalene Molly A on: 10/24/2017 09:09 AM   Modules accepted: Orders

## 2017-10-24 NOTE — Progress Notes (Signed)
Subjective:    Patient ID: Meagan Chung, female    DOB: May 16, 1985, 32 y.o.   MRN: [id number]  Chief Complaint  Patient presents with  . Follow-up    Pt here for 3 month f/u visit. Will need form filled out for insurance.   . Medication Refill    Request refill on alprazolam.     HPI Patient is in today for follow up. She is undergoing a messy divorce and is under a great deal a stress with a 28 month old daughter at home who does not sleep through the night. She manages well but she does need a refill of her Alprazolam. Denies CP/palp/SOB/HA/congestion/fevers/GI or GU c/o. Taking meds as prescribed  Past Medical History:  Diagnosis Date  . Anxiety and depression    anxiety, bulemia in past all well controlled   . Asthma   . Depression    anxiety, bulemia in past all well controlled  . Diarrhea 11/29/2014  . Hyperlipidemia, mixed 02/02/2015  . Thyroid nodule     Past Surgical History:  Procedure Laterality Date  . ANKLE SURGERY    . COLPOSCOPY  09/2017  . WISDOM TOOTH EXTRACTION Bilateral     Family History  Problem Relation Age of Onset  . Stroke Father        2  . Hyperlipidemia Father   . Allergic rhinitis Father   . Anorexia nervosa Sister        38  . Kidney Stones Sister   . Thyroid disease Brother   . Obesity Brother   . Asthma Brother   . Dementia Maternal Grandmother   . Osteoporosis Maternal Grandmother   . Cancer Maternal Grandmother        breast  . Cancer Maternal Grandfather        prostate  . Alcohol abuse Maternal Grandfather   . Cancer Paternal Grandmother 84       3 masses in pancreas and thyroid, suspect cancer  . Hyperlipidemia Paternal Grandmother   . Eczema Mother   . Urticaria Mother   . Immunodeficiency Neg Hx   . Angioedema Neg Hx     Social History   Socioeconomic History  . Marital status: Married    Spouse name: Not on file  . Number of children: 0  . Years of education: Not on file  . Highest education level: Not on  file  Occupational History  . Occupation: development  Social Needs  . Financial resource strain: Not on file  . Food insecurity:    Worry: Not on file    Inability: Not on file  . Transportation needs:    Medical: Not on file    Non-medical: Not on file  Tobacco Use  . Smoking status: Never Smoker  . Smokeless tobacco: Never Used  Substance and Sexual Activity  . Alcohol use: Yes    Alcohol/week: 0.0 standard drinks    Comment: rarely  . Drug use: No  . Sexual activity: Yes    Comment: lives with fiance, fund raise for [employer], avoids gluten, minimize dairy,  Lifestyle  . Physical activity:    Days per week: Not on file    Minutes per session: Not on file  . Stress: Not on file  Relationships  . Social connections:    Talks on phone: Not on file    Gets together: Not on file    Attends religious service: Not on file    Active member of club or organization:  Not on file    Attends meetings of clubs or organizations: Not on file    Relationship status: Not on file  . Intimate partner violence:    Fear of current or ex partner: Not on file    Emotionally abused: Not on file    Physically abused: Not on file    Forced sexual activity: Not on file  Other Topics Concern  . Not on file  Social History Narrative  . Not on file    Outpatient Medications Prior to Visit  Medication Sig Dispense Refill  . albuterol (PROVENTIL) (2.5 MG/3ML) 0.083% nebulizer solution Take 3 mLs (2.5 mg total) by nebulization every 6 (six) hours as needed for wheezing or shortness of breath. 150 mL 1  . albuterol (VENTOLIN HFA) 108 (90 Base) MCG/ACT inhaler Inhale 2 puffs into the lungs every 6 (six) hours as needed for wheezing or shortness of breath. 1 Inhaler 3  . Benralizumab (FASENRA) 30 MG/ML SOSY Inject into the skin.    Marland Kitchen sertraline (ZOLOFT) 100 MG tablet Take 150 mg by mouth daily.    Marland Kitchen ALPRAZolam (XANAX) 0.25 MG tablet Take 1 tablet (0.25 mg total) by mouth 2 (two) times  daily as needed for anxiety. 20 tablet 1   Facility-Administered Medications Prior to Visit  Medication Dose Route Frequency Provider Last Rate Last Dose  . Benralizumab SOSY 30 mg  30 mg Subcutaneous Q8 Toney Reil, MD   30 mg at 10/11/17 1421    Allergies  Allergen Reactions  . Other Cough    Capers causes SOB Cats 4+    Review of Systems  Constitutional: Negative for fever and malaise/fatigue.  HENT: Negative for congestion.   Eyes: Negative for blurred vision.  Respiratory: Negative for shortness of breath.   Cardiovascular: Negative for chest pain, palpitations and leg swelling.  Gastrointestinal: Negative for abdominal pain, blood in stool and nausea.  Genitourinary: Negative for dysuria and frequency.  Musculoskeletal: Negative for falls.  Skin: Negative for rash.  Neurological: Negative for dizziness, loss of consciousness and headaches.  Endo/Heme/Allergies: Negative for environmental allergies.  Psychiatric/Behavioral: Negative for depression. The patient is nervous/anxious.        Objective:    Physical Exam  Constitutional: She is oriented to person, place, and time. She appears well-developed and well-nourished. No distress.  HENT:  Head: Normocephalic and atraumatic.  Nose: Nose normal.  Eyes: Right eye exhibits no discharge. Left eye exhibits no discharge.  Neck: Normal range of motion. Neck supple.  Cardiovascular: Normal rate and regular rhythm.  No murmur heard. Pulmonary/Chest: Effort normal and breath sounds normal.  Abdominal: Soft. Bowel sounds are normal. There is no tenderness.  Musculoskeletal: She exhibits no edema.  Neurological: She is alert and oriented to person, place, and time.  Skin: Skin is warm and dry.  Psychiatric: She has a normal mood and affect.  Nursing note and vitals reviewed.   BP 104/61 (BP Location: Right Arm, Patient Position: Sitting, Cuff Size: Large)   Pulse 73   Temp 98.5 F (36.9 C) (Oral)   Ht  5\' 6"  (1.676 m)   Wt 167 lb 12.8 oz (76.1 kg)   SpO2 98%   BMI 27.08 kg/m  Wt Readings from Last 3 Encounters:  10/24/17 167 lb 12.8 oz (76.1 kg)  10/21/17 167 lb (75.8 kg)  06/16/17 194 lb 3.2 oz (88.1 kg)     Lab Results  Component Value Date   WBC CANCELED 04/08/2017   HGB 13.3 06/14/2016  HCT 40.2 06/14/2016   PLT 280.0 06/14/2016   GLUCOSE 80 01/31/2015   CHOL 155 01/31/2015   TRIG 44.0 01/31/2015   HDL 45.90 01/31/2015   LDLCALC 100 (H) 01/31/2015   ALT 13 01/31/2015   AST 14 01/31/2015   NA 139 01/31/2015   K 4.4 01/31/2015   CL 106 01/31/2015   CREATININE 0.79 01/31/2015   BUN 13 01/31/2015   CO2 26 01/31/2015   TSH 0.87 06/16/2017   HGBA1C 5.2 01/31/2015    Lab Results  Component Value Date   TSH 0.87 06/16/2017   Lab Results  Component Value Date   WBC CANCELED 04/08/2017   HGB 13.3 06/14/2016   HCT 40.2 06/14/2016   MCV 87.3 06/14/2016   PLT 280.0 06/14/2016   Lab Results  Component Value Date   NA 139 01/31/2015   K 4.4 01/31/2015   CO2 26 01/31/2015   GLUCOSE 80 01/31/2015   BUN 13 01/31/2015   CREATININE 0.79 01/31/2015   BILITOT 0.6 01/31/2015   ALKPHOS 77 01/31/2015   AST 14 01/31/2015   ALT 13 01/31/2015   PROT 7.5 01/31/2015   ALBUMIN 4.5 01/31/2015   CALCIUM 9.4 01/31/2015   ANIONGAP 7 11/11/2014   GFR 90.49 01/31/2015   Lab Results  Component Value Date   CHOL 155 01/31/2015   Lab Results  Component Value Date   HDL 45.90 01/31/2015   Lab Results  Component Value Date   LDLCALC 100 (H) 01/31/2015   Lab Results  Component Value Date   TRIG 44.0 01/31/2015   Lab Results  Component Value Date   CHOLHDL 3 01/31/2015   Lab Results  Component Value Date   HGBA1C 5.2 01/31/2015       Assessment & Plan:   Problem List Items Addressed This Visit    Esophageal reflux    Avoid offending foods, start probiotics. Do not eat large meals in late evening and consider raising head of bed.       Relevant Orders    Comprehensive metabolic panel   Anxiety and depression    Alprazolam refilled for her. Contract and UDS done today. Is under going a messy divorce with 61 month year old female.      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 Jaquez "Anda Kraft" maintain her albuterol, albuterol, Benralizumab, sertraline, and ALPRAZolam. We will continue to administer Benralizumab.  Meds ordered this encounter  Medications  . ALPRAZolam (XANAX) 0.25 MG tablet    Sig: Take 1 tablet (0.25 mg total) by mouth 2 (two) times daily as needed for anxiety.    Dispense:  20 tablet    Refill:  3     Penni Homans, MD

## 2017-10-24 NOTE — Assessment & Plan Note (Signed)
Alprazolam refilled for her. Contract and UDS done today. Is under going a messy divorce with 20 month year old female.

## 2017-10-24 NOTE — Assessment & Plan Note (Signed)
Avoid offending foods, start probiotics. Do not eat large meals in late evening and consider raising head of bed.  

## 2017-10-26 LAB — PAIN MGMT, PROFILE 8 W/CONF, U
6 Acetylmorphine: NEGATIVE ng/mL (ref <10)
ALCOHOL METABOLITES: NEGATIVE ng/mL (ref <500)
ALPHAHYDROXYALPRAZOLAM: 108 ng/mL — AB (ref <25)
ALPHAHYDROXYMIDAZOLAM: NEGATIVE ng/mL (ref <50)
ALPHAHYDROXYTRIAZOLAM: NEGATIVE ng/mL (ref <50)
Aminoclonazepam: NEGATIVE ng/mL (ref <25)
Amphetamines: NEGATIVE ng/mL (ref <500)
BENZODIAZEPINES: POSITIVE ng/mL — AB (ref <100)
Buprenorphine, Urine: NEGATIVE ng/mL (ref <5)
COCAINE METABOLITE: NEGATIVE ng/mL (ref <150)
Creatinine: 273.3 mg/dL
Hydroxyethylflurazepam: NEGATIVE ng/mL (ref <50)
Lorazepam: NEGATIVE ng/mL (ref <50)
MARIJUANA METABOLITE: NEGATIVE ng/mL (ref <20)
MDMA: NEGATIVE ng/mL (ref <500)
Nordiazepam: NEGATIVE ng/mL (ref <50)
OXAZEPAM: NEGATIVE ng/mL (ref <50)
OXIDANT: NEGATIVE ug/mL (ref <200)
Opiates: NEGATIVE ng/mL (ref <100)
Oxycodone: NEGATIVE ng/mL (ref <100)
Temazepam: NEGATIVE ng/mL (ref <50)
pH: 5.87 (ref 4.5–9.0)

## 2017-10-27 ENCOUNTER — Telehealth: Payer: Self-pay | Admitting: Family Medicine

## 2017-10-27 ENCOUNTER — Encounter: Payer: Self-pay | Admitting: Family Medicine

## 2017-10-27 NOTE — Telephone Encounter (Signed)
Copied from [place] [id number]. Topic: Quick Communication - Lab Results >> Oct 27, 2017 10:23 AM [name], NT wrote: Patient has questions about her lab results and would like a call   back at  [phone]

## 2017-10-27 NOTE — Telephone Encounter (Signed)
Spoke with [facility] patient called needing letter explaining her Urine drug screen. Being told she has court and needs a letter stating the medical necessity for her Xanax. I did give her the number to [facility] [employer] to see if she can get a better explanation of the numbers.

## 2017-10-28 ENCOUNTER — Ambulatory Visit: Payer: Commercial Managed Care - PPO | Admitting: Family Medicine

## 2017-10-28 NOTE — Telephone Encounter (Signed)
Patient called to follow up to see if documentation was sent to her medical insurance after her cpe.  She said that the fax number was on the bottom of the form. Also the patient mentioned that she tested positve on the urine screen for Xanax.  She has a custudy hearing and would like to know if Dr. Charlett Blake can write a letter stating that this postive results is for xanax not for drugs.  She did reach out to [employer] however, they said that they are not able talk to her.  They are only able to talk to Drs on that number.    Please advise. Patients call back number is [phone]

## 2017-10-28 NOTE — Telephone Encounter (Signed)
I wrote the letter last night, it should have printed and gone to her mychart. Please print and communicate with her.  Also complete her form

## 2017-11-01 NOTE — Telephone Encounter (Signed)
Patient has been advised about letter ready for pick up along with other paperwork

## 2017-12-06 ENCOUNTER — Ambulatory Visit (INDEPENDENT_AMBULATORY_CARE_PROVIDER_SITE_OTHER): Payer: Commercial Managed Care - PPO

## 2017-12-06 ENCOUNTER — Other Ambulatory Visit: Payer: Self-pay

## 2017-12-06 ENCOUNTER — Telehealth: Payer: Self-pay

## 2017-12-06 DIAGNOSIS — J4540 Moderate persistent asthma, uncomplicated: Secondary | ICD-10-CM

## 2017-12-06 DIAGNOSIS — J4550 Severe persistent asthma, uncomplicated: Secondary | ICD-10-CM

## 2017-12-06 MED ORDER — EPINEPHRINE 0.3 MG/0.3ML IJ SOAJ: 0.3000 mg | Freq: Once | INTRAMUSCULAR | 1 refills | Status: AC

## 2017-12-06 NOTE — Telephone Encounter (Signed)
I will be happy to write that letter and fax it to the [facility] office.   Salvatore Marvel, MD [facility]

## 2017-12-06 NOTE — Telephone Encounter (Signed)
Lm for pt to pick up letter any time that it is up front

## 2017-12-06 NOTE — Telephone Encounter (Signed)
Ok once I have it ill call the pt

## 2017-12-06 NOTE — Telephone Encounter (Signed)
Pt came in for fasenra and stated she is in middle of a custody battle with her ex husband and was wondering if you could type out a letter stating that she only takes prednisone when needed for flare ups. Her ex husband is a avid steroid user to gain bulk so if it comes into question she would like a letter from you stating it sonly when needed.

## 2017-12-13 ENCOUNTER — Telehealth: Payer: Self-pay | Admitting: Allergy

## 2017-12-13 NOTE — Telephone Encounter (Signed)
Left message for patient ro call [employer] regarding the [place] Phone [phone]

## 2017-12-21 DIAGNOSIS — Z23 Encounter for immunization: Secondary | ICD-10-CM | POA: Diagnosis not present

## 2017-12-26 DIAGNOSIS — Z1231 Encounter for screening mammogram for malignant neoplasm of breast: Secondary | ICD-10-CM | POA: Diagnosis not present

## 2018-01-31 ENCOUNTER — Ambulatory Visit (INDEPENDENT_AMBULATORY_CARE_PROVIDER_SITE_OTHER): Payer: Commercial Managed Care - PPO

## 2018-01-31 DIAGNOSIS — J4550 Severe persistent asthma, uncomplicated: Secondary | ICD-10-CM

## 2018-02-09 ENCOUNTER — Other Ambulatory Visit: Payer: Self-pay | Admitting: Family Medicine

## 2018-02-09 DIAGNOSIS — F329 Major depressive disorder, single episode, unspecified: Secondary | ICD-10-CM

## 2018-02-09 DIAGNOSIS — F32A Depression, unspecified: Secondary | ICD-10-CM

## 2018-02-09 DIAGNOSIS — F419 Anxiety disorder, unspecified: Secondary | ICD-10-CM

## 2018-02-10 ENCOUNTER — Telehealth: Payer: Self-pay

## 2018-02-10 NOTE — Telephone Encounter (Signed)
Requesting:Xanax Contract:yes UDS:low risk next screen 04/26/18 Last OV:10/24/17 Next OV:04/20/18 Last Refill:10/24/17 #[id number] Database:   Please advise '

## 2018-02-10 NOTE — Telephone Encounter (Signed)
PA approved.   Request Reference Number: [id number]. ARMOUR THYRO TAB 15MG  is approved through 02/11/2019. For further questions, call [phone]

## 2018-02-10 NOTE — Telephone Encounter (Signed)
PA initiated via Covermymeds; KEY: [id number]. Awaiting determination.

## 2018-02-24 IMAGING — DX DG CHEST 2V
2 series · 2 of 2 positions shown · non-contrast
Comparison: 01/31/2015 and earlier chest radiographs.

CLINICAL DATA: 31-year-old female with shortness of Breath. History
of asthma. Recently on prednisone.

EXAM:
CHEST  2 VIEW

[chest pa]
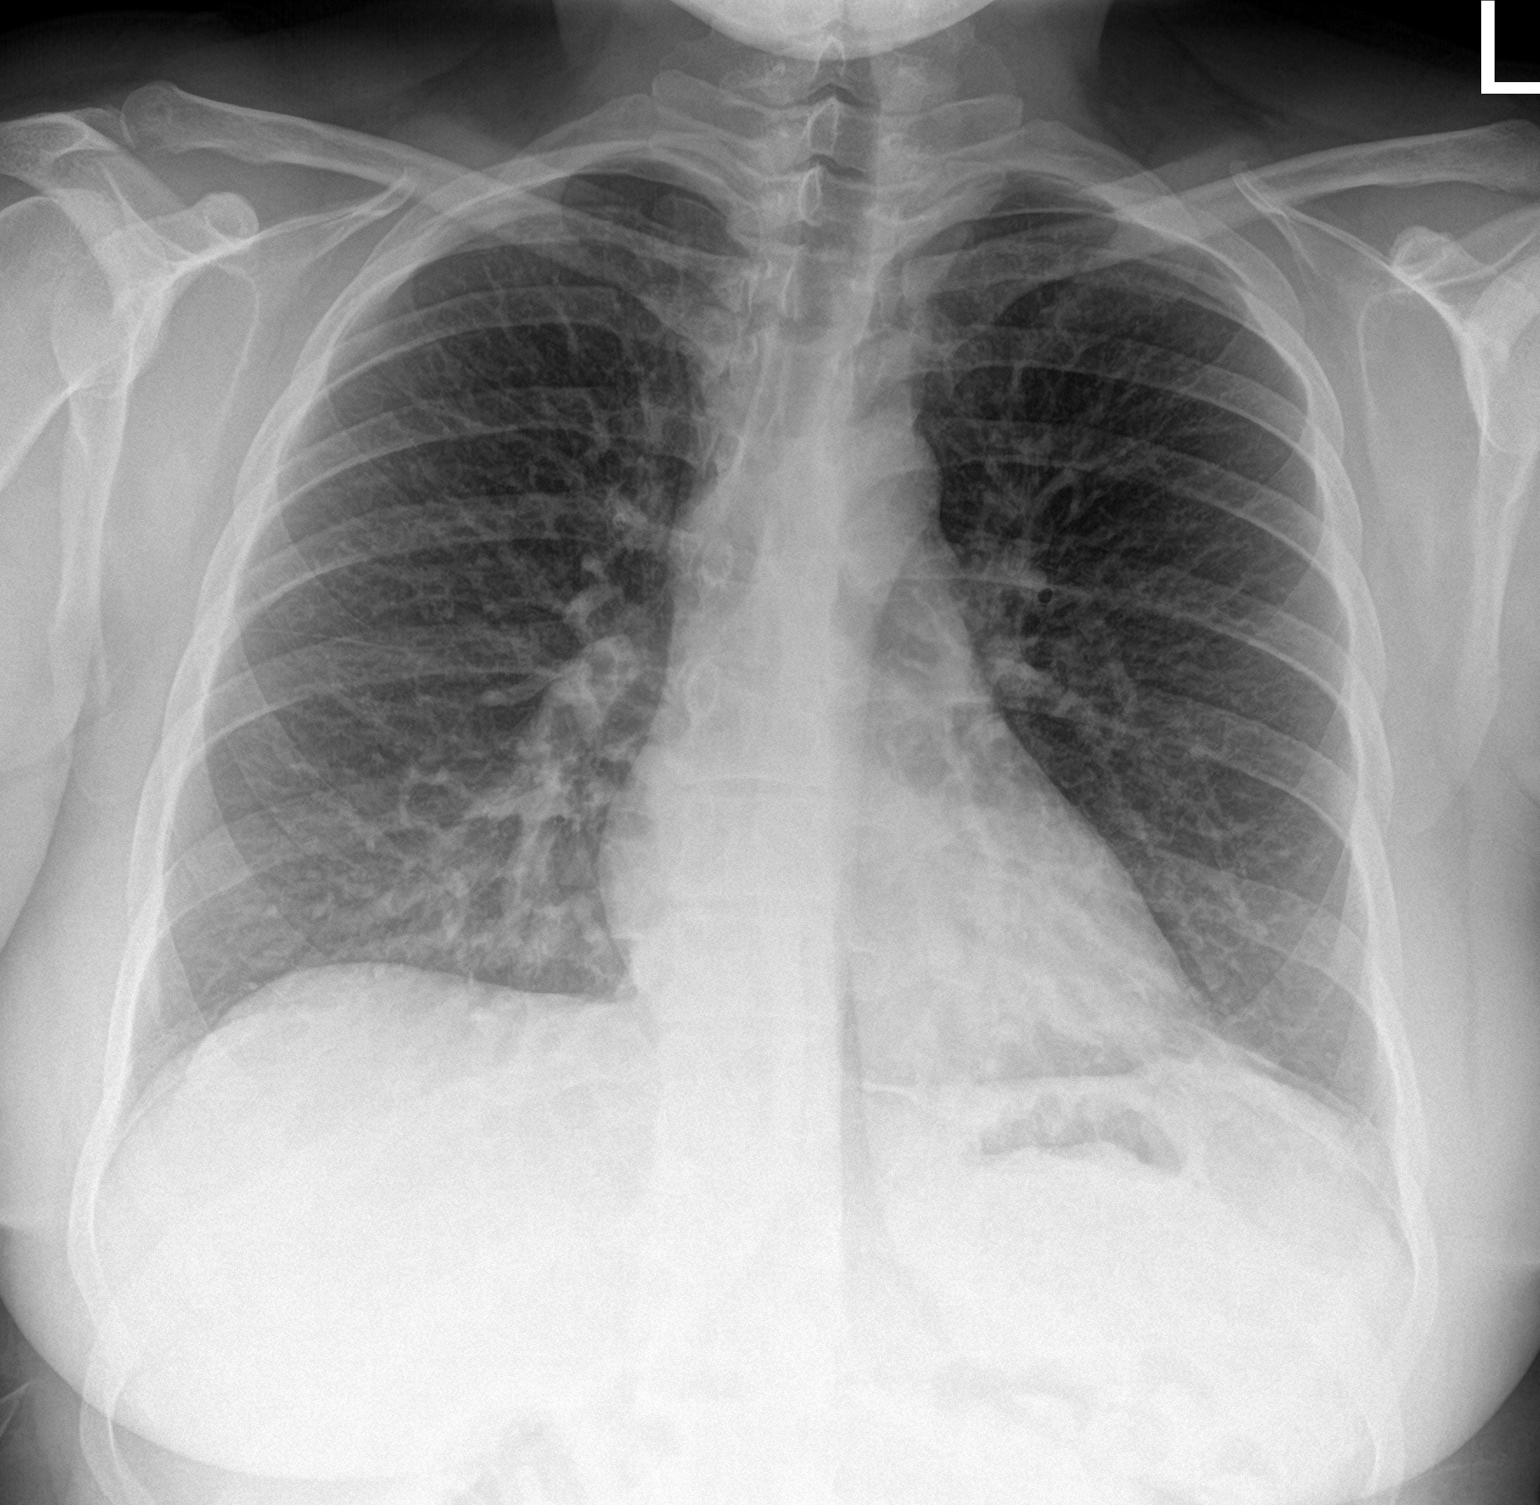

[chest lat]
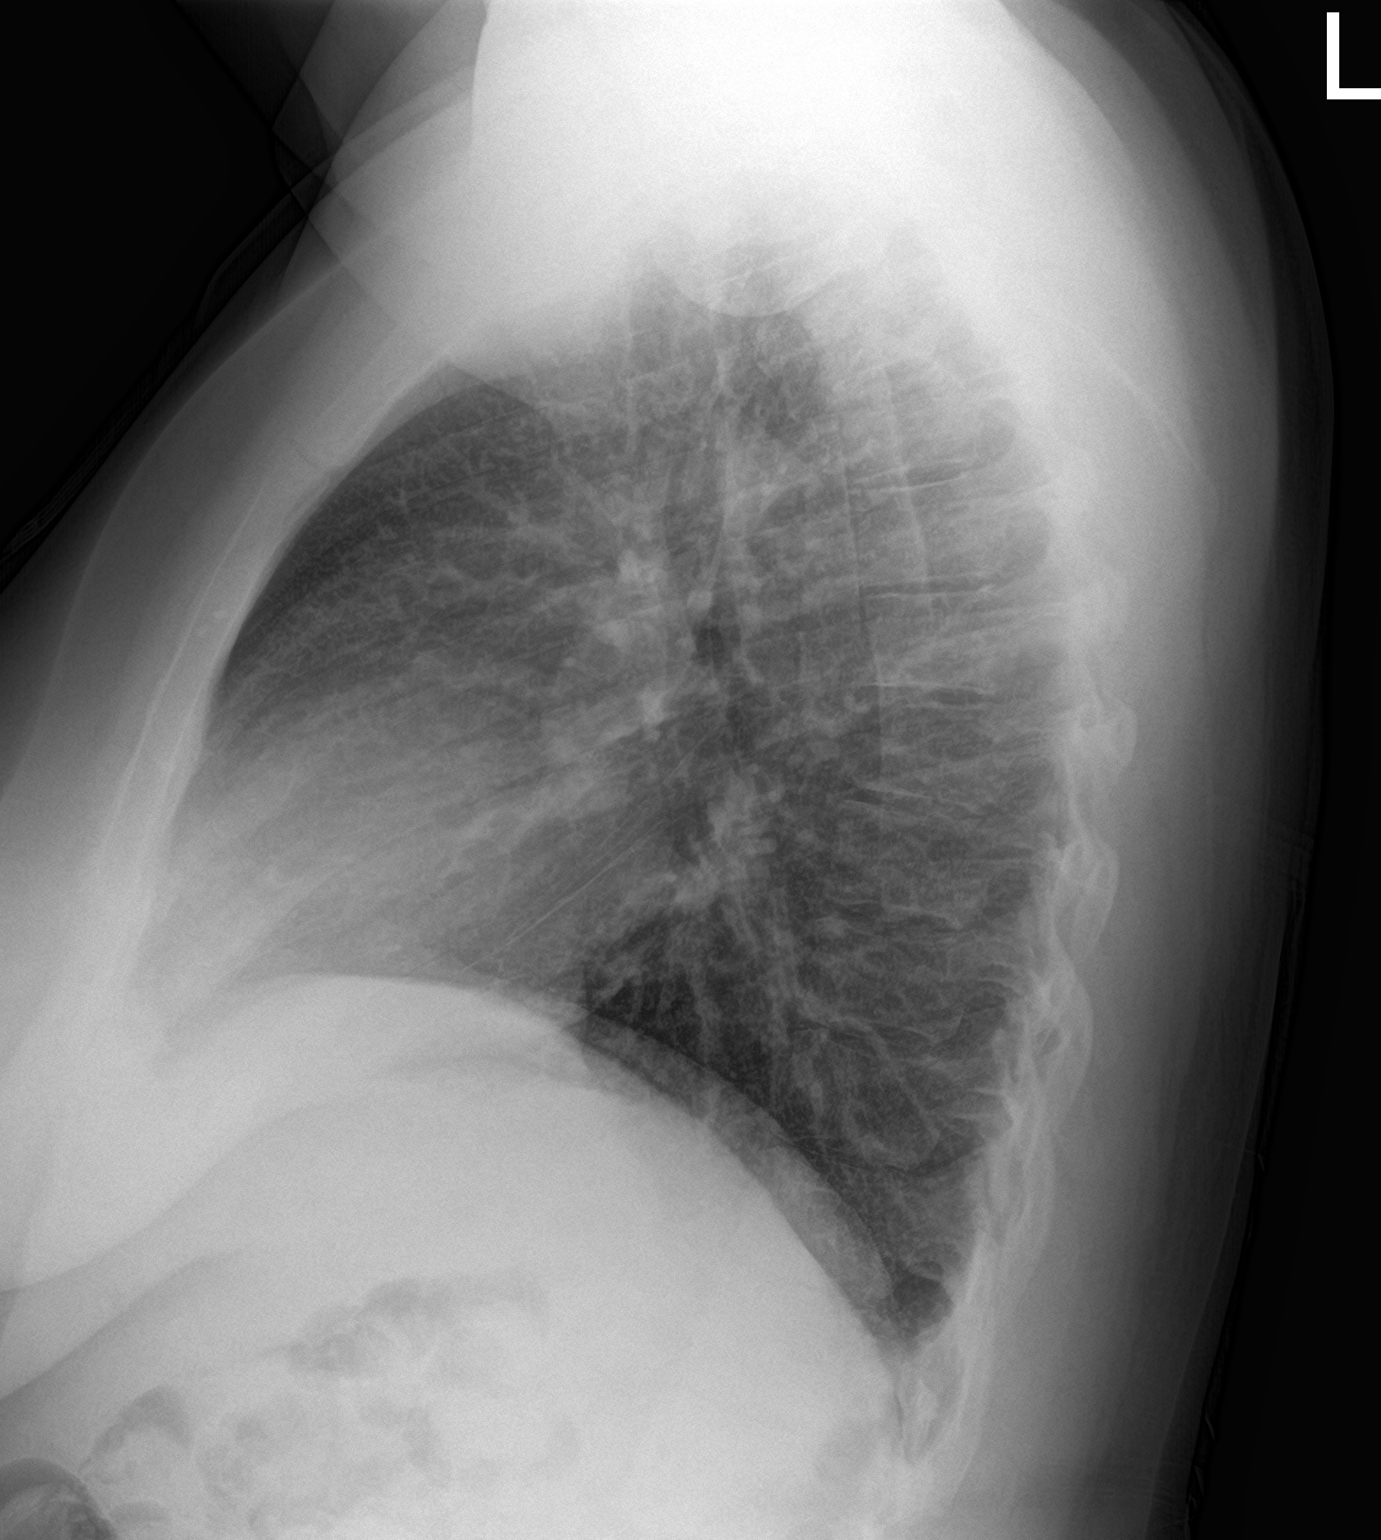

[2 of 2 positions shown; findings below may reference images not displayed]

FINDINGS: Mild bilateral increased pulmonary interstitial markings appear
similar to that on 01/31/2015. Lung volumes are stable. No
pneumothorax, pleural effusion or confluent pulmonary opacity.
Normal cardiac size and mediastinal contours. Visualized tracheal
air column is within normal limits. No acute osseous abnormality
identified. Negative visible bowel gas pattern.
IMPRESSION: Mildly increased nonspecific pulmonary interstitial markings,
similar to the 6590 comparison. Chronic versus reactive airway
changes are favored over acute viral/atypical respiratory infection.

## 2018-03-28 ENCOUNTER — Ambulatory Visit (INDEPENDENT_AMBULATORY_CARE_PROVIDER_SITE_OTHER): Payer: Commercial Managed Care - PPO

## 2018-03-28 DIAGNOSIS — J4540 Moderate persistent asthma, uncomplicated: Secondary | ICD-10-CM

## 2018-03-28 DIAGNOSIS — J4550 Severe persistent asthma, uncomplicated: Secondary | ICD-10-CM | POA: Diagnosis not present

## 2018-04-03 ENCOUNTER — Ambulatory Visit: Payer: Self-pay

## 2018-04-20 ENCOUNTER — Encounter: Payer: Commercial Managed Care - PPO | Admitting: Family Medicine

## 2018-04-24 DIAGNOSIS — J029 Acute pharyngitis, unspecified: Secondary | ICD-10-CM | POA: Diagnosis not present

## 2018-04-25 ENCOUNTER — Other Ambulatory Visit: Payer: Self-pay | Admitting: Allergy & Immunology

## 2018-04-25 NOTE — Telephone Encounter (Signed)
Please advise 

## 2018-04-28 ENCOUNTER — Ambulatory Visit: Payer: Commercial Managed Care - PPO | Admitting: Allergy & Immunology

## 2018-05-02 ENCOUNTER — Ambulatory Visit: Payer: Commercial Managed Care - PPO | Admitting: Allergy & Immunology

## 2018-05-12 ENCOUNTER — Other Ambulatory Visit: Payer: Self-pay | Admitting: *Deleted

## 2018-05-12 MED ORDER — BENRALIZUMAB 30 MG/ML ~~LOC~~ SOSY: 30.0000 mg | PREFILLED_SYRINGE | SUBCUTANEOUS | 8 refills | Status: DC

## 2018-05-13 ENCOUNTER — Other Ambulatory Visit: Payer: Self-pay | Admitting: Family Medicine

## 2018-05-13 DIAGNOSIS — F329 Major depressive disorder, single episode, unspecified: Secondary | ICD-10-CM

## 2018-05-13 DIAGNOSIS — F32A Depression, unspecified: Secondary | ICD-10-CM

## 2018-05-13 DIAGNOSIS — F419 Anxiety disorder, unspecified: Secondary | ICD-10-CM

## 2018-05-15 ENCOUNTER — Other Ambulatory Visit: Payer: Self-pay

## 2018-05-15 ENCOUNTER — Encounter: Payer: Self-pay | Admitting: Family Medicine

## 2018-05-15 ENCOUNTER — Ambulatory Visit (INDEPENDENT_AMBULATORY_CARE_PROVIDER_SITE_OTHER): Payer: Commercial Managed Care - PPO | Admitting: Family Medicine

## 2018-05-15 VITALS — BP 126/76 | HR 85 | Temp 98.0°F | Resp 18 | Ht 66.0 in | Wt 179.2 lb

## 2018-05-15 DIAGNOSIS — J3089 Other allergic rhinitis: Secondary | ICD-10-CM

## 2018-05-15 DIAGNOSIS — J302 Other seasonal allergic rhinitis: Secondary | ICD-10-CM | POA: Diagnosis not present

## 2018-05-15 DIAGNOSIS — J4550 Severe persistent asthma, uncomplicated: Secondary | ICD-10-CM | POA: Insufficient documentation

## 2018-05-15 MED ORDER — ALBUTEROL SULFATE HFA 108 (90 BASE) MCG/ACT IN AERS: 2.0000 | INHALATION_SPRAY | Freq: Four times a day (QID) | RESPIRATORY_TRACT | 3 refills | Status: DC | PRN

## 2018-05-15 NOTE — Progress Notes (Addendum)
[place] [address] Dept: [phone]  FOLLOW UP NOTE  Patient ID: Meagan Chung, female    DOB: November 13, 1985  Age: 33 y.o. MRN: [id number] Date of Office Visit: 05/15/2018  Assessment  Chief Complaint: Asthma  HPI Meagan Chung is a 33 year old female who presents to the clinic for a follow up visit. She was last seen in this cclinic on 10/21/2017 by Dr. Ernst Bowler for evaluation of asthma and allergic rhinitis. At today's visit, she reports that her asthma has been well controlled with no shortness of breath, cough or wheeze with activity or rest with Fasenra injections once every 8 weeks. She is not currently using montelukast or an asthma controller inhaler. She reports using her albuterol inhaler 2 times over the last year with resolution of symptoms. Allergic rhinitis is reported as well controlled with no medical intervention. Her current medications are listed in the chart.     Drug Allergies:  Allergies  Allergen Reactions  . Other Cough    Capers causes SOB Cats 4+    Physical Exam: BP 126/76   Pulse 85   Temp 98 F (36.7 C) (Oral)   Resp 18   Ht 5\' 6"  (1.676 m)   Wt 179 lb 3.2 oz (81.3 kg)   SpO2 96%   BMI 28.92 kg/m    Physical Exam Vitals signs reviewed.  Constitutional:      Appearance: Normal appearance.  HENT:     Head: Normocephalic and atraumatic.     Right Ear: Tympanic membrane normal.     Left Ear: Tympanic membrane normal.     Nose:     Comments: Bilateral nares slightly erythematous with no nasal drainage noted. Pharynx slightly erythematous with no exudate. Ears normal. Eyes normal. Eyes:     Conjunctiva/sclera: Conjunctivae normal.  Neck:     Musculoskeletal: Normal range of motion and neck supple.  Cardiovascular:     Rate and Rhythm: Normal rate and regular rhythm.     Heart sounds: Normal heart sounds. No murmur.  Pulmonary:     Effort: Pulmonary effort is normal.     Breath sounds: Normal breath  sounds.     Comments: Lungs clear to auscultation Musculoskeletal: Normal range of motion.  Skin:    General: Skin is warm and dry.  Neurological:     Mental Status: She is alert and oriented to person, place, and time.  Psychiatric:        Mood and Affect: Mood normal.        Behavior: Behavior normal.        Thought Content: Thought content normal.        Judgment: Judgment normal.     Diagnostics: FVC 4.83, FEV1 3.83. Predicted FVC 4.03, predicted FEV1 3.35. Spirometry is within the normal range.  Assessment and Plan: 1. Severe persistent asthma without complication   2. Seasonal and perennial allergic rhinitis     Meds ordered this encounter  Medications  . albuterol (VENTOLIN HFA) 108 (90 Base) MCG/ACT inhaler    Sig: Inhale 2 puffs into the lungs every 6 (six) hours as needed for wheezing or shortness of breath.    Dispense:  1 Inhaler    Refill:  3    Pt will call when needed    Patient Instructions  Severe persistent asthma, unspecified whether complicated Continue Fasenra injections once every 8 weeks to prevent cough and wheeze Continue albuterol 2 puffs every 4 hours as needed for cough  and wheeze  Seasonal and perennial allergic rhinitis Continue cetirizine as needed for a runny nose Consider nasal rinses as needed for nasal symptoms Flonase 1-2 sprays once a day as needed for a stuffy nose  Call the clinic if this treatment plan is not working well for you  Follow up in 6 months or sooner if needed   Return in about 6 months (around 11/15/2018), or if symptoms worsen or fail to improve.    Thank you for the opportunity to care for this patient.  Please do not hesitate to contact me with questions.  Gareth Morgan, FNP [facility] [place]  _________________________________________________  I have provided oversight concerning [facility] Amb's evaluation and treatment of this patient's health issues addressed during today's encounter.  I  agree with the assessment and therapeutic plan as outlined in the note.   Signed,   R Edgar Frisk, MD

## 2018-05-15 NOTE — Patient Instructions (Addendum)
Severe persistent asthma, unspecified whether complicated Continue Fasenra injections once every 8 weeks to prevent cough and wheeze Continue albuterol 2 puffs every 4 hours as needed for cough and wheeze  Seasonal and perennial allergic rhinitis Continue cetirizine as needed for a runny nose Consider nasal rinses as needed for nasal symptoms Flonase 1-2 sprays once a day as needed for a stuffy nose  Call the clinic if this treatment plan is not working well for you  Follow up in 6 months or sooner if needed

## 2018-05-15 NOTE — Telephone Encounter (Signed)
Requesting:xanax Contract:yes UDS:low risk next screen 04/26/18 Last OV:10/24/17 Next OV:06/15/18 Last Refill:02/28/18  #20-01rf Database:   Please advise

## 2018-05-23 ENCOUNTER — Ambulatory Visit (INDEPENDENT_AMBULATORY_CARE_PROVIDER_SITE_OTHER): Payer: Commercial Managed Care - PPO | Admitting: *Deleted

## 2018-05-23 ENCOUNTER — Other Ambulatory Visit: Payer: Self-pay

## 2018-05-23 DIAGNOSIS — J4550 Severe persistent asthma, uncomplicated: Secondary | ICD-10-CM

## 2018-05-30 ENCOUNTER — Encounter: Payer: Commercial Managed Care - PPO | Admitting: Family Medicine

## 2018-06-15 ENCOUNTER — Encounter: Payer: Commercial Managed Care - PPO | Admitting: Family Medicine

## 2018-07-10 ENCOUNTER — Other Ambulatory Visit: Payer: Self-pay | Admitting: Allergy & Immunology

## 2018-07-10 ENCOUNTER — Other Ambulatory Visit: Payer: Self-pay | Admitting: *Deleted

## 2018-07-10 MED ORDER — BENRALIZUMAB 30 MG/ML ~~LOC~~ SOSY: 30.0000 mg | PREFILLED_SYRINGE | SUBCUTANEOUS | 8 refills | Status: DC

## 2018-07-18 ENCOUNTER — Ambulatory Visit: Payer: Self-pay

## 2018-07-21 ENCOUNTER — Ambulatory Visit (INDEPENDENT_AMBULATORY_CARE_PROVIDER_SITE_OTHER): Payer: Commercial Managed Care - PPO

## 2018-07-21 ENCOUNTER — Other Ambulatory Visit: Payer: Self-pay

## 2018-07-21 DIAGNOSIS — J4550 Severe persistent asthma, uncomplicated: Secondary | ICD-10-CM

## 2018-09-01 ENCOUNTER — Other Ambulatory Visit: Payer: Self-pay | Admitting: Family Medicine

## 2018-09-01 DIAGNOSIS — F329 Major depressive disorder, single episode, unspecified: Secondary | ICD-10-CM

## 2018-09-01 DIAGNOSIS — F419 Anxiety disorder, unspecified: Secondary | ICD-10-CM

## 2018-09-01 DIAGNOSIS — F32A Depression, unspecified: Secondary | ICD-10-CM

## 2018-09-04 NOTE — Telephone Encounter (Signed)
Requesting:xanax Contract:yes UDS:low risk next screen 04/28/18 Last OV:10/24/17 Next OV:09/18/18 Last Refill:05/15/18  #20-1rf Database:   Please advise

## 2018-09-14 ENCOUNTER — Ambulatory Visit (INDEPENDENT_AMBULATORY_CARE_PROVIDER_SITE_OTHER): Payer: Commercial Managed Care - PPO

## 2018-09-14 ENCOUNTER — Other Ambulatory Visit: Payer: Self-pay

## 2018-09-14 DIAGNOSIS — J4550 Severe persistent asthma, uncomplicated: Secondary | ICD-10-CM

## 2018-09-18 ENCOUNTER — Encounter: Payer: Commercial Managed Care - PPO | Admitting: Family Medicine

## 2018-10-05 LAB — HM PAP SMEAR: HM Pap smear: NEGATIVE

## 2018-10-06 ENCOUNTER — Other Ambulatory Visit: Payer: Self-pay

## 2018-10-06 ENCOUNTER — Ambulatory Visit (INDEPENDENT_AMBULATORY_CARE_PROVIDER_SITE_OTHER): Payer: Commercial Managed Care - PPO | Admitting: Family Medicine

## 2018-10-06 ENCOUNTER — Encounter: Payer: Self-pay | Admitting: Family Medicine

## 2018-10-06 DIAGNOSIS — Z20828 Contact with and (suspected) exposure to other viral communicable diseases: Secondary | ICD-10-CM | POA: Diagnosis not present

## 2018-10-06 DIAGNOSIS — Z20822 Contact with and (suspected) exposure to covid-19: Secondary | ICD-10-CM

## 2018-10-06 NOTE — Progress Notes (Signed)
No chief complaint on file.   Subjective: Patient is a 33 y.o. female here for exposure to covid-19. Due to COVID-19 pandemic, we are interacting via web portal for an electronic face-to-face visit. I verified patient's ID using 2 identifiers. Patient agreed to proceed with visit via this method. Patient is at home, I am at office. Patient and I are present for visit.   Pt's ex husband went to [place]. Per court orders, to her duaghter needed to be tested for COVID and he was positive. Asymptomatic. 3-5 min each morning this past business week, they were all masked and near. Daughter had runny nose this AM. Pt is asymptomatic. Not pleased with ex-husband. Would like to be tested.   ROS: Const: no fevers  Past Medical History:  Diagnosis Date  . Anxiety and depression    anxiety, bulemia in past all well controlled   . Asthma   . Depression    anxiety, bulemia in past all well controlled  . Diarrhea 11/29/2014  . Hyperlipidemia, mixed 02/02/2015  . Thyroid nodule     Objective: No conversational dyspnea Age appropriate judgment and insight Nml affect and mood  Assessment and Plan: Exposure to Covid-19 Virus - Plan: test, Warning signs and symptoms verbalized. The patient voiced understanding and agreement to the plan.  [place], DO 10/06/18  3:51 PM

## 2018-10-09 ENCOUNTER — Other Ambulatory Visit: Payer: Self-pay

## 2018-10-09 DIAGNOSIS — Z20822 Contact with and (suspected) exposure to covid-19: Secondary | ICD-10-CM

## 2018-10-10 LAB — NOVEL CORONAVIRUS, NAA: SARS-CoV-2, NAA: NOT DETECTED

## 2018-11-09 ENCOUNTER — Ambulatory Visit: Payer: Self-pay

## 2018-11-09 ENCOUNTER — Ambulatory Visit (INDEPENDENT_AMBULATORY_CARE_PROVIDER_SITE_OTHER): Payer: Commercial Managed Care - PPO | Admitting: Internal Medicine

## 2018-11-09 ENCOUNTER — Encounter: Payer: Self-pay | Admitting: Internal Medicine

## 2018-11-09 DIAGNOSIS — J019 Acute sinusitis, unspecified: Secondary | ICD-10-CM

## 2018-11-09 MED ORDER — AMOXICILLIN 875 MG PO TABS: 875.0000 mg | ORAL_TABLET | Freq: Two times a day (BID) | ORAL | 0 refills | Status: DC

## 2018-11-09 NOTE — Progress Notes (Signed)
Subjective:    Patient ID: Meagan Chung, female    DOB: May 18, 1985, 33 y.o.   MRN: [id number]  DOS:  11/09/2018 Type of visit - description: Virtual Visit via Video Note  I connected with@   by a video enabled telemedicine application and verified that I am speaking with the correct person using two identifiers.   THIS ENCOUNTER IS A VIRTUAL VISIT DUE TO COVID-19 - PATIENT WAS NOT SEEN IN THE OFFICE. PATIENT HAS CONSENTED TO VIRTUAL VISIT / TELEMEDICINE VISIT   Location of patient: home  Location of provider: office  I discussed the limitations of evaluation and management by telemedicine and the availability of in person appointments. The patient expressed understanding and agreed to proceed.  History of Present Illness: Acute About 10 days ago, he went to our urgent care, she was having cough, sore throat, nasal congestion. COVID testing was negative. She was recommended supportive treatment. She is calling because she is no better. Actually has a lot of sinus pressure and congestion, worse on the right.  She has asthma, currently under very good control Review of Systems  No fever chills No nausea vomiting or diarrhea She was blowing discharge from the nose but in the last few days she has pressure but the nasal d/c has decreased. Had sore throat at first but that is resolved.  Past Medical History:  Diagnosis Date  . Anxiety and depression    anxiety, bulemia in past all well controlled   . Asthma   . Depression    anxiety, bulemia in past all well controlled  . Diarrhea 11/29/2014  . Hyperlipidemia, mixed 02/02/2015  . Thyroid nodule     Past Surgical History:  Procedure Laterality Date  . ANKLE SURGERY    . COLPOSCOPY  09/2017  . WISDOM TOOTH EXTRACTION Bilateral     Social History   Socioeconomic History  . Marital status: Married    Spouse name: Not on file  . Number of children: 0  . Years of education: Not on file  . Highest education level:  Not on file  Occupational History  . Occupation: development  Social Needs  . Financial resource strain: Not on file  . Food insecurity    Worry: Not on file    Inability: Not on file  . Transportation needs    Medical: Not on file    Non-medical: Not on file  Tobacco Use  . Smoking status: Never Smoker  . Smokeless tobacco: Never Used  Substance and Sexual Activity  . Alcohol use: Yes    Alcohol/week: 0.0 standard drinks    Comment: rarely  . Drug use: No  . Sexual activity: Yes    Comment: lives with fiance, fund raise for [employer], avoids gluten, minimize dairy,  Lifestyle  . Physical activity    Days per week: Not on file    Minutes per session: Not on file  . Stress: Not on file  Relationships  . Social Herbalist on phone: Not on file    Gets together: Not on file    Attends religious service: Not on file    Active member of club or organization: Not on file    Attends meetings of clubs or organizations: Not on file    Relationship status: Not on file  . Intimate partner violence    Fear of current or ex partner: Not on file    Emotionally abused: Not on file    Physically abused:  Not on file    Forced sexual activity: Not on file  Other Topics Concern  . Not on file  Social History Narrative  . Not on file      Allergies as of 11/09/2018      Reactions   Other Cough   Capers causes SOB Cats 4+      Medication List       Accurate as of November 09, 2018  3:36 PM. If you have any questions, ask your nurse or doctor.        albuterol (2.5 MG/3ML) 0.083% nebulizer solution Commonly known as: PROVENTIL Take 3 mLs (2.5 mg total) by nebulization every 6 (six) hours as needed for wheezing or shortness of breath.   albuterol 108 (90 Base) MCG/ACT inhaler Commonly known as: Ventolin HFA Inhale 2 puffs into the lungs every 6 (six) hours as needed for wheezing or shortness of breath.   ALPRAZolam 0.25 MG tablet Commonly known as:  XANAX TAKE 1 TABLET(0.25 MG) BY MOUTH TWICE DAILY AS NEEDED FOR ANXIETY   Benralizumab 30 MG/ML Sosy Commonly known as: Fasenra Inject 30 mg into the skin every 8 (eight) weeks.   sertraline 100 MG tablet Commonly known as: ZOLOFT Take 150 mg by mouth daily.           Objective:   Physical Exam There were no vitals taken for this visit. This is a virtual video visit.  She is alert oriented x3, no apparent distress, speaking in complete sentences.  She sounds like she has nasal congestion.    Assessment    33 year old female with a history of asthma, s/p  admission to ICU due to pneumonia in 2016, GERD on birth control pills (not on her medication list), h/o depression/anxiety presents with   Sinusitis: The patient knows the limitations of these visit, symptoms are consistent with mild to moderate sinusitis. Plan: Amoxicillin, Flonase, Robitussin, low-dose decongestants.  We will send a message to her via my chart with a summary of the recommendations.  She verbalized understanding.     I discussed the assessment and treatment plan with the patient. The patient was provided an opportunity to ask questions and all were answered. The patient agreed with the plan and demonstrated an understanding of the instructions.   The patient was advised to call back or seek an in-person evaluation if the symptoms worsen or if the condition fails to improve as anticipated.

## 2018-11-14 ENCOUNTER — Ambulatory Visit (INDEPENDENT_AMBULATORY_CARE_PROVIDER_SITE_OTHER): Payer: Commercial Managed Care - PPO

## 2018-11-14 ENCOUNTER — Other Ambulatory Visit: Payer: Self-pay

## 2018-11-14 DIAGNOSIS — J4550 Severe persistent asthma, uncomplicated: Secondary | ICD-10-CM

## 2018-12-21 ENCOUNTER — Encounter: Payer: Commercial Managed Care - PPO | Admitting: Family Medicine

## 2019-01-09 ENCOUNTER — Ambulatory Visit: Payer: Self-pay

## 2019-01-30 LAB — HM MAMMOGRAPHY

## 2019-02-15 ENCOUNTER — Ambulatory Visit (INDEPENDENT_AMBULATORY_CARE_PROVIDER_SITE_OTHER): Payer: Commercial Managed Care - PPO | Admitting: Family Medicine

## 2019-02-15 DIAGNOSIS — E079 Disorder of thyroid, unspecified: Secondary | ICD-10-CM

## 2019-02-18 NOTE — Progress Notes (Signed)
Patient not seen.

## 2019-02-26 ENCOUNTER — Other Ambulatory Visit: Payer: Self-pay

## 2019-02-26 ENCOUNTER — Encounter: Payer: Self-pay | Admitting: Family Medicine

## 2019-02-26 ENCOUNTER — Ambulatory Visit (INDEPENDENT_AMBULATORY_CARE_PROVIDER_SITE_OTHER): Payer: Commercial Managed Care - PPO | Admitting: Family Medicine

## 2019-02-26 DIAGNOSIS — J324 Chronic pansinusitis: Secondary | ICD-10-CM | POA: Diagnosis not present

## 2019-02-26 MED ORDER — AMOXICILLIN 875 MG PO TABS: 875.0000 mg | ORAL_TABLET | Freq: Two times a day (BID) | ORAL | 0 refills | Status: DC

## 2019-02-26 NOTE — Progress Notes (Signed)
Virtual Visit via Video Note  I connected with Talayshia Urso on 02/26/19 at  1:40 PM EST by a video enabled telemedicine application and verified that I am speaking with the correct person using two identifiers.  Location: Patient: home  Provider: office   I discussed the limitations of evaluation and management by telemedicine and the availability of in person appointments. The patient expressed understanding and agreed to proceed.  History of Present Illness: Pt is home c/o sinus congestion  X 2 -3 days and fever started last night    Current Outpatient Medications on File Prior to Visit  Medication Sig Dispense Refill  . albuterol (PROVENTIL) (2.5 MG/3ML) 0.083% nebulizer solution Take 3 mLs (2.5 mg total) by nebulization every 6 (six) hours as needed for wheezing or shortness of breath. 150 mL 1  . albuterol (VENTOLIN HFA) 108 (90 Base) MCG/ACT inhaler Inhale 2 puffs into the lungs every 6 (six) hours as needed for wheezing or shortness of breath. 1 Inhaler 3  . ALPRAZolam (XANAX) 0.25 MG tablet TAKE 1 TABLET(0.25 MG) BY MOUTH TWICE DAILY AS NEEDED FOR ANXIETY 20 tablet 1  . Benralizumab (FASENRA) 30 MG/ML SOSY Inject 30 mg into the skin every 8 (eight) weeks. 1 Syringe 8  . sertraline (ZOLOFT) 100 MG tablet Take 150 mg by mouth daily.     Current Facility-Administered Medications on File Prior to Visit  Medication Dose Route Frequency Provider Last Rate Last Admin  . Benralizumab SOSY 30 mg  30 mg Subcutaneous Q8 Toney Reil, MD   30 mg at 11/14/18 0945   Past Medical History:  Diagnosis Date  . Anxiety and depression    anxiety, bulemia in past all well controlled   . Asthma   . Depression    anxiety, bulemia in past all well controlled  . Diarrhea 11/29/2014  . Hyperlipidemia, mixed 02/02/2015  . Thyroid nodule    Social History   Socioeconomic History  . Marital status: Married    Spouse name: Not on file  . Number of children: 0  . Years of  education: Not on file  . Highest education level: Not on file  Occupational History  . Occupation: development  Tobacco Use  . Smoking status: Never Smoker  . Smokeless tobacco: Never Used  Substance and Sexual Activity  . Alcohol use: Yes    Alcohol/week: 0.0 standard drinks    Comment: rarely  . Drug use: No  . 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 Not on file  Social History Narrative  . Not on file   Social Determinants of Health   Financial Resource Strain:   . Difficulty of Paying Living Expenses: Not on file  Food Insecurity:   . Worried About Charity fundraiser in the Last Year: Not on file  . Ran Out of Food in the Last Year: Not on file  Transportation Needs:   . Lack of Transportation (Medical): Not on file  . Lack of Transportation (Non-Medical): Not on file  Physical Activity:   . Days of Exercise per Week: Not on file  . Minutes of Exercise per Session: Not on file  Stress:   . Feeling of Stress : Not on file  Social Connections:   . Frequency of Communication with Friends and Family: Not on file  . Frequency of Social Gatherings with Friends and Family: Not on file  .  Attends Religious Services: Not on file  . Active Member of Clubs or Organizations: Not on file  . Attends Archivist Meetings: Not on file  . Marital Status: Not on file  Intimate Partner Violence:   . Fear of Current or Ex-Partner: Not on file  . Emotionally Abused: Not on file  . Physically Abused: Not on file  . Sexually Abused: Not on file   Past Surgical History:  Procedure Laterality Date  . ANKLE SURGERY    . COLPOSCOPY  09/2017  . WISDOM TOOTH EXTRACTION Bilateral    Observations/Objective: 100 temp,  No other vitals obtained Pt is in NAd Assessment and Plan:  Pansinusitis, unspecified chronicity - Plan: amoxicillin (AMOXIL) 875 MG tablet flonase and otc  antihistamine F/u prn  Follow Up Instructions:    I discussed the assessment and treatment plan with the patient. The patient was provided an opportunity to ask questions and all were answered. The patient agreed with the plan and demonstrated an understanding of the instructions.   The patient was advised to call back or seek an in-person evaluation if the symptoms worsen or if the condition fails to improve as anticipated.  I provided 15 minutes of non-face-to-face time during this encounter.   Ann Held, DO

## 2019-03-08 ENCOUNTER — Ambulatory Visit (INDEPENDENT_AMBULATORY_CARE_PROVIDER_SITE_OTHER): Payer: Commercial Managed Care - PPO | Admitting: Family Medicine

## 2019-03-08 ENCOUNTER — Encounter: Payer: Self-pay | Admitting: Family Medicine

## 2019-03-08 ENCOUNTER — Other Ambulatory Visit: Payer: Self-pay

## 2019-03-08 VITALS — BP 114/72 | HR 76 | Temp 97.0°F | Resp 16 | Ht 65.8 in | Wt 197.2 lb

## 2019-03-08 DIAGNOSIS — J3089 Other allergic rhinitis: Secondary | ICD-10-CM

## 2019-03-08 DIAGNOSIS — J302 Other seasonal allergic rhinitis: Secondary | ICD-10-CM

## 2019-03-08 DIAGNOSIS — J019 Acute sinusitis, unspecified: Secondary | ICD-10-CM

## 2019-03-08 DIAGNOSIS — J4540 Moderate persistent asthma, uncomplicated: Secondary | ICD-10-CM | POA: Diagnosis not present

## 2019-03-08 MED ORDER — ALBUTEROL SULFATE HFA 108 (90 BASE) MCG/ACT IN AERS: 2.0000 | INHALATION_SPRAY | Freq: Four times a day (QID) | RESPIRATORY_TRACT | 1 refills | Status: DC | PRN

## 2019-03-08 MED ORDER — FLOVENT HFA 110 MCG/ACT IN AERO: 2.0000 | INHALATION_SPRAY | Freq: Two times a day (BID) | RESPIRATORY_TRACT | 5 refills | Status: DC

## 2019-03-08 NOTE — Patient Instructions (Addendum)
Severe persistent asthma, unspecified whether complicated Restart Fasenra injections once every 8 weeks to prevent cough and wheeze. You will next hear from our biologics co-ordinator, Tammy, about next steps and the possibility of home administration of Fasenra Continue albuterol 2 puffs every 4 hours as needed for cough and wheeze For asthma flares, begin Flovent 110-2 puffs twice a day with a spacer for 2 weeks or until cough and wheeze free  Seasonal and perennial allergic rhinitis Continue cetirizine as needed for a runny nose Consider nasal rinses as needed for nasal symptoms Flonase 1-2 sprays once a day as needed for a stuffy nose  Acute sinusitis Continue the antibiotic prescribed previously Continue the treatment plan for allergic rhinitis as above  Call the clinic if this treatment plan is not working well for you  Follow up in 6 months or sooner if needed

## 2019-03-08 NOTE — Progress Notes (Addendum)
[address] [place] [address] Dept: [phone]  FOLLOW UP NOTE  Patient ID: Meagan Chung, female    DOB: [birth year]  Age: 34 y.o. MRN: [id number] Date of Office Visit: 03/08/2019  Assessment  Chief Complaint: Asthma  HPI Meagan Chung is a 34 year old female who presents to the clinic for a follow up visit. She was last seen in this clinic on 05/15/2018 for evaluation of asthma and allergic rhinitis. She reports her asthma has been well controlled with no shortness of breath, cough, or wheeze with activity or rest. She has used her albuterol 2 times over the last few months. She has been receiving Fasenra injections once every 8 weeks with her last injection on 11/14/2018 when her co-pay assistance ran out and the medication was not affordable at that time. She is interested in restarting this medication at this time. Allergic rhinitis is reported as well controlled with Flonase as needed with relief of symptoms. She is currently taking amoxacillin for acute sinusitis which is beginning to resolve. Her current medications are listed in the chart.    Drug Allergies:  Allergies  Allergen Reactions  . Other Cough    Capers causes SOB Cats 4+    Physical Exam: BP 114/72   Pulse 76   Temp (!) 97 F (36.1 C) (Temporal)   Resp 16   Ht 5' 5.8" (1.671 m)   Wt 197 lb 3.2 oz (89.4 kg)   SpO2 97%   BMI 32.02 kg/m    Physical Exam Vitals reviewed.  Constitutional:      Appearance: Normal appearance.  HENT:     Head: Normocephalic and atraumatic.     Right Ear: Tympanic membrane normal.     Left Ear: Tympanic membrane normal.     Nose:     Comments: Bilateral nares normal. Pharynx normal. Ears normal. Eyes normal.    Mouth/Throat:     Pharynx: Oropharynx is clear.  Eyes:     Conjunctiva/sclera: Conjunctivae normal.  Cardiovascular:     Rate and Rhythm: Normal rate and regular rhythm.     Heart sounds: Normal heart sounds. No murmur.  Pulmonary:   Effort: Pulmonary effort is normal.     Breath sounds: Normal breath sounds.     Comments: Lungs clear to auscultation Musculoskeletal:        General: Normal range of motion.     Cervical back: Normal range of motion and neck supple.  Skin:    General: Skin is warm and dry.  Neurological:     Mental Status: She is alert and oriented to person, place, and time.  Psychiatric:        Mood and Affect: Mood normal.        Behavior: Behavior normal.        Thought Content: Thought content normal.        Judgment: Judgment normal.    Diagnostics: FVC 4.84, FEV1 3.77. Predicted FVC 4.02, predicted FEV1 3.33. Spirometry indicates normal ventilatory function.   Assessment and Plan: 1. Moderate persistent asthma without complication   2. Seasonal and perennial allergic rhinitis   3. Acute sinusitis, recurrence not specified, unspecified location     Meds ordered this encounter  Medications  . albuterol (VENTOLIN HFA) 108 (90 Base) MCG/ACT inhaler    Sig: Inhale 2 puffs into the lungs every 6 (six) hours as needed for wheezing or shortness of breath.    Dispense:  18 g    Refill:  1    Pt will call when needed  . fluticasone (FLOVENT HFA) 110 MCG/ACT inhaler    Sig: Inhale 2 puffs into the lungs 2 (two) times daily.    Dispense:  1 Inhaler    Refill:  5    Hold please. Pt will call when needed    Patient Instructions  Severe persistent asthma, unspecified whether complicated Restart Fasenra injections once every 8 weeks to prevent cough and wheeze. You will next hear from our biologics co-ordinator, Tammy, about next steps and the possibility of home administration of Fasenra Continue albuterol 2 puffs every 4 hours as needed for cough and wheeze For asthma flares, begin Flovent 110-2 puffs twice a day with a spacer for 2 weeks or until cough and wheeze free  Seasonal and perennial allergic rhinitis Continue cetirizine as needed for a runny nose Consider nasal rinses as needed  for nasal symptoms Flonase 1-2 sprays once a day as needed for a stuffy nose  Acute sinusitis Continue the antibiotic prescribed previously Continue the treatment plan for allergic rhinitis as above  Call the clinic if this treatment plan is not working well for you  Follow up in 6 months or sooner if needed   Return in about 6 months (around 09/05/2019), or if symptoms worsen or fail to improve.    Thank you for the opportunity to care for this patient.  Please do not hesitate to contact me with questions.  Gareth Morgan, FNP [facility]  I have provided oversight concerning Gareth Morgan' evaluation and treatment of this patient's health issues addressed during today's encounter. I agree with the assessment and therapeutic plan as outlined in the note.   Thank you for the opportunity to care for this patient.  Please do not hesitate to contact me with questions.  Penne Lash, M.D.  [facility] [address] [place], [place] [address] [phone]

## 2019-03-09 ENCOUNTER — Telehealth: Payer: Self-pay

## 2019-03-09 ENCOUNTER — Other Ambulatory Visit: Payer: Self-pay

## 2019-03-09 MED ORDER — EPINEPHRINE 0.3 MG/0.3ML IJ SOAJ: 0.3000 mg | INTRAMUSCULAR | 1 refills | Status: DC | PRN

## 2019-03-09 NOTE — Telephone Encounter (Signed)
I left a message for the patient to call either [facility] or [employer].

## 2019-03-09 NOTE — Telephone Encounter (Signed)
Thank you :)

## 2019-03-09 NOTE — Telephone Encounter (Signed)
[  Yesterday 2:07 PM] Ambs, Webb Silversmith     can you please call Meagan Chung and ask her if she has/needs an epinephrine device for her sob with capers? thank you. She has [employer] so maybe AuviQ?   Lm for pt to call us back

## 2019-03-09 NOTE — Telephone Encounter (Signed)
Patient called back. She does not have an epinephrine device. We called in Auvi-Q to [facility]. Made patient aware.

## 2019-04-02 ENCOUNTER — Ambulatory Visit (INDEPENDENT_AMBULATORY_CARE_PROVIDER_SITE_OTHER): Payer: Commercial Managed Care - PPO | Admitting: Allergy & Immunology

## 2019-04-02 ENCOUNTER — Telehealth: Payer: Self-pay

## 2019-04-02 ENCOUNTER — Encounter: Payer: Self-pay | Admitting: Allergy & Immunology

## 2019-04-02 ENCOUNTER — Other Ambulatory Visit: Payer: Self-pay

## 2019-04-02 DIAGNOSIS — J4540 Moderate persistent asthma, uncomplicated: Secondary | ICD-10-CM

## 2019-04-02 DIAGNOSIS — J302 Other seasonal allergic rhinitis: Secondary | ICD-10-CM

## 2019-04-02 DIAGNOSIS — J3089 Other allergic rhinitis: Secondary | ICD-10-CM

## 2019-04-02 MED ORDER — PREDNISONE 10 MG PO TABS: ORAL_TABLET | ORAL | 0 refills | Status: DC

## 2019-04-02 NOTE — Telephone Encounter (Signed)
Patient called saying she used her albuterol all weekend every 6-8 hours and wanted to make sure she doesn't need.

## 2019-04-02 NOTE — Progress Notes (Signed)
RE: Meagan Chung MRN: [id number] DOB: 11-14-1985 Date of Telemedicine Visit: 04/02/2019  Referring provider: Mosie Lukes, MD Primary care provider: Mosie Lukes, MD  Chief Complaint: No chief complaint on file.   Telemedicine Follow Up Visit via Telephone: I connected with Meagan Chung for a follow up on 04/02/19 by telephone and verified that I am speaking with the correct person using two identifiers.   I discussed the limitations, risks, security and privacy concerns of performing an evaluation and management service by telephone and the availability of in person appointments. I also discussed with the patient that there may be a patient responsible charge related to this service. The patient expressed understanding and agreed to proceed.  Patient is at work.  Provider is at the office.  Visit start time: 9:40 AM Visit end time: 9:55 AM Insurance consent/check in by: [place] consent and medical assistant/nurse: Meagan Chung  History of Present Illness:  She is a 34 y.o. female, who is being followed for severe persistent asthma and allergic rhinitis. Her previous allergy office visit was in January 2021 with Meagan Chung, [place].  She was last seen 1 month ago by Meagan Chung.  At that time, her Berna Bue was restarted.  She was continued on albuterol 2 puffs every 4-6 hours as needed as well as Flovent 110 mcg 2 puffs twice daily.  Her allergic rhinitis was controlled with cetirizine as well as Flonase as needed.  She was continued on her antibiotic that was already started for acute sinusitis.  Asthma/Respiratory Symptom History: She was doing very well on Fasenra. However, copay assistance program ran out in September of last year. She talked to [facility] and they could not figure out to get it for the end of the year. She has actually done well until this past week. She got her first shot two weeks ago. She got it at home and is unsure if it all went in. She has bene finre but  then over the weekend she started using rescue inhaler more.   Allergic Rhinitis Symptom History: She remains on the cetirizine as well as the fluticasone nasal spray. He has not needed any antibiotics at all.    She continues to work at [employer] She has divorced her husband officially and they are sharing custody. She is not happy with her ex husband because he does not think that Meagan Chung exists.   Otherwise, there have been no changes to her past medical history, surgical history, family history, or social history.  Assessment and Plan:  Meagan Chung is a 34 y.o. female with:  Severe persistent asthma with acute exacerbation - previously stable on Fasenra (restarted two weeks ago)  Recurrent infections- with normal screening  Seasonal and perennial allergic rhinitis(trees, weeds, grasses, indoor molds, dust mites, cat and dog)   Ms. Meagan Chung presents for a sick visit.  She was seen around 1 month ago and was doing well at that time.  Unfortunately, her high deductible plan kept her from receiving her Berna Bue injection last fall, which clearly set her up for an exacerbation today.  She has been using her rescue inhaler multiple times a day over the past 3 days with minimal relief.  We are going to start her on a prolonged prednisone taper and I instructed her to use her Flovent 2 puffs twice daily for 1 week to help with inflammation.  She will call us if there are any changes.  She received her Berna Bue around 2 weeks ago,  but admits that she might have taken the needle out before it was finished injecting.  She is going to get her second dose in a couple of weeks, as she is reloading with a dose every 3 months before spacing out again.  She is going to be taking her autoinjector to her school, where she can asked the nurse to inject it.  She is fine with this plan.  Diagnostics: None.  Medication List:  Current Outpatient Medications  Medication Sig Dispense Refill  .  albuterol (PROVENTIL) (2.5 MG/3ML) 0.083% nebulizer solution Take 3 mLs (2.5 mg total) by nebulization every 6 (six) hours as needed for wheezing or shortness of breath. (Patient not taking: Reported on 03/08/2019) 150 mL 1  . albuterol (VENTOLIN HFA) 108 (90 Base) MCG/ACT inhaler Inhale 2 puffs into the lungs every 6 (six) hours as needed for wheezing or shortness of breath. 18 g 1  . amoxicillin (AMOXIL) 875 MG tablet Take 1 tablet (875 mg total) by mouth 2 (two) times daily. 20 tablet 0  . Benralizumab (FASENRA) 30 MG/ML SOSY Inject 30 mg into the skin every 8 (eight) weeks. (Patient not taking: Reported on 03/08/2019) 1 Syringe 8  . citalopram (CELEXA) 20 MG tablet Take 20 mg by mouth daily.    Marland Kitchen EPINEPHrine (AUVI-Q) 0.3 mg/0.3 mL IJ SOAJ injection Inject 0.3 mLs (0.3 mg total) into the muscle as needed for anaphylaxis. 1 each 1  . fluticasone (FLOVENT HFA) 110 MCG/ACT inhaler Inhale 2 puffs into the lungs 2 (two) times daily. 1 Inhaler 5   Current Facility-Administered Medications  Medication Dose Route Frequency Provider Last Rate Last Admin  . Benralizumab SOSY 30 mg  30 mg Subcutaneous Q8 Meagan Reil, MD   30 mg at 11/14/18 0945   Allergies: Allergies  Allergen Reactions  . Other Cough    Capers causes SOB Cats 4+   I reviewed her past medical history, social history, family history, and environmental history and no significant changes have been reported from previous visits.  Review of Systems  Constitutional: Negative for activity change, appetite change, chills, diaphoresis, fatigue and fever.  HENT: Negative for congestion, postnasal drip, rhinorrhea, sinus pressure and sore throat.   Eyes: Negative for pain, discharge, redness and itching.  Respiratory: Positive for cough, chest tightness, shortness of breath and wheezing.   Gastrointestinal: Negative for diarrhea, nausea and vomiting.  Endocrine: Negative for cold intolerance and heat intolerance.    Musculoskeletal: Negative for arthralgias, joint swelling and myalgias.  Skin: Negative for rash.  Allergic/Immunologic: Negative for environmental allergies and food allergies.    Objective:  Physical exam not obtained as encounter was done via telephone.   Previous notes and tests were reviewed.  I discussed the assessment and treatment plan with the patient. The patient was provided an opportunity to ask questions and all were answered. The patient agreed with the plan and demonstrated an understanding of the instructions.   The patient was advised to call back or seek an in-person evaluation if the symptoms worsen or if the condition fails to improve as anticipated.  I provided 15 minutes of non-face-to-face time during this encounter.  It was my pleasure to participate in [facility] care today. Please feel free to contact me with any questions or concerns.   Sincerely,  [name], MD

## 2019-04-23 ENCOUNTER — Other Ambulatory Visit: Payer: Self-pay | Admitting: Family Medicine

## 2019-04-25 ENCOUNTER — Ambulatory Visit (INDEPENDENT_AMBULATORY_CARE_PROVIDER_SITE_OTHER): Payer: Commercial Managed Care - PPO | Admitting: Medical

## 2019-04-25 ENCOUNTER — Other Ambulatory Visit: Payer: Self-pay

## 2019-04-25 VITALS — Temp 97.6°F | Wt 197.0 lb

## 2019-04-25 DIAGNOSIS — J029 Acute pharyngitis, unspecified: Secondary | ICD-10-CM

## 2019-04-25 MED ORDER — AZITHROMYCIN 250 MG PO TABS: ORAL_TABLET | ORAL | 0 refills | Status: DC

## 2019-04-25 NOTE — Progress Notes (Signed)
   Subjective:    Patient ID: Meagan Chung, female    DOB: 11-06-1985, 34 y.o.   MRN: [id number]  HPI  Virtual Visit via Video Note  I connected with Meagan Chung on 04/25/19 at  2:20 PM EST by a video enabled telemedicine application and verified that I am speaking with the correct person using two identifiers.  Location: Patient: office Provider:office   I discussed the limitations of evaluation and management by telemedicine and the availability of in person appointments. The patient expressed understanding and agreed to proceed.  History of Present Illness:   Pt states she has st since yesterday. Pain is severe. Hurts even without swallowing. Dull ha. No fever, no chills, no sweats or bodyaches. Pt has mild pnd. No nasal congestion. No cough. No sob. No loss in smell.     Observations/Objective: General-no acute distress, pleasant, oriented. Lungs- on inspection lungs appear unlabored. Neck- no tracheal deviation or jvd on inspection. Neuro- gross motor function appears intact. heent- by video exam mild red pharynx with mild- moderate tonsil hypertrophy. Also on self palpation she thinks submandibular lymph nodes mild swollen.   Assessment and Plan: By your high level of throat pain and physical exam, I do suspect that she might have strep throat.  I went ahead and sent in a azithromycin antibiotic to your pharmacy.  I want you to stay at home/work from home pending clinical response to treatment.  If sore throat were to persist or new signs or symptoms were to occur then would recommend getting Covid test.  Presently not suspicious for Covid.  Patient did mention that she is scheduled to have her first Covid vaccine tomorrow.  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  I did counsel the patient that if she has no worsening signs or  symptoms and I think that the benefits exceeds risk of getting vaccine tomorrow.  However if she were to do develop full-blown viral type syndrome symptoms then would recommend not getting vaccine.  Patient expressed understanding.  Follow-up via MyChart Sunday.  Return to work date pending her report.  Mackie Pai, PA-C   30 minutes spent with patient today.  Follow Up Instructions:    I discussed the assessment and treatment plan with the patient. The patient was provided an opportunity to ask questions and all were answered. The patient agreed with the plan and demonstrated an understanding of the instructions.   The patient was advised to call back or seek an in-person evaluation if the symptoms worsen or if the condition fails to improve as anticipated.  I provided 20 minutes of non-face-to-face time during this encounter.   Mackie Pai, PA-C  .   Review of Systems     Objective:   Physical Exam        Assessment & Plan:

## 2019-04-25 NOTE — Patient Instructions (Signed)
By your high level of throat pain and physical exam, I do suspect that she might have strep throat.  I went ahead and sent in a azithromycin antibiotic to your pharmacy.  I want you to stay at home/work from home pending clinical response to treatment.  If sore throat were to persist or new signs or symptoms were to occur then would recommend getting Covid test.  Presently not suspicious for Covid.  Patient did mention that she is scheduled to have her first Covid vaccine tomorrow.  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  I did counsel the patient that if she has no worsening signs or symptoms and I think that the benefits exceeds risk of getting vaccine tomorrow.  However if she were to do develop full-blown viral type syndrome symptoms then would recommend not getting vaccine.  Patient expressed understanding.  Follow-up via MyChart Sunday.  Return to work date pending her report.

## 2019-04-26 ENCOUNTER — Telehealth: Payer: Self-pay

## 2019-04-26 ENCOUNTER — Encounter: Payer: Self-pay | Admitting: Allergy & Immunology

## 2019-04-26 NOTE — Telephone Encounter (Signed)
Meagan Chung "[place]"  [name], MD 6 hours ago (10:15 AM)   I have sinus infection number 6 or 7 right now - all within the last 6 months.  Should I see an ENT and if so, do you have one you recommend?  Every time I'm treated with antibiotics and then it comes back.  Thanks so much. Please advise on what ent

## 2019-04-26 NOTE — Telephone Encounter (Signed)
Can you please send her to Dr. Laurance Flatten. Thank you

## 2019-04-30 NOTE — Telephone Encounter (Signed)
Please advise to outgoing referral to dr Laurance Flatten for recurrent sinus infections! Thank you!

## 2019-05-03 NOTE — Telephone Encounter (Signed)
Thank you Dee

## 2019-05-03 NOTE — Telephone Encounter (Signed)
Referral has been placed to Dr Laurance Flatten for scheduling. I have sent the patient a mychart message with their information.   Thanks [employer]

## 2019-05-28 ENCOUNTER — Telehealth: Payer: Self-pay | Admitting: *Deleted

## 2019-05-28 NOTE — Telephone Encounter (Signed)
Patient has run out of copay card to pay for Meagan Chung so I had reach out to he to discuss getting her medication free from [place] and mailed app. I also reached out to [facility] to get sample for patient and L/m for patient to go by and get sample

## 2019-06-25 ENCOUNTER — Encounter: Payer: Self-pay | Admitting: Family Medicine

## 2019-06-25 ENCOUNTER — Other Ambulatory Visit: Payer: Self-pay

## 2019-06-25 ENCOUNTER — Ambulatory Visit (INDEPENDENT_AMBULATORY_CARE_PROVIDER_SITE_OTHER): Payer: Commercial Managed Care - PPO | Admitting: Family Medicine

## 2019-06-25 VITALS — BP 108/66 | HR 70 | Temp 98.1°F | Resp 12 | Ht 66.5 in | Wt 196.0 lb

## 2019-06-25 DIAGNOSIS — J019 Acute sinusitis, unspecified: Secondary | ICD-10-CM | POA: Diagnosis not present

## 2019-06-25 DIAGNOSIS — Z0000 Encounter for general adult medical examination without abnormal findings: Secondary | ICD-10-CM | POA: Insufficient documentation

## 2019-06-25 DIAGNOSIS — J4550 Severe persistent asthma, uncomplicated: Secondary | ICD-10-CM

## 2019-06-25 LAB — CBC
HCT: 40.9 % (ref 36.0–46.0)
Hemoglobin: 13.4 g/dL (ref 12.0–15.0)
MCHC: 32.7 g/dL (ref 30.0–36.0)
MCV: 88.8 fl (ref 78.0–100.0)
Platelets: 313 10*3/uL (ref 150.0–400.0)
RBC: 4.61 Mil/uL (ref 3.87–5.11)
RDW: 13.1 % (ref 11.5–15.5)
WBC: 7.3 10*3/uL (ref 4.0–10.5)

## 2019-06-25 LAB — LIPID PANEL
Cholesterol: 165 mg/dL (ref 0–200)
HDL: 55 mg/dL (ref >39.00)
LDL Cholesterol: 94 mg/dL (ref 0–99)
NonHDL: 109.54
Total CHOL/HDL Ratio: 3
Triglycerides: 80 mg/dL (ref 0.0–149.0)
VLDL: 16 mg/dL (ref 0.0–40.0)

## 2019-06-25 LAB — COMPREHENSIVE METABOLIC PANEL
ALT: 13 U/L (ref 0–35)
AST: 19 U/L (ref 0–37)
Albumin: 4.2 g/dL (ref 3.5–5.2)
Alkaline Phosphatase: 49 U/L (ref 39–117)
BUN: 12 mg/dL (ref 6–23)
CO2: 27 mEq/L (ref 19–32)
Calcium: 8.8 mg/dL (ref 8.4–10.5)
Chloride: 105 mEq/L (ref 96–112)
Creatinine, Ser: 0.8 mg/dL (ref 0.40–1.20)
GFR: 81.65 mL/min (ref >60.00)
Glucose, Bld: 84 mg/dL (ref 70–99)
Potassium: 4 mEq/L (ref 3.5–5.1)
Sodium: 138 mEq/L (ref 135–145)
Total Bilirubin: 0.5 mg/dL (ref 0.2–1.2)
Total Protein: 7.1 g/dL (ref 6.0–8.3)

## 2019-06-25 LAB — TSH: TSH: 1.07 u[IU]/mL (ref 0.35–4.50)

## 2019-06-25 NOTE — Assessment & Plan Note (Addendum)
Patient encouraged to maintain heart healthy diet, regular exercise, adequate sleep. Consider daily probiotics. Take medications as prescribed. Labs ordered and referred.

## 2019-06-25 NOTE — Patient Instructions (Signed)
Preventive Care 21-35 Years Old, Female Preventive care refers to visits with your health care provider and lifestyle choices that can promote health and wellness. This includes:  A yearly physical exam. This may also be called an annual well check.  Regular dental visits and eye exams.  Immunizations.  Screening for certain conditions.  Healthy lifestyle choices, such as eating a healthy diet, getting regular exercise, not using drugs or products that contain nicotine and tobacco, and limiting alcohol use. What can I expect for my preventive care visit? Physical exam Your health care provider will check your:  Height and weight. This may be used to calculate body mass index (BMI), which tells if you are at a healthy weight.  Heart rate and blood pressure.  Skin for abnormal spots. Counseling Your health care provider may ask you questions about your:  Alcohol, tobacco, and drug use.  Emotional well-being.  Home and relationship well-being.  Sexual activity.  Eating habits.  Work and work environment.  Method of birth control.  Menstrual cycle.  Pregnancy history. What immunizations do I need?  Influenza (flu) vaccine  This is recommended every year. Tetanus, diphtheria, and pertussis (Tdap) vaccine  You may need a Td booster every 10 years. Varicella (chickenpox) vaccine  You may need this if you have not been vaccinated. Human papillomavirus (HPV) vaccine  If recommended by your health care provider, you may need three doses over 6 months. Measles, mumps, and rubella (MMR) vaccine  You may need at least one dose of MMR. You may also need a second dose. Meningococcal conjugate (MenACWY) vaccine  One dose is recommended if you are age 19-21 years and a first-year college student living in a residence hall, or if you have one of several medical conditions. You may also need additional booster doses. Pneumococcal conjugate (PCV13) vaccine  You may need  this if you have certain conditions and were not previously vaccinated. Pneumococcal polysaccharide (PPSV23) vaccine  You may need one or two doses if you smoke cigarettes or if you have certain conditions. Hepatitis A vaccine  You may need this if you have certain conditions or if you travel or work in places where you may be exposed to hepatitis A. Hepatitis B vaccine  You may need this if you have certain conditions or if you travel or work in places where you may be exposed to hepatitis B. Haemophilus influenzae type b (Hib) vaccine  You may need this if you have certain conditions. You may receive vaccines as individual doses or as more than one vaccine together in one shot (combination vaccines). Talk with your health care provider about the risks and benefits of combination vaccines. What tests do I need?  Blood tests  Lipid and cholesterol levels. These may be checked every 5 years starting at age 20.  Hepatitis C test.  Hepatitis B test. Screening  Diabetes screening. This is done by checking your blood sugar (glucose) after you have not eaten for a while (fasting).  Sexually transmitted disease (STD) testing.  BRCA-related cancer screening. This may be done if you have a family history of breast, ovarian, tubal, or peritoneal cancers.  Pelvic exam and Pap test. This may be done every 3 years starting at age 21. Starting at age 30, this may be done every 5 years if you have a Pap test in combination with an HPV test. Talk with your health care provider about your test results, treatment options, and if necessary, the need for more tests.   Follow these instructions at home: Eating and drinking   Eat a diet that includes fresh fruits and vegetables, whole grains, lean protein, and low-fat dairy.  Take vitamin and mineral supplements as recommended by your health care provider.  Do not drink alcohol if: ? Your health care provider tells you not to drink. ? You are  pregnant, may be pregnant, or are planning to become pregnant.  If you drink alcohol: ? Limit how much you have to 0-1 drink a day. ? Be aware of how much alcohol is in your drink. In the [place] one drink equals one 12 oz bottle of beer (355 mL), one 5 oz glass of wine (148 mL), or one 1 oz glass of hard liquor (44 mL). Lifestyle  Take daily care of your teeth and gums.  Stay active. Exercise for at least 30 minutes on 5 or more days each week.  Do not use any products that contain nicotine or tobacco, such as cigarettes, e-cigarettes, and chewing tobacco. If you need help quitting, ask your health care provider.  If you are sexually active, practice safe sex. Use a condom or other form of birth control (contraception) in order to prevent pregnancy and STIs (sexually transmitted infections). If you plan to become pregnant, see your health care provider for a preconception visit. What's next?  Visit your health care provider once a year for a well check visit.  Ask your health care provider how often you should have your eyes and teeth checked.  Stay up to date on all vaccines. This information is not intended to replace advice given to you by your health care provider. Make sure you discuss any questions you have with your health care provider. Document Revised: 10/27/2017 Document Reviewed: 10/27/2017 Elsevier Patient Education  2020 [employer].

## 2019-06-25 NOTE — Progress Notes (Signed)
Subjective:    Patient ID: Meagan Chung, female    DOB: 08-Jun-1985, 34 y.o.   MRN: [id number]  Chief Complaint  Patient presents with  . Annual Exam    HPI Patient is in today for annual preventative exam. No recent febrile illness or hospitalizations. 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 Has had her covid shots and on Berna Bue has had very little trouble with her asthma as a result. Denies CP/palp/SOB/HA/congestion/fevers/GI or GU c/o. Taking meds as prescribed  Past Medical History:  Diagnosis Date  . Anxiety and depression    anxiety, bulemia in past all well controlled   . Asthma   . Depression    anxiety, bulemia in past all well controlled  . Diarrhea 11/29/2014  . Hyperlipidemia, mixed 02/02/2015  . Thyroid nodule     Past Surgical History:  Procedure Laterality Date  . ANKLE SURGERY    . COLPOSCOPY  09/2017  . WISDOM TOOTH EXTRACTION Bilateral     Family History  Problem Relation Age of Onset  . Stroke Father        2  . Hyperlipidemia Father   . Allergic rhinitis Father   . Parkinson's disease Father   . Parkinsonism Father   . Anorexia nervosa Sister        47  . Kidney Stones Sister   . Thyroid disease Brother   . Obesity Brother   . Asthma Brother   . Dementia Maternal Grandmother   . Osteoporosis Maternal Grandmother   . Cancer Maternal Grandmother        breast  . Cancer Maternal Grandfather        prostate  . Alcohol abuse Maternal Grandfather   . Cancer Paternal Grandmother 86       3 masses in pancreas and thyroid, suspect cancer  . Hyperlipidemia Paternal Grandmother   . Eczema Mother   . Urticaria Mother   . Immunodeficiency Neg Hx   . Angioedema Neg Hx     Social History   Socioeconomic History  . Marital status: Married    Spouse name: Not on file  . Number of children: 0  . Years of education: Not on file  . Highest education level: Not on file  Occupational History  . Occupation: development   Tobacco Use  . Smoking status: Never Smoker  . Smokeless tobacco: Never Used  Substance and Sexual Activity  . Alcohol use: Yes    Alcohol/week: 0.0 standard drinks    Comment: rarely  . Drug use: No  . 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 Not on file  Social History Narrative  . Not on file   Social Determinants of Health   Financial Resource Strain:   . Difficulty of Paying Living Expenses:   Food Insecurity:   . Worried About Charity fundraiser in the Last Year:   . Arboriculturist in the Last Year:   Transportation Needs:   . Film/video editor (Medical):   Marland Kitchen Lack of Transportation (Non-Medical):   Physical Activity:   . Days of Exercise per Week:   . Minutes of Exercise per Session:   Stress:   . Feeling of Stress :   Social Connections:   . Frequency of Communication with Friends and Family:   . Frequency of Social Gatherings with Friends and Family:   .  Attends Religious Services:   . Active Member of Clubs or Organizations:   . Attends Archivist Meetings:   Marland Kitchen Marital Status:   Intimate Partner Violence:   . Fear of Current or Ex-Partner:   . Emotionally Abused:   Marland Kitchen Physically Abused:   . Sexually Abused:     Outpatient Medications Prior to Visit  Medication Sig Dispense Refill  . albuterol (PROVENTIL) (2.5 MG/3ML) 0.083% nebulizer solution Take 3 mLs (2.5 mg total) by nebulization every 6 (six) hours as needed for wheezing or shortness of breath. 150 mL 1  . albuterol (VENTOLIN HFA) 108 (90 Base) MCG/ACT inhaler Inhale 2 puffs into the lungs every 6 (six) hours as needed for wheezing or shortness of breath. 18 g 1  . citalopram (CELEXA) 20 MG tablet Take 20 mg by mouth daily.    Marland Kitchen EPINEPHrine (AUVI-Q) 0.3 mg/0.3 mL IJ SOAJ injection Inject 0.3 mLs (0.3 mg total) into the muscle as needed for anaphylaxis. 1 each 1  . FASENRA PEN 30 MG/ML  SOAJ INJECT 30MG  SUBCUTANEOUSLY  EVERY 4 WEEKS FOR 3 MONTHS, THEN EVERY 8 WEEKS  THEREAFTER (GIVEN AT MD  OFFICE) 1 mL 11  . valACYclovir (VALTREX) 500 MG tablet Take 500 mg by mouth daily.    . fluticasone (FLOVENT HFA) 110 MCG/ACT inhaler Inhale 2 puffs into the lungs 2 (two) times daily. 1 Inhaler 5  . amoxicillin (AMOXIL) 875 MG tablet Take 1 tablet (875 mg total) by mouth 2 (two) times daily. 20 tablet 0  . azithromycin (ZITHROMAX) 250 MG tablet Take 2 tablets by mouth on day 1, followed by 1 tablet by mouth daily for 4 days. 6 tablet 0  . Benralizumab (FASENRA) 30 MG/ML SOSY Inject 30 mg into the skin every 8 (eight) weeks. 1 Syringe 8  . predniSONE (DELTASONE) 10 MG tablet Take 3 tabs (30mg ) twice daily for 3 days, then 2 tabs (20mg ) twice daily for 3 days, then 1 tab (10mg ) twice daily for 3 days, then STOP. 36 tablet 0  . Benralizumab SOSY 30 mg      No facility-administered medications prior to visit.    Allergies  Allergen Reactions  . Other Cough    Capers causes SOB Cats 4+    Review of Systems  Constitutional: Negative for fever and malaise/fatigue.  HENT: Positive for congestion.   Eyes: Negative for blurred vision.  Respiratory: Negative for shortness of breath.   Cardiovascular: Negative for chest pain, palpitations and leg swelling.  Gastrointestinal: Negative for abdominal pain, blood in stool and nausea.  Genitourinary: Negative for dysuria and frequency.  Musculoskeletal: Negative for falls.  Skin: Negative for rash.  Neurological: Negative for dizziness, loss of consciousness and headaches.  Endo/Heme/Allergies: Negative for environmental allergies.  Psychiatric/Behavioral: Negative for depression. The patient is not nervous/anxious.        Objective:    Physical Exam Constitutional:      General: She is not in acute distress.    Appearance: She is well-developed.  HENT:     Head: Normocephalic and atraumatic.     Right Ear: Tympanic membrane, ear  canal and external ear normal.     Left Ear: Tympanic membrane, ear canal and external ear normal.  Eyes:     General:        Right eye: No discharge.        Left eye: No discharge.     Conjunctiva/sclera: Conjunctivae normal.  Neck:     Thyroid: No thyromegaly.  Cardiovascular:  Rate and Rhythm: Normal rate and regular rhythm.     Heart sounds: Normal heart sounds. No murmur.  Pulmonary:     Effort: Pulmonary effort is normal. No respiratory distress.     Breath sounds: Normal breath sounds.  Abdominal:     General: Bowel sounds are normal. There is no distension.     Palpations: Abdomen is soft. There is no mass.     Tenderness: There is no abdominal tenderness.  Musculoskeletal:        General: Normal range of motion.     Cervical back: Neck supple.     Right lower leg: No edema.     Left lower leg: No edema.  Lymphadenopathy:     Cervical: No cervical adenopathy.  Skin:    General: Skin is warm and dry.  Neurological:     Mental Status: She is alert and oriented to person, place, and time.  Psychiatric:        Behavior: Behavior normal.     BP 108/66 (BP Location: Right Arm, Cuff Size: Large)   Pulse 70   Temp 98.1 F (36.7 C) (Temporal)   Resp 12   Ht 5' 6.5" (1.689 m)   Wt 196 lb (88.9 kg)   LMP 06/22/2019   SpO2 98%   BMI 31.16 kg/m  Wt Readings from Last 3 Encounters:  06/25/19 196 lb (88.9 kg)  04/25/19 197 lb (89.4 kg)  03/08/19 197 lb 3.2 oz (89.4 kg)    Diabetic Foot Exam - Simple   No data filed     Lab Results  Component Value Date   WBC CANCELED 04/08/2017   HGB 13.3 06/14/2016   HCT 40.2 06/14/2016   PLT 280.0 06/14/2016   GLUCOSE 83 10/24/2017   CHOL 167 10/24/2017   TRIG 57.0 10/24/2017   HDL 43.50 10/24/2017   LDLCALC 112 (H) 10/24/2017   ALT 12 10/24/2017   AST 12 10/24/2017   NA 141 10/24/2017   K 4.1 10/24/2017   CL 106 10/24/2017   CREATININE 0.83 10/24/2017   BUN 13 10/24/2017   CO2 27 10/24/2017   TSH 0.87  06/16/2017   HGBA1C 5.2 01/31/2015    Lab Results  Component Value Date   TSH 0.87 06/16/2017   Lab Results  Component Value Date   WBC CANCELED 04/08/2017   HGB 13.3 06/14/2016   HCT 40.2 06/14/2016   MCV 87.3 06/14/2016   PLT 280.0 06/14/2016   Lab Results  Component Value Date   NA 141 10/24/2017   K 4.1 10/24/2017   CO2 27 10/24/2017   GLUCOSE 83 10/24/2017   BUN 13 10/24/2017   CREATININE 0.83 10/24/2017   BILITOT 0.6 10/24/2017   ALKPHOS 68 10/24/2017   AST 12 10/24/2017   ALT 12 10/24/2017   PROT 6.8 10/24/2017   ALBUMIN 4.3 10/24/2017   CALCIUM 9.5 10/24/2017   ANIONGAP 7 11/11/2014   GFR 84.00 10/24/2017   Lab Results  Component Value Date   CHOL 167 10/24/2017   Lab Results  Component Value Date   HDL 43.50 10/24/2017   Lab Results  Component Value Date   LDLCALC 112 (H) 10/24/2017   Lab Results  Component Value Date   TRIG 57.0 10/24/2017   Lab Results  Component Value Date   CHOLHDL 4 10/24/2017   Lab Results  Component Value Date   HGBA1C 5.2 01/31/2015       Assessment & Plan:   Problem List Items Addressed This Visit  Severe persistent asthma    Berna Bue has made a remarkable difference. Even with her 84 year old daughter in [place] she has not needed her inhalers      Acute sinusitis - Primary    Her ENT Dr Blenda Nicely has recommended surgery and she is in the process of getting a second opinion with Dr Vennie Homans at [facility] before she proceeds with surgery      Relevant Medications   valACYclovir (VALTREX) 500 MG tablet   Preventative health care    Patient encouraged to maintain heart healthy diet, regular exercise, adequate sleep. Consider daily probiotics. Take medications as prescribed. Labs ordered and referred.       Relevant Orders   CBC   Comprehensive metabolic panel   Lipid panel   TSH      I have discontinued Meagan Chung. Meagan "Kate"'s amoxicillin, Flovent HFA, predniSONE, and  azithromycin. I am also having her maintain her albuterol, citalopram, albuterol, EPINEPHrine, Fasenra Pen, and valACYclovir. We will stop administering Benralizumab.  No orders of the defined types were placed in this encounter.    Penni Homans, MD

## 2019-06-25 NOTE — Assessment & Plan Note (Signed)
Her ENT Dr Blenda Nicely has recommended surgery and she is in the process of getting a second opinion with Dr Vennie Homans at [facility] before she proceeds with surgery

## 2019-06-25 NOTE — Assessment & Plan Note (Addendum)
Meagan Chung has made a remarkable difference. Even with her 35 year old daughter in [place] she has not needed her inhalers

## 2019-06-26 ENCOUNTER — Encounter: Payer: Self-pay | Admitting: *Deleted

## 2019-09-06 ENCOUNTER — Emergency Department (INDEPENDENT_AMBULATORY_CARE_PROVIDER_SITE_OTHER)
Admission: EM | Admit: 2019-09-06 | Discharge: 2019-09-06 | Disposition: A | Discharge status: Home / Self Care | Payer: Commercial Managed Care - PPO | Source: Home / Self Care

## 2019-09-06 ENCOUNTER — Emergency Department: Admit: 2019-09-06 | Discharge status: Absent without leave | Payer: Self-pay

## 2019-09-06 ENCOUNTER — Other Ambulatory Visit: Payer: Self-pay

## 2019-09-06 DIAGNOSIS — J0101 Acute recurrent maxillary sinusitis: Secondary | ICD-10-CM | POA: Diagnosis not present

## 2019-09-06 MED ORDER — DOXYCYCLINE HYCLATE 100 MG PO CAPS: 100.0000 mg | ORAL_CAPSULE | Freq: Two times a day (BID) | ORAL | 0 refills | Status: AC

## 2019-09-06 MED ORDER — PREDNISONE 20 MG PO TABS: ORAL_TABLET | ORAL | 0 refills | Status: DC

## 2019-09-06 NOTE — ED Provider Notes (Signed)
[facility]    CSN: [id number] Arrival date & time: 09/06/19  0907      History   Chief Complaint Chief Complaint  Patient presents with  . Nasal Congestion  . Fever    low grade    HPI Melanni Benway is a 34 y.o. female.   HPI Amyiah Gaba is a 34 y.o. female presenting to UC with c/o 4 days worsening sinus congestion with pain and pressure, pain radiating into her top teeth, aching and sore. Nasal discharge is thick and green, low grade fevers. Hx of recurrent sinus infections. Sinus surgery has been recommended by her ENT but pt is putting it off until her PCP is back from maternity leave, pt is hesitant to get the procedure.  Denies n/v/d. No sick contacts. She has received the Covid-19 vaccine.    Past Medical History:  Diagnosis Date  . Anxiety and depression    anxiety, bulemia in past all well controlled   . Asthma   . Depression    anxiety, bulemia in past all well controlled  . Diarrhea 11/29/2014  . Hyperlipidemia, mixed 02/02/2015  . Thyroid nodule     Patient Active Problem List   Diagnosis Date Noted  . Preventative health care 06/25/2019  . Moderate persistent asthma without complication 81/03/7508  . Acute sinusitis 03/08/2019  . Severe persistent asthma 05/15/2018  . Recurrent infections 04/08/2017  . Seasonal and perennial allergic rhinitis 04/08/2017  . Chronic asthma without complication 25/85/2778  . Anxiety and depression   . Thyroid nodule   . Esophageal reflux   . Hypokalemia   . Asthma 11/10/2014  . Irritable bowel syndrome 12/19/2012  . Insomnia 12/19/2012  . History of bulimia 12/19/2012  . 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 Hashimoto's thyroiditis 01/06/2011    Past Surgical History:  Procedure Laterality Date  . ANKLE SURGERY    . COLPOSCOPY  09/2017  . WISDOM TOOTH EXTRACTION Bilateral     OB History    Gravida  1   Para  1   Term  1   Preterm      AB      Living  1     SAB        TAB      Ectopic      Multiple  0   Live Births  1            Home Medications    Prior to Admission medications   Medication Sig Start Date End Date Taking? Authorizing Provider  albuterol (PROVENTIL) (2.5 MG/3ML) 0.083% nebulizer solution Take 3 mLs (2.5 mg total) by nebulization every 6 (six) hours as needed for wheezing or shortness of breath. 02/15/17   Colon Branch, MD  albuterol (VENTOLIN HFA) 108 (90 Base) MCG/ACT inhaler Inhale 2 puffs into the lungs every 6 (six) hours as needed for wheezing or shortness of breath. 03/08/19   Dara Hoyer, FNP  citalopram (CELEXA) 20 MG tablet Take 20 mg by mouth daily.    [provider]  doxycycline (VIBRAMYCIN) 100 MG capsule Take 1 capsule (100 mg total) by mouth 2 (two) times daily for 7 days. 09/06/19 09/13/19  Noe Gens, PA-C  EPINEPHrine (AUVI-Q) 0.3 mg/0.3 mL IJ SOAJ injection Inject 0.3 mLs (0.3 mg total) into the muscle as needed for anaphylaxis. 03/09/19   Ambs, Kathrine Cords, FNP  FASENRA PEN 30 MG/ML SOAJ INJECT 30MG  SUBCUTANEOUSLY  EVERY 4 WEEKS FOR 3 MONTHS,  THEN EVERY 8 WEEKS  THEREAFTER (GIVEN AT MD  OFFICE) 04/23/19   [name], MD  predniSONE (DELTASONE) 20 MG tablet 3 tabs po day one, then 2 po daily x 4 days 09/06/19   Noe Gens, PA-C  valACYclovir (VALTREX) 500 MG tablet Take 500 mg by mouth daily.    [provider]    Family History Family History  Problem Relation Age of Onset  . Stroke Father        2  . Hyperlipidemia Father   . Allergic rhinitis Father   . Parkinson's disease Father   . Parkinsonism Father   . Anorexia nervosa Sister        70  . Kidney Stones Sister   . Thyroid disease Brother   . Obesity Brother   . Asthma Brother   . Dementia Maternal Grandmother   . Osteoporosis Maternal Grandmother   . Cancer Maternal Grandmother        breast  . Cancer Maternal Grandfather        prostate  . Alcohol abuse Maternal Grandfather   . Cancer Paternal Grandmother 65        3 masses in pancreas and thyroid, suspect cancer  . Hyperlipidemia Paternal Grandmother   . Eczema Mother   . Urticaria Mother   . Immunodeficiency Neg Hx   . Angioedema Neg Hx     Social History Social History   Tobacco Use  . Smoking status: Never Smoker  . Smokeless tobacco: Never Used  Vaping Use  . Vaping Use: Never used  Substance Use Topics  . Alcohol use: Yes    Alcohol/week: 0.0 standard drinks    Comment: rarely  . Drug use: No     Allergies   Other   Review of Systems Review of Systems  Constitutional: Positive for fever. Negative for chills.  HENT: Positive for congestion, sinus pressure and sinus pain. Negative for ear pain, sore throat, trouble swallowing and voice change.   Respiratory: Negative for cough and shortness of breath.   Cardiovascular: Negative for chest pain and palpitations.  Gastrointestinal: Negative for abdominal pain, diarrhea, nausea and vomiting.  Musculoskeletal: Negative for arthralgias, back pain and myalgias.  Skin: Negative for rash.  Neurological: Positive for headaches (frontal).  All other systems reviewed and are negative.    Physical Exam Triage Vital Signs ED Triage Vitals  Enc Vitals Group     BP 09/06/19 0924 112/66     Pulse Rate 09/06/19 0924 86     Resp 09/06/19 0924 18     Temp 09/06/19 0924 98.5 F (36.9 C)     Temp Source 09/06/19 0924 Oral     SpO2 09/06/19 0924 99 %     Weight --      Height --      Head Circumference --      Peak Flow --      Pain Score 09/06/19 0925 0     Pain Loc --      Pain Edu? --      Excl. in [place]? --    No data found.  Updated Vital Signs BP 112/66 (BP Location: Right Arm)   Pulse 86   Temp 98.5 F (36.9 C) (Oral)   Resp 18   LMP 08/25/2019 (Approximate)   SpO2 99%   Visual Acuity Right Eye Distance:   Left Eye Distance:   Bilateral Distance:    Right Eye Near:   Left Eye Near:  Bilateral Near:     Physical Exam Vitals and nursing note reviewed.   Constitutional:      General: She is not in acute distress.    Appearance: Normal appearance. She is well-developed. She is not ill-appearing, toxic-appearing or diaphoretic.  HENT:     Head: Normocephalic and atraumatic.     Right Ear: Tympanic membrane and ear canal normal.     Left Ear: Tympanic membrane and ear canal normal.     Nose: Mucosal edema present.     Right Sinus: Maxillary sinus tenderness present. No frontal sinus tenderness.     Left Sinus: Maxillary sinus tenderness present. No frontal sinus tenderness.     Mouth/Throat:     Lips: Pink.     Mouth: Mucous membranes are moist.     Pharynx: Oropharynx is clear. Uvula midline.  Cardiovascular:     Rate and Rhythm: Normal rate and regular rhythm.  Pulmonary:     Effort: Pulmonary effort is normal. No respiratory distress.     Breath sounds: Normal breath sounds. No stridor. No wheezing, rhonchi or rales.  Musculoskeletal:        General: Normal range of motion.     Cervical back: Normal range of motion.  Skin:    General: Skin is warm and dry.  Neurological:     Mental Status: She is alert and oriented to person, place, and time.  Psychiatric:        Behavior: Behavior normal.      UC Treatments / Results  Labs (all labs ordered are listed, but only abnormal results are displayed) Labs Reviewed - No data to display  EKG   Radiology No results found.  Procedures Procedures (including critical care time)  Medications Ordered in UC Medications - No data to display  Initial Impression / Assessment and Plan / UC Course  I have reviewed the triage vital signs and the nursing notes.  Pertinent labs & imaging results that were available during my care of the patient were reviewed by me and considered in my medical decision making (see chart for details).     Will tx for recurrent sinusitis with doxycycline and prednisone Pt was on amoxicillin and azithromycin within the last 3-4 months. F/u with  PCP AVS given  Final Clinical Impressions(s) / UC Diagnoses   Final diagnoses:  Recurrent maxillary sinusitis     Discharge Instructions      Please take antibiotics as prescribed and be sure to complete entire course even if you start to feel better to ensure infection does not come back.  You may take 500mg  acetaminophen every 4-6 hours or in combination with ibuprofen 400-600mg  every 6-8 hours as needed for pain, inflammation, and fever.  Be sure to well hydrated with clear liquids and get at least 8 hours of sleep at night, preferably more while sick.   Please follow up with family medicine in 1 week if needed.     ED Prescriptions    Medication Sig Dispense Auth. Provider   doxycycline (VIBRAMYCIN) 100 MG capsule Take 1 capsule (100 mg total) by mouth 2 (two) times daily for 7 days. 14 capsule Gerarda Fraction, Raghad Lorenz O, PA-C   predniSONE (DELTASONE) 20 MG tablet 3 tabs po day one, then 2 po daily x 4 days 11 tablet Noe Gens, [place]     PDMP not reviewed this encounter.   Noe Gens, [place] 09/06/19 1327

## 2019-09-06 NOTE — Discharge Instructions (Signed)

## 2019-09-06 NOTE — ED Triage Notes (Signed)
Pt c/o nasal congestion since Monday. Started as sore throat, then nasal congestion. Green mucous and low grade fever. (not more than 100) Tylenol cold and sinus prn. Afrin ans sinus rinses prn as well.

## 2019-09-12 ENCOUNTER — Other Ambulatory Visit: Payer: Self-pay | Admitting: Family Medicine

## 2019-09-12 ENCOUNTER — Encounter: Payer: Self-pay | Admitting: Family Medicine

## 2019-09-12 MED ORDER — FLUCONAZOLE 150 MG PO TABS: 150.0000 mg | ORAL_TABLET | ORAL | 1 refills | Status: DC

## 2019-09-20 ENCOUNTER — Other Ambulatory Visit: Payer: Self-pay

## 2019-09-20 ENCOUNTER — Ambulatory Visit (INDEPENDENT_AMBULATORY_CARE_PROVIDER_SITE_OTHER): Payer: Commercial Managed Care - PPO | Admitting: Medical

## 2019-09-20 VITALS — BP 110/82 | HR 77 | Temp 97.9°F | Resp 20 | Ht 66.0 in | Wt 193.4 lb

## 2019-09-20 DIAGNOSIS — J019 Acute sinusitis, unspecified: Secondary | ICD-10-CM

## 2019-09-20 MED ORDER — BENZONATATE 100 MG PO CAPS: 100.0000 mg | ORAL_CAPSULE | Freq: Three times a day (TID) | ORAL | 0 refills | Status: DC | PRN

## 2019-09-20 MED ORDER — CEFTRIAXONE SODIUM 1 G IJ SOLR
1.0000 g | Freq: Once | INTRAMUSCULAR | Status: AC
Start: 2019-09-20 — End: 2019-09-20
  Administered 2019-09-20: 1 g via INTRAMUSCULAR

## 2019-09-20 MED ORDER — PREDNISONE 10 MG (21) PO TBPK
ORAL_TABLET | ORAL | 0 refills | Status: DC
Start: 2019-09-20 — End: 2020-04-01

## 2019-09-20 MED ORDER — CLINDAMYCIN HCL 150 MG PO CAPS
150.0000 mg | ORAL_CAPSULE | Freq: Three times a day (TID) | ORAL | 0 refills | Status: DC
Start: 2019-09-20 — End: 2020-04-01

## 2019-09-20 NOTE — Progress Notes (Signed)
Subjective:    Patient ID: Meagan Chung, female    DOB: 04-May-1985, 34 y.o.   MRN: [id number]  HPI  Pt in for sinus pressure. She has been vaccinated against covid. Got phizer.  Pt states around July 4 th her symptoms started. On 5th she was given doxcycycllne and taper dose prednisone. She states tx for sinus infection. Finished tx and felt better. Then one week later her symptoms returned.   Pt has hx of 8 infection in pat 9 months.  Pt does have upcoming with ENT on Oct 11, 2019. Pt was told by one ent she needed surgery for sinuses. Getting second opinion.  Pt also coughing up colored mucus.  Pt feel slight wheeze. Hx of asthma but states not having flare.   LMP- At end of June around 30th. On ocp.     Review of Systems  Constitutional: Negative for chills, fatigue and fever.  HENT: Positive for congestion, sinus pressure, sinus pain and sore throat.   Respiratory: Positive for cough. Negative for chest tightness, shortness of breath and wheezing.   Cardiovascular: Negative for chest pain and palpitations.  Gastrointestinal: Negative for abdominal pain and constipation.  Genitourinary: Negative for dysuria.  Musculoskeletal: Negative for back pain.  Skin: Negative for rash.  Neurological: Negative for dizziness, weakness and numbness.  Hematological: Negative for adenopathy. Does not bruise/bleed easily.  Psychiatric/Behavioral: Negative for behavioral problems and dysphoric mood.    Past Medical History:  Diagnosis Date  . Anxiety and depression    anxiety, bulemia in past all well controlled   . Asthma   . Depression    anxiety, bulemia in past all well controlled  . Diarrhea 11/29/2014  . Hyperlipidemia, mixed 02/02/2015  . Thyroid nodule      Social History   Socioeconomic History  . Marital status: Married    Spouse name: Not on file  . Number of children: 0  . Years of education: Not on file  . Highest education level: Not on file    Occupational History  . Occupation: development  Tobacco Use  . Smoking status: Never Smoker  . Smokeless tobacco: Never Used  Vaping Use  . Vaping Use: Never used  Substance and Sexual Activity  . Alcohol use: Yes    Alcohol/week: 0.0 standard drinks    Comment: rarely  . Drug use: No  . 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 Not on file  Social History Narrative  . Not on file   Social Determinants of Health   Financial Resource Strain:   . Difficulty of Paying Living Expenses:   Food Insecurity:   . Worried About Charity fundraiser in the Last Year:   . Arboriculturist in the Last Year:   Transportation Needs:   . Film/video editor (Medical):   Marland Kitchen Lack of Transportation (Non-Medical):   Physical Activity:   . Days of Exercise per Week:   . Minutes of Exercise per Session:   Stress:   . Feeling of Stress :   Social Connections:   . Frequency of Communication with Friends and Family:   . Frequency of Social Gatherings with Friends and Family:   . Attends Religious Services:   . Active Member of Clubs or Organizations:   . Attends Archivist Meetings:   Marland Kitchen Marital Status:   Intimate Partner Violence:   .  Fear of Current or Ex-Partner:   . Emotionally Abused:   Marland Kitchen Physically Abused:   . Sexually Abused:     Past Surgical History:  Procedure Laterality Date  . ANKLE SURGERY    . COLPOSCOPY  09/2017  . WISDOM TOOTH EXTRACTION Bilateral     Family History  Problem Relation Age of Onset  . Stroke Father        2  . Hyperlipidemia Father   . Allergic rhinitis Father   . Parkinson's disease Father   . Parkinsonism Father   . Anorexia nervosa Sister        87  . Kidney Stones Sister   . Thyroid disease Brother   . Obesity Brother   . Asthma Brother   . Dementia Maternal Grandmother   . Osteoporosis Maternal Grandmother   . Cancer  Maternal Grandmother        breast  . Cancer Maternal Grandfather        prostate  . Alcohol abuse Maternal Grandfather   . Cancer Paternal Grandmother 33       3 masses in pancreas and thyroid, suspect cancer  . Hyperlipidemia Paternal Grandmother   . Eczema Mother   . Urticaria Mother   . Immunodeficiency Neg Hx   . Angioedema Neg Hx     Allergies  Allergen Reactions  . Other Cough    Capers causes SOB Cats 4+    Current Outpatient Medications on File Prior to Visit  Medication Sig Dispense Refill  . albuterol (PROVENTIL) (2.5 MG/3ML) 0.083% nebulizer solution Take 3 mLs (2.5 mg total) by nebulization every 6 (six) hours as needed for wheezing or shortness of breath. 150 mL 1  . albuterol (VENTOLIN HFA) 108 (90 Base) MCG/ACT inhaler Inhale 2 puffs into the lungs every 6 (six) hours as needed for wheezing or shortness of breath. 18 g 1  . citalopram (CELEXA) 20 MG tablet Take 20 mg by mouth daily.    Marland Kitchen EPINEPHrine (AUVI-Q) 0.3 mg/0.3 mL IJ SOAJ injection Inject 0.3 mLs (0.3 mg total) into the muscle as needed for anaphylaxis. 1 each 1  . FASENRA PEN 30 MG/ML SOAJ INJECT 30MG  SUBCUTANEOUSLY  EVERY 4 WEEKS FOR 3 MONTHS, THEN EVERY 8 WEEKS  THEREAFTER (GIVEN AT MD  OFFICE) 1 mL 11  . valACYclovir (VALTREX) 500 MG tablet Take 500 mg by mouth daily.    . fluconazole (DIFLUCAN) 150 MG tablet Take 1 tablet (150 mg total) by mouth once a week. (Patient not taking: Reported on 09/20/2019) 2 tablet 1  . predniSONE (DELTASONE) 20 MG tablet 3 tabs po day one, then 2 po daily x 4 days (Patient not taking: Reported on 09/20/2019) 11 tablet 0   No current facility-administered medications on file prior to visit.    BP 110/82   Pulse 77   Temp 97.9 F (36.6 C) (Oral)   Resp 20   Ht 5\' 6"  (1.676 m)   Wt 193 lb 6.4 oz (87.7 kg)   LMP 08/25/2019 (Approximate)   SpO2 98%   BMI 31.22 kg/m       Objective:   Physical Exam   General  Mental Status - Alert. General Appearance - Well  groomed. Not in acute distress.  Skin Rashes- No Rashes.  HEENT Head- Normal. Ear Auditory Canal - Left- Normal. Right - Normal.Tympanic Membrane- Left- Normal. Right- Normal. Eye Sclera/Conjunctiva- Left- Normal. Right- Normal. Nose & Sinuses Nasal Mucosa- Left-  Boggy and Congested. Right-  Boggy and  Congested.Bilateral maxillary  and frontal sinus pressure. Mouth & Throat Lips: Upper Lip- Normal: no dryness, cracking, pallor, cyanosis, or vesicular eruption. Lower Lip-Normal: no dryness, cracking, pallor, cyanosis or vesicular eruption. Buccal Mucosa- Bilateral- No Aphthous ulcers. Oropharynx- No Discharge or Erythema. Tonsils: Characteristics- Bilateral- No Erythema or Congestion. Size/Enlargement- Bilateral- No enlargement. Discharge- bilateral-None.  Neck Neck- Supple. No Masses.   Chest and Lung Exam Auscultation: Breath Sounds:-Clear even and unlabored.  Cardiovascular Auscultation:Rythm- Regular, rate and rhythm. Murmurs & Other Heart Sounds:Ausculatation of the heart reveal- No Murmurs.  Lymphatic Head & Neck General Head & Neck Lymphatics: Bilateral: Description- No Localized lymphadenopathy.      Assessment & Plan:  Your appear to have a sinus infection. I am prescribing antibiotic for the infection. We gave you rocephin 1 gram IM. Rx clindamycin today. Rx advisement given. supplament with probiotics   For your associated cough, I prescribed cough medicine benzonatate.  Also 6 day taper prednisone as you have chronic sinus infections. Possible allergic component.  Lungs clear today. Use albuterol if needed due to hx of asthma.  Rest, hydrate, tylenol for fever.  Follow up in 7 days or as needed.  Time spent with patient today was 23  minutes which consisted of chart review, discussing diagnosis,  Treatment,  and documentation.  Mackie Pai, PA-C

## 2019-09-20 NOTE — Patient Instructions (Addendum)
Your appear to have a sinus infection. I am prescribing antibiotic for the infection. We gave you rocephin 1 gram IM. Rx clindamycin today. Rx advisement given. supplement with probiotics   For your associated cough, I prescribed cough medicine benzonatate.  Also 6 day taper prednisone as you have chronic sinus infections. Possible allergic component.  Lungs clear today. Use albuterol if needed due to hx of asthma.  Rest, hydrate, tylenol for fever.  Follow up in 7 days or as needed.

## 2019-09-20 NOTE — Addendum Note (Signed)
Addended by: Wynonia Musty A on: 09/20/2019 10:33 AM   Modules accepted: Orders

## 2019-10-11 ENCOUNTER — Other Ambulatory Visit: Payer: Self-pay | Admitting: Family Medicine

## 2019-10-11 DIAGNOSIS — F419 Anxiety disorder, unspecified: Secondary | ICD-10-CM

## 2019-10-11 DIAGNOSIS — F32A Depression, unspecified: Secondary | ICD-10-CM

## 2019-10-11 DIAGNOSIS — F329 Major depressive disorder, single episode, unspecified: Secondary | ICD-10-CM

## 2019-10-12 NOTE — Telephone Encounter (Signed)
Request from pharmacy. This medication had been discontinued.   Can the patient receive a refill on this medication.  Last office visit- 06-25-2019

## 2019-10-12 NOTE — Telephone Encounter (Signed)
So am confused doe the patient want a refill or did the pharmacy just request an automatic refill. Am willing to refill as long patient wants it.

## 2019-10-15 NOTE — Telephone Encounter (Signed)
Last OV---06/15/19 Next OV--06/30/2020 Last RF---09/04/2018---#20 with 1 refill UDS--10/24/2017

## 2019-11-15 ENCOUNTER — Encounter: Payer: Self-pay | Admitting: Allergy & Immunology

## 2019-11-16 ENCOUNTER — Telehealth: Payer: Self-pay

## 2019-11-16 NOTE — Telephone Encounter (Signed)
[name], Meagan Katharine "Kate"  Chung, Joel Louis, MD Yesterday (10:13 AM)   I've met my deductible and would like to take advantage of it and go ahead and resume my fasenra injections.  I had been administering myself before the benefit ran out and the cost was too much. Can I start back on them?   

## 2019-11-16 NOTE — Telephone Encounter (Signed)
Yes we can restart the Berna Bue I believe.  I will route to Tammy to get it back on track.  Salvatore Marvel, MD [facility]

## 2019-11-19 ENCOUNTER — Other Ambulatory Visit: Payer: Self-pay | Admitting: Allergy & Immunology

## 2019-11-19 NOTE — Telephone Encounter (Signed)
Called patient and advised that she has current PA on file and can reach out to [facility] to order and number given

## 2019-11-19 NOTE — Telephone Encounter (Signed)
Please advise 

## 2019-11-21 NOTE — Telephone Encounter (Signed)
Great! Thanks for taking care of this!   Salvatore Marvel, MD [facility]

## 2019-11-25 ENCOUNTER — Ambulatory Visit: Payer: Self-pay

## 2019-11-26 ENCOUNTER — Other Ambulatory Visit: Payer: Self-pay | Admitting: Family Medicine

## 2019-11-26 ENCOUNTER — Encounter: Payer: Self-pay | Admitting: Family Medicine

## 2019-11-26 NOTE — Telephone Encounter (Signed)
Can we put her for 940am Wednesday if she wants it?  The 820am for Wed was suppose to be a 920am and was changed to that spot.

## 2019-11-27 NOTE — Telephone Encounter (Signed)
Left message on machine to call back to schedule appt for Friday/

## 2019-12-03 ENCOUNTER — Ambulatory Visit: Payer: Commercial Managed Care - PPO | Admitting: Medical

## 2019-12-27 ENCOUNTER — Other Ambulatory Visit: Payer: Self-pay | Admitting: Family Medicine

## 2019-12-27 DIAGNOSIS — F32A Depression, unspecified: Secondary | ICD-10-CM

## 2019-12-27 DIAGNOSIS — F419 Anxiety disorder, unspecified: Secondary | ICD-10-CM

## 2019-12-28 NOTE — Telephone Encounter (Signed)
Last written: 10/15/19 Last ov: 02/15/19 Next ov: 06/30/20 Contract: none UDS: 10/14/17

## 2019-12-28 NOTE — Telephone Encounter (Signed)
I am going to send her in #10 Alprazolam but I have not seen her since December so she needs an appt if she needs any further doses.

## 2020-03-19 ENCOUNTER — Other Ambulatory Visit: Payer: Self-pay | Admitting: Family Medicine

## 2020-03-19 DIAGNOSIS — F419 Anxiety disorder, unspecified: Secondary | ICD-10-CM

## 2020-03-19 DIAGNOSIS — F32A Depression, unspecified: Secondary | ICD-10-CM

## 2020-03-19 MED ORDER — ALPRAZOLAM 0.25 MG PO TABS
ORAL_TABLET | ORAL | 1 refills | Status: DC
Start: 2020-03-19 — End: 2020-11-04

## 2020-03-19 NOTE — Telephone Encounter (Signed)
Dr. Charlett Blake- can you resend please? Transmission failed to pharmacy.

## 2020-03-19 NOTE — Addendum Note (Signed)
Addended byDamita Dunnings D on: 03/19/2020 12:40 PM   Modules accepted: Orders

## 2020-03-19 NOTE — Telephone Encounter (Signed)
Requesting: alprazolam 0.25mg  Contract: None UDS: 10/24/2017 Last Visit: 09/20/2019 w/ Percell Miller  Next Visit: 06/30/2020 Last Refill: 12/28/2019 #10 and 0RF  Please Advise

## 2020-04-01 ENCOUNTER — Other Ambulatory Visit: Payer: Self-pay

## 2020-04-01 ENCOUNTER — Encounter: Payer: Self-pay | Admitting: Internal Medicine

## 2020-04-01 ENCOUNTER — Telehealth (INDEPENDENT_AMBULATORY_CARE_PROVIDER_SITE_OTHER): Payer: Commercial Managed Care - PPO | Admitting: Internal Medicine

## 2020-04-01 VITALS — Temp 97.6°F | Ht 66.0 in | Wt 175.0 lb

## 2020-04-01 DIAGNOSIS — J4541 Moderate persistent asthma with (acute) exacerbation: Secondary | ICD-10-CM | POA: Diagnosis not present

## 2020-04-01 MED ORDER — PREDNISONE 10 MG PO TABS: ORAL_TABLET | ORAL | 0 refills | Status: DC

## 2020-04-01 NOTE — Progress Notes (Signed)
Subjective:    Patient ID: Meagan Chung, female    DOB: 19-Mar-1985, 35 y.o.   MRN: [id number]  DOS:  04/01/2020 Type of visit - description: Virtual Visit via Video Note  I connected with the above patient  by a video enabled telemedicine application and verified that I am speaking with the correct person using two identifiers.   THIS ENCOUNTER IS A VIRTUAL VISIT DUE TO COVID-19 - PATIENT WAS NOT SEEN IN THE OFFICE. PATIENT HAS CONSENTED TO VIRTUAL VISIT / TELEMEDICINE VISIT   Location of patient: home  Location of provider: office  Persons participating in the virtual visit: patient, provider   I discussed the limitations of evaluation and management by telemedicine and the availability of in person appointments. The patient expressed understanding and agreed to proceed.  Acute The patient has a long history of sinus problems. She was doing okay until a week ago when she developed sinus congestion and cough. Symptoms increased for the last 2 days. She has increased sinus congestion with thick green nasal discharge Cough increased, some wheezing, + sputum, also green and thick.   She had + subjective fevers. No chest pain or shortness of breath per se but again some increased wheezing. No myalgias, no nausea or vomiting. No headache just sinus pressure.   Review of Systems See above   Past Medical History:  Diagnosis Date  . Anxiety and depression    anxiety, bulemia in past all well controlled   . Asthma   . Depression    anxiety, bulemia in past all well controlled  . Diarrhea 11/29/2014  . Hyperlipidemia, mixed 02/02/2015  . Thyroid nodule     Past Surgical History:  Procedure Laterality Date  . ANKLE SURGERY    . COLPOSCOPY  09/2017  . WISDOM TOOTH EXTRACTION Bilateral     Allergies as of 04/01/2020      Reactions   Other Cough   Capers causes SOB Cats 4+      Medication List       Accurate as of April 01, 2020  6:49 PM. If you have any  questions, ask your nurse or doctor.        STOP taking these medications   benzonatate 100 MG capsule Commonly known as: TESSALON Stopped by: Kathlene November, MD   clindamycin 150 MG capsule Commonly known as: Cleocin Stopped by: Kathlene November, MD   fluconazole 150 MG tablet Commonly known as: DIFLUCAN Stopped by: Kathlene November, MD     TAKE these medications   albuterol (2.5 MG/3ML) 0.083% nebulizer solution Commonly known as: PROVENTIL Take 3 mLs (2.5 mg total) by nebulization every 6 (six) hours as needed for wheezing or shortness of breath.   albuterol 108 (90 Base) MCG/ACT inhaler Commonly known as: Ventolin HFA Inhale 2 puffs into the lungs every 6 (six) hours as needed for wheezing or shortness of breath.   ALPRAZolam 0.25 MG tablet Commonly known as: XANAX TAKE 1 TABLET(0.25 MG) BY MOUTH TWICE DAILY AS NEEDED FOR ANXIETY   citalopram 20 MG tablet Commonly known as: CELEXA Take 20 mg by mouth daily.   EPINEPHrine 0.3 mg/0.3 mL Soaj injection Commonly known as: Auvi-Q Inject 0.3 mLs (0.3 mg total) into the muscle as needed for anaphylaxis.   Fasenra Pen 30 MG/ML Soaj Generic drug: Benralizumab INJECT 30MG  SUBCUTANEOUSLY  EVERY 4 WEEKS FOR 3 MONTHS, THEN EVERY 8 WEEKS  THEREAFTER (GIVEN AT MD  OFFICE)   Fasenra 30 MG/ML Sosy Generic drug: Benralizumab INJECT  30MG  SUBCUTANEOUSLY  EVERY 8 WEEKS (GIVEN AT MD  OFFICE)   predniSONE 10 MG tablet Commonly known as: DELTASONE 3 tabs x 3 days, 2 tabs x 3 days, 1 tab x 3 days What changed:   medication strength  additional instructions  Another medication with the same name was removed. Continue taking this medication, and follow the directions you see here. Changed by: Kathlene November, MD   valACYclovir 500 MG tablet Commonly known as: VALTREX Take 500 mg by mouth daily.          Objective:   Physical Exam Temp 97.6 F (36.4 C) (Temporal)   Ht 5\' 6"  (1.676 m)   Wt 175 lb (79.4 kg)   BMI 28.25 kg/m  This is a virtual  video visit, she looks well in no distress.  Speaking in complete sentences    Assessment      35 year old female with a history of asthma, s/p  admission to ICU due to pneumonia in 2016, GERD on birth control pills (not on her medication list), h/o depression/anxiety presents with    Asthma exacerbation, chronic sinusitis exacerbation: The patient has a long history of sinus issues, reports sinus surgery 10-2019 ; this would be her third or fourth exacerbation since the surgery that requires consult. She is triple vaccinated, had a flu shot, had a negative Covid test last week and yesterday. She has expressed concerns about getting C diff w/ frequent antibiotics prescription and I completely agree with her concerns. Plan: Try a round of prednisone and only prescribe antibiotics if she is not better. Continue OTCs for cough control and she will do some nasal rinses with steroids which she already has. She verbalized understanding, a message was sent Encouraged to discuss long-term strategies to prevent frequent exacerbations with ENT and her allergist    I discussed the assessment and treatment plan with the patient. The patient was provided an opportunity to ask questions and all were answered. The patient agreed with the plan and demonstrated an understanding of the instructions.   The patient was advised to call back or seek an in-person evaluation if the symptoms worsen or if the condition fails to improve as anticipated.

## 2020-05-02 ENCOUNTER — Other Ambulatory Visit: Payer: Self-pay

## 2020-05-02 ENCOUNTER — Ambulatory Visit (INDEPENDENT_AMBULATORY_CARE_PROVIDER_SITE_OTHER): Payer: Commercial Managed Care - PPO | Admitting: Internal Medicine

## 2020-05-02 VITALS — BP 108/76 | HR 76 | Temp 97.7°F | Ht 66.0 in | Wt 183.0 lb

## 2020-05-02 DIAGNOSIS — N390 Urinary tract infection, site not specified: Secondary | ICD-10-CM | POA: Diagnosis not present

## 2020-05-02 LAB — POCT URINE PREGNANCY: Preg Test, Ur: NEGATIVE

## 2020-05-02 MED ORDER — SULFAMETHOXAZOLE-TRIMETHOPRIM 800-160 MG PO TABS: 1.0000 | ORAL_TABLET | Freq: Two times a day (BID) | ORAL | 0 refills | Status: DC

## 2020-05-02 NOTE — Patient Instructions (Signed)
Start Bactrim, an antibiotic for 5 days  Drink plenty of fluids  If you are not gradually better let us know  If you develop moderate to severe pain on the right side call the office.

## 2020-05-02 NOTE — Progress Notes (Signed)
Subjective:    Patient ID: Meagan Chung, female    DOB: Feb 12, 1986, 35 y.o.   MRN: [id number]  DOS:  05/02/2020 Type of visit - description: Acute Woke up today and immediately noted dysuria, urinary frequency, a feeling that he could not empty the bladder. Also suprapubic pressure. Took Azo-Standard OTC: No help.  Denies blood in the urine.  She stops BCPs 2 weeks ago, still had no menses.  Her partner has a vasectomy.   Review of Systems No fever chills No flank pain No nausea vomiting No vaginal discharge or bleeding.  Past Medical History:  Diagnosis Date  . Anxiety and depression    anxiety, bulemia in past all well controlled   . Asthma   . Depression    anxiety, bulemia in past all well controlled  . Diarrhea 11/29/2014  . Hyperlipidemia, mixed 02/02/2015  . Thyroid nodule     Past Surgical History:  Procedure Laterality Date  . ANKLE SURGERY    . COLPOSCOPY  09/2017  . WISDOM TOOTH EXTRACTION Bilateral     Allergies as of 05/02/2020      Reactions   Other Cough   Capers causes SOB Cats 4+      Medication List       Accurate as of May 02, 2020 11:59 PM. If you have any questions, ask your nurse or doctor.        STOP taking these medications   predniSONE 10 MG tablet Commonly known as: DELTASONE Stopped by: Kathlene November, MD     TAKE these medications   albuterol (2.5 MG/3ML) 0.083% nebulizer solution Commonly known as: PROVENTIL Take 3 mLs (2.5 mg total) by nebulization every 6 (six) hours as needed for wheezing or shortness of breath.   albuterol 108 (90 Base) MCG/ACT inhaler Commonly known as: Ventolin HFA Inhale 2 puffs into the lungs every 6 (six) hours as needed for wheezing or shortness of breath.   ALPRAZolam 0.25 MG tablet Commonly known as: XANAX TAKE 1 TABLET(0.25 MG) BY MOUTH TWICE DAILY AS NEEDED FOR ANXIETY   citalopram 20 MG tablet Commonly known as: CELEXA Take 20 mg by mouth daily.   EPINEPHrine 0.3 mg/0.3 mL Soaj  injection Commonly known as: Auvi-Q Inject 0.3 mLs (0.3 mg total) into the muscle as needed for anaphylaxis.   Fasenra Pen 30 MG/ML Soaj Generic drug: Benralizumab INJECT 30MG  SUBCUTANEOUSLY  EVERY 4 WEEKS FOR 3 MONTHS, THEN EVERY 8 WEEKS  THEREAFTER (GIVEN AT MD  OFFICE)   Fasenra 30 MG/ML Sosy Generic drug: Benralizumab INJECT 30MG  SUBCUTANEOUSLY  EVERY 8 WEEKS (GIVEN AT MD  OFFICE)   sulfamethoxazole-trimethoprim 800-160 MG tablet Commonly known as: BACTRIM DS Take 1 tablet by mouth 2 (two) times daily. Started by: Kathlene November, MD   valACYclovir 500 MG tablet Commonly known as: VALTREX Take 500 mg by mouth daily.          Objective:   Physical Exam Abdominal:      BP 108/76 (BP Location: Left Arm, Patient Position: Sitting, Cuff Size: Large)   Pulse 76   Temp 97.7 F (36.5 C) (Oral)   Ht 5\' 6"  (1.676 m)   Wt 183 lb (83 kg)   SpO2 98%   BMI 29.54 kg/m  General:   Well developed, NAD, BMI noted.  HEENT:  Normocephalic . Face symmetric, atraumatic Abdomen:  Not distended, soft, see graphic Question of right CVA tenderness, very mild Skin: Not pale. Not jaundice Lower extremities: no pretibial edema bilaterally  Neurologic:  alert & oriented X3.  Speech normal, gait appropriate for age and unassisted Psych--  Cognition and judgment appear intact.  Cooperative with normal attention span and concentration.  Behavior appropriate. No anxious or depressed appearing.     Assessment      35 year old female with a history of asthma, s/p  admission to ICU due to pneumonia in 2016, GERD, h/o depression/anxiety presents with    UTI: Symptoms consistent with UTI, noting that there is a question of mild R flank tenderness with percussion and mild mid right abdominal discomfort, kidney stone? Unable to get a Udip d/t Azo standard UPT negative. Plan: Empiric Bactrim, UA-UCX. If not better soon or if she develops moderate to severe flank pain: Call the  office  This visit occurred during the SARS-CoV-2 public health emergency.  Safety protocols were in place, including screening questions prior to the visit, additional usage of staff PPE, and extensive cleaning of exam room while observing appropriate contact time as indicated for disinfecting solutions.

## 2020-05-05 ENCOUNTER — Ambulatory Visit (HOSPITAL_BASED_OUTPATIENT_CLINIC_OR_DEPARTMENT_OTHER)
Admission: RE | Admit: 2020-05-05 | Discharge: 2020-05-05 | Disposition: A | Discharge status: Home / Self Care | Payer: Commercial Managed Care - PPO | Source: Ambulatory Visit | Attending: Internal Medicine | Admitting: Internal Medicine

## 2020-05-05 ENCOUNTER — Other Ambulatory Visit: Payer: Self-pay

## 2020-05-05 ENCOUNTER — Ambulatory Visit (INDEPENDENT_AMBULATORY_CARE_PROVIDER_SITE_OTHER): Payer: Commercial Managed Care - PPO | Admitting: Internal Medicine

## 2020-05-05 ENCOUNTER — Encounter: Payer: Self-pay | Admitting: Internal Medicine

## 2020-05-05 VITALS — BP 106/74 | HR 83 | Temp 98.0°F | Resp 18 | Ht 66.0 in | Wt 183.0 lb

## 2020-05-05 DIAGNOSIS — N390 Urinary tract infection, site not specified: Secondary | ICD-10-CM | POA: Diagnosis not present

## 2020-05-05 DIAGNOSIS — M549 Dorsalgia, unspecified: Secondary | ICD-10-CM | POA: Diagnosis not present

## 2020-05-05 DIAGNOSIS — R1084 Generalized abdominal pain: Secondary | ICD-10-CM | POA: Diagnosis present

## 2020-05-05 DIAGNOSIS — R112 Nausea with vomiting, unspecified: Secondary | ICD-10-CM | POA: Diagnosis not present

## 2020-05-05 LAB — COMPREHENSIVE METABOLIC PANEL
ALT: 18 U/L (ref 0–35)
AST: 24 U/L (ref 0–37)
Albumin: 4 g/dL (ref 3.5–5.2)
Alkaline Phosphatase: 62 U/L (ref 39–117)
BUN: 13 mg/dL (ref 6–23)
CO2: 25 mEq/L (ref 19–32)
Calcium: 8.9 mg/dL (ref 8.4–10.5)
Chloride: 107 mEq/L (ref 96–112)
Creatinine, Ser: 0.89 mg/dL (ref 0.40–1.20)
GFR: 83.76 mL/min (ref >60.00)
Glucose, Bld: 85 mg/dL (ref 70–99)
Potassium: 4.8 mEq/L (ref 3.5–5.1)
Sodium: 140 mEq/L (ref 135–145)
Total Bilirubin: 0.4 mg/dL (ref 0.2–1.2)
Total Protein: 6.5 g/dL (ref 6.0–8.3)

## 2020-05-05 LAB — URINALYSIS, ROUTINE W REFLEX MICROSCOPIC
Bilirubin Urine: NEGATIVE
Ketones, ur: NEGATIVE
Leukocytes,Ua: NEGATIVE
Nitrite: NEGATIVE
Specific Gravity, Urine: >=1.03 — AB (ref 1.000–1.030)
Total Protein, Urine: NEGATIVE
Urine Glucose: NEGATIVE
Urobilinogen, UA: 0.2 (ref 0.0–1.0)
pH: 6 (ref 5.0–8.0)

## 2020-05-05 LAB — CBC WITH DIFFERENTIAL/PLATELET
Basophils Absolute: 0 10*3/uL (ref 0.0–0.1)
Basophils Relative: 0.2 % (ref 0.0–3.0)
Eosinophils Absolute: 0 10*3/uL (ref 0.0–0.7)
Eosinophils Relative: 0 % (ref 0.0–5.0)
HCT: 35.1 % — ABNORMAL LOW (ref 36.0–46.0)
Hemoglobin: 11.5 g/dL — ABNORMAL LOW (ref 12.0–15.0)
Lymphocytes Relative: 22.8 % (ref 12.0–46.0)
Lymphs Abs: 1.5 10*3/uL (ref 0.7–4.0)
MCHC: 32.9 g/dL (ref 30.0–36.0)
MCV: 91.3 fl (ref 78.0–100.0)
Monocytes Absolute: 0.4 10*3/uL (ref 0.1–1.0)
Monocytes Relative: 6.6 % (ref 3.0–12.0)
Neutro Abs: 4.7 10*3/uL (ref 1.4–7.7)
Neutrophils Relative %: 70.4 % (ref 43.0–77.0)
Platelets: 322 10*3/uL (ref 150.0–400.0)
RBC: 3.84 Mil/uL — ABNORMAL LOW (ref 3.87–5.11)
RDW: 13.7 % (ref 11.5–15.5)
WBC: 6.6 10*3/uL (ref 4.0–10.5)

## 2020-05-05 LAB — URINE CULTURE
MICRO NUMBER:: 11608894
SPECIMEN QUALITY:: ADEQUATE

## 2020-05-05 LAB — URINALYSIS
Bilirubin Urine: NEGATIVE
Glucose, UA: NEGATIVE
Ketones, ur: NEGATIVE
Nitrite: POSITIVE — AB
Specific Gravity, Urine: 1.026 (ref 1.001–1.03)
pH: <=5 (ref 5.0–8.0)

## 2020-05-05 LAB — HCG, QUANTITATIVE, PREGNANCY: Quantitative HCG: <0.6 m[IU]/mL

## 2020-05-05 MED ORDER — ONDANSETRON HCL 8 MG PO TABS: 8.0000 mg | ORAL_TABLET | Freq: Three times a day (TID) | ORAL | 0 refills | Status: DC | PRN

## 2020-05-05 NOTE — Patient Instructions (Signed)
Continue taking antibiotics as you are doing  Drink plenty of fluids  Start taking Zofran, and nausea medication as needed  If your symptoms get much worse, please call the office.    GO TO THE LAB : Get the blood work      STOP BY THE FIRST FLOOR: Schedule a ultrasound

## 2020-05-05 NOTE — Telephone Encounter (Signed)
Pt seen today 05/05/20 at 11:20 by Larose Kells

## 2020-05-05 NOTE — Progress Notes (Signed)
Subjective:    Patient ID: Meagan Chung, female    DOB: 09-Jun-1985, 35 y.o.   MRN: [id number]  DOS:  05/05/2020 Type of visit - description: Acute: She was seen 3 days ago, at the time she reported dysuria, urinary frequency and suprapubic pressure.  She also had right flank discomfort. UTI was suspected, Rx Bactrim, UCX showed E. coli.   She is here because this morning she woke up with nausea, and around 10 AM she started vomiting "bile". Also, the right-sided pain is  perhaps slightly worse, it ranges from 3/10 to 7/10. Again location pain is at the right back but denies abdominal pain per se. Symptoms are not made worse by eating.  Review of Systems Denies fever chills No chest pain no difficulty breathing No diarrhea Gross hematuria?  She is not sure, she thinks she saw some a couple of days ago but shortly after she started her menses.  Past Medical History:  Diagnosis Date  . Anxiety and depression    anxiety, bulemia in past all well controlled   . Asthma   . Depression    anxiety, bulemia in past all well controlled  . Diarrhea 11/29/2014  . Hyperlipidemia, mixed 02/02/2015  . Thyroid nodule     Past Surgical History:  Procedure Laterality Date  . ANKLE SURGERY    . COLPOSCOPY  09/2017  . WISDOM TOOTH EXTRACTION Bilateral     Allergies as of 05/05/2020      Reactions   Other Cough   Capers causes SOB Cats 4+      Medication List       Accurate as of May 05, 2020 11:59 PM. If you have any questions, ask your nurse or doctor.        albuterol (2.5 MG/3ML) 0.083% nebulizer solution Commonly known as: PROVENTIL Take 3 mLs (2.5 mg total) by nebulization every 6 (six) hours as needed for wheezing or shortness of breath.   albuterol 108 (90 Base) MCG/ACT inhaler Commonly known as: Ventolin HFA Inhale 2 puffs into the lungs every 6 (six) hours as needed for wheezing or shortness of breath.   ALPRAZolam 0.25 MG tablet Commonly known as: XANAX TAKE  1 TABLET(0.25 MG) BY MOUTH TWICE DAILY AS NEEDED FOR ANXIETY   citalopram 20 MG tablet Commonly known as: CELEXA Take 20 mg by mouth daily.   EPINEPHrine 0.3 mg/0.3 mL Soaj injection Commonly known as: Auvi-Q Inject 0.3 mLs (0.3 mg total) into the muscle as needed for anaphylaxis.   Fasenra Pen 30 MG/ML Soaj Generic drug: Benralizumab INJECT 30MG  SUBCUTANEOUSLY  EVERY 4 WEEKS FOR 3 MONTHS, THEN EVERY 8 WEEKS  THEREAFTER (GIVEN AT MD  OFFICE)   Berna Bue 30 MG/ML Sosy Generic drug: Benralizumab INJECT 30MG  SUBCUTANEOUSLY  EVERY 8 WEEKS (GIVEN AT MD  OFFICE)   ondansetron 8 MG tablet Commonly known as: Zofran Take 1 tablet (8 mg total) by mouth every 8 (eight) hours as needed for nausea or vomiting. Started by: Kathlene November, MD   sulfamethoxazole-trimethoprim 800-160 MG tablet Commonly known as: BACTRIM DS Take 1 tablet by mouth 2 (two) times daily.   valACYclovir 500 MG tablet Commonly known as: VALTREX Take 500 mg by mouth daily.          Objective:   Physical Exam Abdominal:    Skin:        BP 106/74 (BP Location: Left Arm, Patient Position: Sitting, Cuff Size: Small)   Pulse 83   Temp 98 F (36.7 C) (  Oral)   Resp 18   Ht 5\' 6"  (1.676 m)   Wt 183 lb (83 kg)   LMP 05/04/2020 (Exact Date)   SpO2 97%   BMI 29.54 kg/m  General:   Well developed, NAD, BMI noted.  HEENT:  Normocephalic . Face symmetric, atraumatic Lungs:  CTA B Normal respiratory effort, no intercostal retractions, no accessory muscle use. Heart: RRR,  no murmur.  Abdomen:  Not distended, soft, mildly tender at the right upper quadrant without mass or rebound.  See graphic Skin: Not pale. Not jaundice Lower extremities: no pretibial edema bilaterally  Neurologic:  alert & oriented X3.  Speech normal, gait appropriate for age and unassisted Psych--  Cognition and judgment appear intact.  Cooperative with normal attention span and concentration.  Behavior appropriate. No anxious or  depressed appearing.     Assessment       35 year old female with a history of asthma, s/p  admission to ICU due to pneumonia in 2016, GERD, h/o depression/anxiety presents with    UTI, right back pain, RUQ abdominal tenderness to palpation.   Symptoms as described above, she was seen 3 days ago, ucx showed E. coli, on Bactrim, LUTS better but now she has more pain, nausea and has vomited a few times. DDx includes kidney stone (+ FH: father and sister have kidney stones), also cholelithiasis is possible, pyelonephritis,vs others. UPT was negative, her partner had a vasectomy. Plan: Continue Bactrim, add Zofran. CMP, CBC, serum pregnancy test, recheck a UA. Chest x-ray, abdominal ultrasound. Good hydration, call if sxs worsen.     This visit occurred during the SARS-CoV-2 public health emergency.  Safety protocols were in place, including screening questions prior to the visit, additional usage of staff PPE, and extensive cleaning of exam room while observing appropriate contact time as indicated for disinfecting solutions.

## 2020-05-06 ENCOUNTER — Encounter: Payer: Self-pay | Admitting: Internal Medicine

## 2020-06-30 ENCOUNTER — Encounter: Payer: Commercial Managed Care - PPO | Admitting: Family Medicine

## 2020-07-11 ENCOUNTER — Telehealth: Payer: Self-pay | Admitting: *Deleted

## 2020-07-11 NOTE — Telephone Encounter (Signed)
Patient advised needs appt for [facility] and patient has new Ins so will need to change pharmacy- appt made

## 2020-07-16 ENCOUNTER — Ambulatory Visit: Payer: BC Managed Care – PPO | Admitting: Family Medicine

## 2020-07-16 ENCOUNTER — Other Ambulatory Visit: Payer: Self-pay

## 2020-07-16 ENCOUNTER — Encounter: Payer: Self-pay | Admitting: Family Medicine

## 2020-07-16 VITALS — BP 118/76 | HR 83 | Temp 98.1°F | Resp 18 | Ht 66.0 in | Wt 176.2 lb

## 2020-07-16 DIAGNOSIS — J4541 Moderate persistent asthma with (acute) exacerbation: Secondary | ICD-10-CM | POA: Diagnosis not present

## 2020-07-16 DIAGNOSIS — J3089 Other allergic rhinitis: Secondary | ICD-10-CM | POA: Diagnosis not present

## 2020-07-16 DIAGNOSIS — J302 Other seasonal allergic rhinitis: Secondary | ICD-10-CM | POA: Diagnosis not present

## 2020-07-16 DIAGNOSIS — J0101 Acute recurrent maxillary sinusitis: Secondary | ICD-10-CM | POA: Diagnosis not present

## 2020-07-16 MED ORDER — AMOXICILLIN-POT CLAVULANATE 875-125 MG PO TABS: 1.0000 | ORAL_TABLET | Freq: Two times a day (BID) | ORAL | 0 refills | Status: AC

## 2020-07-16 MED ORDER — CETIRIZINE HCL 10 MG PO TABS: 10.0000 mg | ORAL_TABLET | Freq: Every day | ORAL | 5 refills | Status: DC

## 2020-07-16 MED ORDER — FLUTICASONE PROPIONATE 50 MCG/ACT NA SUSP: NASAL | 5 refills | Status: DC

## 2020-07-16 MED ORDER — FLOVENT HFA 110 MCG/ACT IN AERO: 2.0000 | INHALATION_SPRAY | Freq: Two times a day (BID) | RESPIRATORY_TRACT | 1 refills | Status: DC

## 2020-07-16 MED ORDER — EPINEPHRINE 0.3 MG/0.3ML IJ SOAJ: 0.3000 mg | INTRAMUSCULAR | 1 refills | Status: DC | PRN

## 2020-07-16 MED ORDER — ALBUTEROL SULFATE HFA 108 (90 BASE) MCG/ACT IN AERS: 2.0000 | INHALATION_SPRAY | Freq: Four times a day (QID) | RESPIRATORY_TRACT | 1 refills | Status: DC | PRN

## 2020-07-16 NOTE — Progress Notes (Signed)
[place] [address] Dept: [phone]  FOLLOW UP NOTE  Patient ID: Meagan Chung, female    DOB: 04-22-1985  Age: 34 y.o. MRN: [id number] Date of Office Visit: 07/16/2020  Assessment  Chief Complaint: Asthma (ACT - 11 poorly control ) and Other (Fasenra shots - re approval )  HPI Meagan Chung is a 35 year old female who presents to the clinic for follow-up visit.  She was last seen in this clinic on 04/02/2019 via televisit with Dr. Ernst Bowler for evaluation of asthma and allergic rhinitis.  She reports that she has recently changed jobs and subsequently changed insurance which has affected her Berna Bue availability.  She reports that she continues Fasenra at home injection once every 8 weeks and at today's visit she is about 2 weeks late on her Fasenra injection.  At today's visit, she reports her asthma has been moderately well controlled with symptoms beginning about a week ago including occasional shortness of breath, some wheezing mostly occurring at night, and producing thick mucus.  She reports that she did use albuterol today and has not needed to use Flovent since her last visit to this clinic.  She reports no local reactions with Fasenra injections.  She reports a significant improvement in her symptoms of asthma while continuing on Fasenra.  Allergic rhinitis is reported as poorly controlled with nasal congestion, thick postnasal drainage, and some green nasal drainage that began about a week ago.  She does report facial pain underneath both of her eyes.  She is currently using saline nasal rinses once or twice a day and has recently taken Mucinex 1 time with no relief of symptoms.  She is not currently taking an antihistamine or using steroid nasal spray.  She saw Dr.Marcellino with [facility] and had a sinus CT on 05/21/2019 indicating " minimal bilateral maxillary mucosal thickening, bilateral Haller cells, scattered  spotty mucosal thickening of a couple of ethmoid cells, clear frontal and sphenoid sinuses.  Straight nasal septum.  Minimal inferior turbinate hypertrophy".  She did have a turbinate reduction on 11/09/2019, however, she continues to have frequent sinus infections requiring antibiotics.  Her last skin testing was on 04/08/2017 was positive to tree pollen and cat hair on percutaneous skin testing and borderline positive to grass pollen. Intradermal testing was positive to mold mix 4, dog, and mite mix. She reports that she does have a dog and the dog sleeps in her bed. She is using dust mite free covers at this time. She denies reflux and is not currently taking a medication to control reflux. She has previously tried a therapeutic trial of a PPI without improvement in her symptoms. Her current medications are listed in the chart.  Of note, previous skin testing results, avoidance measures, and recommendation for allergen immunotherapy upon failure of medications were reviewed.   Drug Allergies:  Allergies  Allergen Reactions  . Other Cough    Capers causes SOB Cats 4+    Physical Exam: BP 118/76   Pulse 83   Temp 98.1 F (36.7 C)   Resp 18   Ht 5\' 6"  (1.676 m)   Wt 176 lb 3.2 oz (79.9 kg)   SpO2 98%   BMI 28.44 kg/m    Physical Exam Vitals reviewed.  Constitutional:      Appearance: Normal appearance.  HENT:     Head: Normocephalic and atraumatic.     Right Ear: Tympanic membrane normal.     Left  Ear: Tympanic membrane normal.     Nose:     Comments: Bilateral nares edematous with clear nasal drainage noted.  Pharynx slightly erythematous with no exudate.  Ears normal.  Eyes normal. Eyes:     Conjunctiva/sclera: Conjunctivae normal.  Cardiovascular:     Rate and Rhythm: Normal rate and regular rhythm.     Heart sounds: Normal heart sounds. No murmur heard.   Pulmonary:     Effort: Pulmonary effort is normal.     Breath sounds: Normal breath sounds.     Comments: Lungs clear  to auscultation Musculoskeletal:        General: Normal range of motion.     Cervical back: Normal range of motion and neck supple.  Skin:    General: Skin is warm and dry.  Neurological:     Mental Status: She is alert and oriented to person, place, and time.  Psychiatric:        Mood and Affect: Mood normal.        Behavior: Behavior normal.        Thought Content: Thought content normal.        Judgment: Judgment normal.     Diagnostics: FVC 4.95, FEV1 3.85.  Predicted FVC 4.00, predicted FEV1 3.30.  Spirometry indicates normal ventilatory function.  Assessment and Plan: 1. Moderate persistent asthma with acute exacerbation   2. Seasonal and perennial allergic rhinitis   3. Acute recurrent maxillary sinusitis     Meds ordered this encounter  Medications  . albuterol (VENTOLIN HFA) 108 (90 Base) MCG/ACT inhaler    Sig: Inhale 2 puffs into the lungs every 6 (six) hours as needed for wheezing or shortness of breath.    Dispense:  18 g    Refill:  1    Pt will call when needed  . EPINEPHrine (AUVI-Q) 0.3 mg/0.3 mL IJ SOAJ injection    Sig: Inject 0.3 mg into the muscle as needed for anaphylaxis.    Dispense:  1 each    Refill:  1  . amoxicillin-clavulanate (AUGMENTIN) 875-125 MG tablet    Sig: Take 1 tablet by mouth 2 (two) times daily for 10 days. Take one tablet twice a day for the next 10 days then STOP    Dispense:  20 tablet    Refill:  0  . fluticasone (FLOVENT HFA) 110 MCG/ACT inhaler    Sig: Inhale 2 puffs into the lungs 2 (two) times daily. During asthma flares inhale 2 puffs twice a day with a spacer for 2 weeks or until cough and wheeze free.    Dispense:  12 g    Refill:  1  . cetirizine (ZYRTEC) 10 MG tablet    Sig: Take 1 tablet (10 mg total) by mouth daily.    Dispense:  30 tablet    Refill:  5  . fluticasone (FLONASE) 50 MCG/ACT nasal spray    Sig: 1-2 sprays once a day as needed for a stuffy nose.In the right nostril, point the applicator out toward  the right ear. In the left nostril, point the applicator out toward the left ear    Dispense:  16 g    Refill:  5    Patient Instructions  Acute sinusitis Begin Augmentin 875 mg twice a day for the next 10 days Begin prednisone 10 mg tablets. Take 2 tablets twice a day for 3 days, then take 2 tablets once a day for 1 day, then take 1 tablet on the 5th day, then  stop Continue saline nasal rinses at least twice a day Stop taking your antihistamine for the next 10 days Begin Mucinex [phone] mg twice a day to thin out mucus.  Asthma Continue Fasenra injections once every 8 weeks to prevent cough and wheeze and have access to an epinephrine auto-injector set Continue albuterol 2 puffs every 4 hours as needed for cough and wheeze For now and for asthma flares, begin Flovent 110-2 puffs twice a day with a spacer for 2 weeks or until cough and wheeze free  Allergic rhinitis Continue cetirizine as needed for a runny nose or itch Continue Flonase 1-2 sprays once a day as needed for a stuffy nose.  In the right nostril, point the applicator out toward the right ear. In the left nostril, point the applicator out toward the left ear Continue saline nasal rinses twice a day for nasal symptoms. Use this before any medicated nasal sprays for best result Continue allergen avoidance measures directed toward tree pollen, cat, dog, as listed below Consider allergen immunotherapy if the treatment plan is not working well for you  Call the clinic if this treatment plan is not working well for you  Follow up in 3 months or sooner if needed   Return in about 3 months (around 10/16/2020), or if symptoms worsen or fail to improve.    Thank you for the opportunity to care for this patient.  Please do not hesitate to contact me with questions.  Gareth Morgan, FNP [facility]

## 2020-07-16 NOTE — Patient Instructions (Addendum)
Acute sinusitis Begin Augmentin 875 mg twice a day for the next 10 days Begin prednisone 10 mg tablets. Take 2 tablets twice a day for 3 days, then take 2 tablets once a day for 1 day, then take 1 tablet on the 5th day, then stop Continue saline nasal rinses at least twice a day Stop taking your antihistamine for the next 10 days Begin Mucinex [phone] mg twice a day to thin out mucus.  Asthma Continue Fasenra injections once every 8 weeks to prevent cough and wheeze and have access to an epinephrine auto-injector set Continue albuterol 2 puffs every 4 hours as needed for cough and wheeze For now and for asthma flares, begin Flovent 110-2 puffs twice a day with a spacer for 2 weeks or until cough and wheeze free  Allergic rhinitis Continue cetirizine as needed for a runny nose or itch Continue Flonase 1-2 sprays once a day as needed for a stuffy nose.  In the right nostril, point the applicator out toward the right ear. In the left nostril, point the applicator out toward the left ear Continue saline nasal rinses twice a day for nasal symptoms. Use this before any medicated nasal sprays for best result Continue allergen avoidance measures directed toward tree pollen, cat, dog, as listed below Consider allergen immunotherapy if the treatment plan is not working well for you  Call the clinic if this treatment plan is not working well for you  Follow up in 3 months or sooner if needed  Reducing Pollen Exposure The American Academy of Allergy, Asthma and Immunology suggests the following steps to reduce your exposure to pollen during allergy seasons. 1. Do not hang sheets or clothing out to dry; pollen may collect on these items. 2. Do not mow lawns or spend time around freshly cut grass; mowing stirs up pollen. 3. Keep windows closed at night.  Keep car windows closed while driving. 4. Minimize morning activities outdoors, a time when pollen counts are usually at their highest. 5. Stay  indoors as much as possible when pollen counts or humidity is high and on windy days when pollen tends to remain in the air longer. 6. Use air conditioning when possible.  Many air conditioners have filters that trap the pollen spores. 7. Use a HEPA room air filter to remove pollen form the indoor air you breathe.  Control of Dog or Cat Allergen Avoidance is the best way to manage a dog or cat allergy. If you have a dog or cat and are allergic to dog or cats, consider removing the dog or cat from the home. If you have a dog or cat but don't want to find it a new home, or if your family wants a pet even though someone in the household is allergic, here are some strategies that may help keep symptoms at bay:  8. Keep the pet out of your bedroom and restrict it to only a few rooms. Be advised that keeping the dog or cat in only one room will not limit the allergens to that room. 9. Don't pet, hug or kiss the dog or cat; if you do, wash your hands with soap and water. 10. High-efficiency particulate air (HEPA) cleaners run continuously in a bedroom or living room can reduce allergen levels over time. 11. Regular use of a high-efficiency vacuum cleaner or a central vacuum can reduce allergen levels. 12. Giving your dog or cat a bath at least once a week can reduce airborne allergen.  Control of Dust Mite Allergen Dust mites play a major role in allergic asthma and rhinitis. They occur in environments with high humidity wherever human skin is found. Dust mites absorb humidity from the atmosphere (ie, they do not drink) and feed on organic matter (including shed human and animal skin). Dust mites are a microscopic type of insect that you cannot see with the naked eye. High levels of dust mites have been detected from mattresses, pillows, carpets, upholstered furniture, bed covers, clothes, soft toys and any woven material. The principal allergen of the dust mite is found in its feces. A gram of dust may  contain 1,000 mites and 250,000 fecal particles. Mite antigen is easily measured in the air during house cleaning activities. Dust mites do not bite and do not cause harm to humans, other than by triggering allergies/asthma.  Ways to decrease your exposure to dust mites in your home:  1. Encase mattresses, box springs and pillows with a mite-impermeable barrier or cover  2. Wash sheets, blankets and drapes weekly in hot water (130 F) with detergent and dry them in a dryer on the hot setting.  3. Have the room cleaned frequently with a vacuum cleaner and a damp dust-mop. For carpeting or rugs, vacuuming with a vacuum cleaner equipped with a high-efficiency particulate air (HEPA) filter. The dust mite allergic individual should not be in a room which is being cleaned and should wait 1 hour after cleaning before going into the room.  4. Do not sleep on upholstered furniture (eg, couches).  5. If possible removing carpeting, upholstered furniture and drapery from the home is ideal. Horizontal blinds should be eliminated in the rooms where the person spends the most time (bedroom, study, television room). Washable vinyl, roller-type shades are optimal.  6. Remove all non-washable stuffed toys from the bedroom. Wash stuffed toys weekly like sheets and blankets above.  7. Reduce indoor humidity to less than 50%. Inexpensive humidity monitors can be purchased at most hardware stores. Do not use a humidifier as can make the problem worse and are not recommended.

## 2020-07-17 ENCOUNTER — Telehealth: Payer: Self-pay

## 2020-07-17 NOTE — Telephone Encounter (Signed)
Patient came was seen 07/16/20 by Gareth Morgan for Meagan Chung re- approval.

## 2020-07-18 NOTE — Telephone Encounter (Signed)
noted 

## 2020-07-29 ENCOUNTER — Telehealth: Payer: Self-pay | Admitting: *Deleted

## 2020-07-29 NOTE — Telephone Encounter (Signed)
Patient called regarding status of Fasenra. I have gotten approval but due to Ins change will need to change pharmacy from [facility] to [facility] and Rx sent with copay, Ins and approval

## 2020-09-09 DIAGNOSIS — L700 Acne vulgaris: Secondary | ICD-10-CM | POA: Diagnosis not present

## 2020-09-12 DIAGNOSIS — F411 Generalized anxiety disorder: Secondary | ICD-10-CM | POA: Diagnosis not present

## 2020-09-17 DIAGNOSIS — F411 Generalized anxiety disorder: Secondary | ICD-10-CM | POA: Diagnosis not present

## 2020-09-17 DIAGNOSIS — F331 Major depressive disorder, recurrent, moderate: Secondary | ICD-10-CM | POA: Diagnosis not present

## 2020-09-19 DIAGNOSIS — F411 Generalized anxiety disorder: Secondary | ICD-10-CM | POA: Diagnosis not present

## 2020-09-22 ENCOUNTER — Other Ambulatory Visit: Payer: Self-pay | Admitting: *Deleted

## 2020-09-22 ENCOUNTER — Ambulatory Visit: Payer: BC Managed Care – PPO | Admitting: Family

## 2020-09-22 ENCOUNTER — Telehealth: Payer: Self-pay | Admitting: Family

## 2020-09-22 ENCOUNTER — Other Ambulatory Visit: Payer: Self-pay

## 2020-09-22 ENCOUNTER — Encounter: Payer: Self-pay | Admitting: Family

## 2020-09-22 VITALS — BP 120/76 | HR 82 | Temp 98.2°F | Resp 16 | Ht 66.0 in | Wt 181.1 lb

## 2020-09-22 DIAGNOSIS — J329 Chronic sinusitis, unspecified: Secondary | ICD-10-CM | POA: Diagnosis not present

## 2020-09-22 DIAGNOSIS — J4551 Severe persistent asthma with (acute) exacerbation: Secondary | ICD-10-CM | POA: Diagnosis not present

## 2020-09-22 DIAGNOSIS — R0602 Shortness of breath: Secondary | ICD-10-CM

## 2020-09-22 DIAGNOSIS — J302 Other seasonal allergic rhinitis: Secondary | ICD-10-CM

## 2020-09-22 DIAGNOSIS — J3089 Other allergic rhinitis: Secondary | ICD-10-CM | POA: Diagnosis not present

## 2020-09-22 MED ORDER — FLUTICASONE PROPIONATE 50 MCG/ACT NA SUSP: NASAL | 5 refills | Status: DC

## 2020-09-22 MED ORDER — DULERA 100-5 MCG/ACT IN AERO: 2.0000 | INHALATION_SPRAY | Freq: Two times a day (BID) | RESPIRATORY_TRACT | 5 refills | Status: DC

## 2020-09-22 MED ORDER — SPACER/AERO-HOLDING CHAMBERS DEVI: 1.0000 | 0 refills | Status: DC

## 2020-09-22 MED ORDER — BUDESONIDE-FORMOTEROL FUMARATE 160-4.5 MCG/ACT IN AERO: INHALATION_SPRAY | RESPIRATORY_TRACT | 5 refills | Status: DC

## 2020-09-22 MED ORDER — ALBUTEROL SULFATE HFA 108 (90 BASE) MCG/ACT IN AERS: 2.0000 | INHALATION_SPRAY | RESPIRATORY_TRACT | 1 refills | Status: DC | PRN

## 2020-09-22 NOTE — Telephone Encounter (Signed)
Advair, Miquel Dunn, Trelegy, Ruthe Mannan, Flovent Diskus, and Spiriva.

## 2020-09-22 NOTE — Addendum Note (Signed)
Addended by: Larence Penning on: 09/22/2020 05:12 PM   Modules accepted: Orders

## 2020-09-22 NOTE — Patient Instructions (Addendum)
  Asthma Start prednisone 10 mg taking 2 tablets twice a day for 3 days, then on the fourth day take 1 tablet in the morning, and on the fifth day take 1 tablet once Start Symbicort 160/4.5 mcg 2 puffs twice a day with spacer Continue Fasenra injections once every 8 weeks to prevent cough and wheeze and have access to an epinephrine auto-injector set Continue albuterol 2 puffs every 4 hours as needed for cough and wheeze Stop Flovent for asthma flares for now If your breathing does not get better go on to the Emergency Room.  Allergic rhinitis Your rapid Covid-19 test today was: Negative Continue cetirizine as needed for a runny nose or itch Continue Flonase 1-2 sprays once a day  for a stuffy nose.  In the right nostril, point the applicator out toward the right ear. In the left nostril, point the applicator out toward the left ear. Try and use this every day rather than waiting to use it when you are sick Continue saline nasal rinses twice a day for nasal symptoms. Use this before any medicated nasal sprays for best result Continue allergen avoidance measures directed toward tree pollen, cat, dog, as listed below Consider allergen immunotherapy if the treatment plan is not working well for you  Recurrent sinus infections Consider repeating immune labs at your next office visit when you are healthy  Call the clinic if this treatment plan is not working well for you  Follow up in 6 weeks with Dr. Ernst Bowler or sooner if needed

## 2020-09-22 NOTE — Progress Notes (Signed)
[place] [address] Dept: [phone]  FOLLOW UP NOTE  Patient ID: Meagan Chung, female    DOB: 07/07/1985  Age: 35 y.o. MRN: [id number] Date of Office Visit: 09/22/2020  Assessment  Chief Complaint: Asthma (Started Saturday and by Sunday was coughing with a lot of congestion. None of her medications are helping. Also has shortness of breath, wheezing along with the cough ) and Sinus Problem (Greenish/yellow drainage down throat, headache, sinus pressure - took '40mg'$  of prednisone last night and uses albuterol inhaler ever hour or 2. COVID test was negative. )  HPI Meagan Chung is a 35 year old female who presents today for an acute visit.  She was last seen on Jul 16, 2020 by Gareth Morgan, FNP for moderate persistent asthma with acute exacerbation, seasonal and perennial allergic rhinitis, and acute recurrent maxillary sinusitis.  Asthma is reported as not well controlled with Fasenra injections every 8 weeks and albuterol as needed.  She also has Flovent 110 mcg to use for asthma flares,but she has not started this medication.  She reports that around Saturday she began having a productive cough at times with clear sputum and other times the cough is dry, tightness in her chest, wheezing, and feeling like she is unable to get a deep breath.  She denies any fever, chills stray of blood clots, recent surgery, or long distance travel..  She has been using her albuterol every 2 hours and does not feel like it helps.  Since her last office visit she has not required any trips to the emergency room or urgent care due to breathing problems.  She had leftover prednisone 40 mg that she took yesterday and this did not help with her symptoms.  Allergic rhinitis is reported as not well controlled with cetirizine as needed, and Flonase only when she is sick.  She does try to use her saline rinses once a day when healthy and twice a day when sick.  She reports nasal  congestion, clear rhinorrhea, clear postnasal drip, headache, and sinus pressure.  She denies fever or chills.  She reports that she took a rapid COVID-19 test yesterday and it was negative.  She has had 2 COVID 19 vaccines plus a booster.  She denies any sick exposure.  She reports that she has had 12 sinus infections over the past year and a half.  Also, she reports that she had sinus surgery back in September by Dr. Lucita Ferrara and she cannot tell a difference.  Her immune labs that she had completed in February 2019  were normal.  Drug Allergies:  Allergies  Allergen Reactions   Other Cough    Capers causes SOB Cats 4+    Review of Systems: Review of Systems  Constitutional:  Negative for chills and fever.  HENT:         Reports nasal congestion, mostly clear rhinorrhea, clear postnasal drip, and sinus pressure.  Eyes:        Denies itchy watery eyes  Respiratory:  Positive for cough, shortness of breath and wheezing.        Reports cough that is productive at times with clear sputum, wheezing, tightness in her chest, and difficulty getting a deep breath.  Cardiovascular:  Negative for chest pain and palpitations.  Gastrointestinal:        Denies heartburn and reflux symptoms  Genitourinary:  Negative for dysuria.  Skin:  Negative for rash.  Neurological:  Positive for headaches.  Endo/Heme/Allergies:  Positive  for environmental allergies.    Physical Exam: BP 120/76   Pulse 82   Temp 98.2 F (36.8 C)   Resp 16   Ht '5\' 6"'$  (1.676 m)   Wt 181 lb 1.6 oz (82.1 kg)   SpO2 99%   BMI 29.23 kg/m    Physical Exam Constitutional:      Appearance: Normal appearance.  HENT:     Head: Normocephalic and atraumatic.     Comments: Pharynx normal, eyes normal, ears normal, nose: Bilateral lower turbinates mildly edematous and slightly erythematous with clear drainage noted    Right Ear: Tympanic membrane, ear canal and external ear normal.     Left Ear: Tympanic membrane, ear canal and  external ear normal.     Mouth/Throat:     Mouth: Mucous membranes are moist.     Pharynx: Oropharynx is clear.  Eyes:     Conjunctiva/sclera: Conjunctivae normal.  Cardiovascular:     Rate and Rhythm: Normal rate and regular rhythm.     Heart sounds: Normal heart sounds.  Pulmonary:     Effort: Pulmonary effort is normal.     Breath sounds: Normal breath sounds.     Comments: Lungs clear to auscultation Musculoskeletal:     Cervical back: Neck supple.  Skin:    General: Skin is warm.  Neurological:     Mental Status: She is alert and oriented to person, place, and time.  Psychiatric:        Mood and Affect: Mood normal.        Behavior: Behavior normal.        Thought Content: Thought content normal.        Judgment: Judgment normal.    Diagnostics: Your rapid COVID-19 test today is negative  FVC 3.52 L, FEV1 2.49 L.  Predicted FVC 4.01 L, predicted FEV1 3.31.  Spirometry indicates possible mild obstruction.  She last used her albuterol inhaler approximately an hour and 45 minutes ago.  Assessment and Plan: 1. Severe persistent asthma with acute exacerbation   2. Seasonal and perennial allergic rhinitis   3. Recurrent sinus infections   4. Shortness of breath     Meds ordered this encounter  Medications   budesonide-formoterol (SYMBICORT) 160-4.5 MCG/ACT inhaler    Sig: Inhale 2 puffs twice a day with spacer. Rinse mouth after use.    Dispense:  1 each    Refill:  5   Spacer/Aero-Holding Chambers DEVI    Sig: Take 1 each by mouth as directed.    Dispense:  1 each    Refill:  0   albuterol (VENTOLIN HFA) 108 (90 Base) MCG/ACT inhaler    Sig: Inhale 2 puffs into the lungs every 4 (four) hours as needed for wheezing or shortness of breath.    Dispense:  18 g    Refill:  1    Pt will call when needed   fluticasone (FLONASE) 50 MCG/ACT nasal spray    Sig: 1-2 sprays once a day as needed for a stuffy nose.In the right nostril, point the applicator out toward the right  ear. In the left nostril, point the applicator out toward the left ear    Dispense:  16 g    Refill:  5     Patient Instructions   Asthma Start prednisone 10 mg taking 2 tablets twice a day for 3 days, then on the fourth day take 1 tablet in the morning, and on the fifth day take 1 tablet once Start Symbicort  160/4.5 mcg 2 puffs twice a day with spacer Continue Fasenra injections once every 8 weeks to prevent cough and wheeze and have access to an epinephrine auto-injector set Continue albuterol 2 puffs every 4 hours as needed for cough and wheeze Stop Flovent for asthma flares for now If your breathing does not get better go on to the Emergency Room.  Allergic rhinitis Your rapid Covid-19 test today was: Negative Continue cetirizine as needed for a runny nose or itch Continue Flonase 1-2 sprays once a day  for a stuffy nose.  In the right nostril, point the applicator out toward the right ear. In the left nostril, point the applicator out toward the left ear. Try and use this every day rather than waiting to use it when you are sick Continue saline nasal rinses twice a day for nasal symptoms. Use this before any medicated nasal sprays for best result Continue allergen avoidance measures directed toward tree pollen, cat, dog, as listed below Consider allergen immunotherapy if the treatment plan is not working well for you  Recurrent sinus infections Consider repeating immune labs at your next office visit when you are healthy  Call the clinic if this treatment plan is not working well for you  Follow up in 6 weeks with Dr. Ernst Bowler or sooner if needed    Return in about 6 weeks (around 11/03/2020) for with Dr. Ernst Bowler.    Thank you for the opportunity to care for this patient.  Please do not hesitate to contact me with questions.  Althea Charon, FNP [facility]

## 2020-09-22 NOTE — Telephone Encounter (Signed)
Medication has been sent in. Called patient and left a voicemail asking for her to return call to discuss.

## 2020-09-22 NOTE — Telephone Encounter (Signed)
Hey can you please provide alternatives and I can check coverage? Thank You.

## 2020-09-22 NOTE — Telephone Encounter (Signed)
Please send in a prescription for Dulera 100 mcg 2 puffs twice a day with spacer to help prevent cough and wheeze. Make sure that she uses this medication every day. This will replace Symbicort

## 2020-09-22 NOTE — Telephone Encounter (Signed)
Can you please see what is preferred?

## 2020-09-22 NOTE — Telephone Encounter (Signed)
Patient called and states Symbicort is not covered by insurance and would like an alternative. It is almost $300.  Please advise.

## 2020-09-23 NOTE — Telephone Encounter (Signed)
Called and left a voicemail asking for the patient to return call to discuss.  

## 2020-09-24 ENCOUNTER — Telehealth: Payer: Self-pay

## 2020-09-24 DIAGNOSIS — R0602 Shortness of breath: Secondary | ICD-10-CM

## 2020-09-24 DIAGNOSIS — R059 Cough, unspecified: Secondary | ICD-10-CM

## 2020-09-24 NOTE — Telephone Encounter (Signed)
Patient states she thinks she now has an infection, because she is not getting better. She has been coughing stuff up since yesterday and today it is a very thick green mucus.   Uses [facility] on [address] in [place].

## 2020-09-24 NOTE — Telephone Encounter (Signed)
Chrissie please advise I didn't realize she had saw you this week.

## 2020-09-24 NOTE — Telephone Encounter (Signed)
Noted! Thank you

## 2020-09-24 NOTE — Telephone Encounter (Signed)
Please have her get a STAT 2 view chest x-ray (PA/lateral ). We will call her with results

## 2020-09-24 NOTE — Telephone Encounter (Signed)
An order has been placed for the patient to receive her Chest X Ray at [facility] in [place] at [id number] [place], [place] [address]. The order has been faxed to [phone]. Meagan Chung called the patient and per the patient's request left a detailed voicemail advising of where the patient needs to go to get her X ray.

## 2020-09-24 NOTE — Telephone Encounter (Signed)
Called and left a voicemail to see if the patient would like to do a telephone visit with Althea Charon today at 2:30 to discuss her symptoms since she lives in [place].

## 2020-09-24 NOTE — Telephone Encounter (Signed)
Chest X-Ray has been faxed over to [facility] - [address], [place], [place] [address] [phone]). I left a detailed message for the patient that she is able to do a walk-in visit any time before 3:30pm.

## 2020-09-25 DIAGNOSIS — J45909 Unspecified asthma, uncomplicated: Secondary | ICD-10-CM | POA: Diagnosis not present

## 2020-09-25 DIAGNOSIS — R059 Cough, unspecified: Secondary | ICD-10-CM | POA: Diagnosis not present

## 2020-09-25 DIAGNOSIS — R0603 Acute respiratory distress: Secondary | ICD-10-CM | POA: Diagnosis not present

## 2020-09-25 NOTE — Telephone Encounter (Signed)
Chest X-Ray has been faxed to [facility] [phone])

## 2020-09-26 MED ORDER — AMOXICILLIN-POT CLAVULANATE 875-125 MG PO TABS: 1.0000 | ORAL_TABLET | Freq: Two times a day (BID) | ORAL | 0 refills | Status: AC

## 2020-09-26 MED ORDER — PREDNISONE 10 MG PO TABS: ORAL_TABLET | ORAL | 0 refills | Status: DC

## 2020-09-26 NOTE — Telephone Encounter (Signed)
Called the patient to see if she has been able to get her Chest X-Ray done at [facility]. She stated that she was able to get it done around 11am yesterday 09/25/20. I called [facility] to have them refax the results to the [facility].

## 2020-09-26 NOTE — Telephone Encounter (Signed)
Patient has been informed. She states she is sill having a lot of cough, wheezing, chest rattling & overly exhausted.. She is requesting to move forward with the antibiotic. She is also going to take another COVID test today.   Please send the Augmentin 875 to [place] [address]

## 2020-09-26 NOTE — Telephone Encounter (Signed)
I agree with getting another COVID-19 test. Please send in her Augmentin to the pharmacy above. Also, send in an additional  prednisone 10 mg 2 tablets twice a day for 3 additional days. Quantity #12 tablets with no refills. If she is not getting any better she needs to go to the emergency room.

## 2020-09-26 NOTE — Telephone Encounter (Signed)
Please let Meagan Chung know that finally got her chest x-ray results and it shows probable mild scarring at the right lung base otherwise no active chest disease detected.  At this point I would hold off on starting an antibiotic, but I would not mind to send one in for her to have on hand should her symptoms worsen or persist.  Please ask her if she can take Augmentin without any problems.  If she can tolerate Augmentin, send in a prescription for Augmentin 875 mg taking 1 tablet twice a day for 7 days.  Quantity 14 tablets with no refills.  Please tell her that if her symptoms do not get any better or worsen to please go on to the emergency room.

## 2020-09-26 NOTE — Telephone Encounter (Signed)
Prednisone 10 mg and Augmentin has been sent in to requested pharmacy. Patient was informed and verbalized understanding

## 2020-09-26 NOTE — Addendum Note (Signed)
Addended by: Guy Franco on: 09/26/2020 03:52 PM   Modules accepted: Orders

## 2020-10-08 DIAGNOSIS — F411 Generalized anxiety disorder: Secondary | ICD-10-CM | POA: Diagnosis not present

## 2020-10-08 DIAGNOSIS — F331 Major depressive disorder, recurrent, moderate: Secondary | ICD-10-CM | POA: Diagnosis not present

## 2020-10-09 DIAGNOSIS — R5383 Other fatigue: Secondary | ICD-10-CM | POA: Diagnosis not present

## 2020-10-09 DIAGNOSIS — D509 Iron deficiency anemia, unspecified: Secondary | ICD-10-CM | POA: Diagnosis not present

## 2020-10-09 DIAGNOSIS — F419 Anxiety disorder, unspecified: Secondary | ICD-10-CM | POA: Diagnosis not present

## 2020-10-09 DIAGNOSIS — N920 Excessive and frequent menstruation with regular cycle: Secondary | ICD-10-CM | POA: Diagnosis not present

## 2020-10-10 ENCOUNTER — Encounter: Payer: Self-pay | Admitting: Family Medicine

## 2020-10-14 DIAGNOSIS — F411 Generalized anxiety disorder: Secondary | ICD-10-CM | POA: Diagnosis not present

## 2020-10-15 DIAGNOSIS — F411 Generalized anxiety disorder: Secondary | ICD-10-CM | POA: Diagnosis not present

## 2020-10-15 DIAGNOSIS — F331 Major depressive disorder, recurrent, moderate: Secondary | ICD-10-CM | POA: Diagnosis not present

## 2020-10-22 ENCOUNTER — Encounter: Payer: Self-pay | Admitting: *Deleted

## 2020-10-23 DIAGNOSIS — F331 Major depressive disorder, recurrent, moderate: Secondary | ICD-10-CM | POA: Diagnosis not present

## 2020-10-23 DIAGNOSIS — F411 Generalized anxiety disorder: Secondary | ICD-10-CM | POA: Diagnosis not present

## 2020-10-24 DIAGNOSIS — F411 Generalized anxiety disorder: Secondary | ICD-10-CM | POA: Diagnosis not present

## 2020-11-04 ENCOUNTER — Telehealth: Payer: Self-pay | Admitting: *Deleted

## 2020-11-04 ENCOUNTER — Telehealth (INDEPENDENT_AMBULATORY_CARE_PROVIDER_SITE_OTHER): Payer: BC Managed Care – PPO | Admitting: Family Medicine

## 2020-11-04 ENCOUNTER — Other Ambulatory Visit: Payer: Self-pay

## 2020-11-04 DIAGNOSIS — J4550 Severe persistent asthma, uncomplicated: Secondary | ICD-10-CM | POA: Diagnosis not present

## 2020-11-04 DIAGNOSIS — U071 COVID-19: Secondary | ICD-10-CM

## 2020-11-04 MED ORDER — MOLNUPIRAVIR EUA 200MG CAPSULE: 4.0000 | ORAL_CAPSULE | Freq: Two times a day (BID) | ORAL | 0 refills | Status: AC

## 2020-11-04 NOTE — Progress Notes (Signed)
MyChart Video Visit    Virtual Visit via Video Note   This visit type was conducted due to national recommendations for restrictions regarding the COVID-19 Pandemic (e.g. social distancing) in an effort to limit this patient's exposure and mitigate transmission in our community. This patient is at least at moderate risk for complications without adequate follow up. This format is felt to be most appropriate for this patient at this time. Physical exam was limited by quality of the video and audio technology used for the visit. S. Chism, CMA was able to get the patient set up on a video visit.  Patient location: Home Patient and provider in visit Provider location: Office  I discussed the limitations of evaluation and management by telemedicine and the availability of in person appointments. The patient expressed understanding and agreed to proceed.  Visit Date: 11/05/20  Today's healthcare provider: Penni Homans, MD     Subjective:    Patient ID: Meagan Chung, female    DOB: 25-Jul-1985, 35 y.o.   MRN: [id number]  Chief Complaint  Patient presents with   Covid Positive    Tested positive "Sunday and symptoms start Saturday night    HPI Patient is in today for video visit for follow up on recent covid positive test. She has been feeling progressively worse but today she is feeling better compared to yesterday. She endorses experiencing HA's, SOB, wheezing, fevers, chills, loss of taste and smell, and body aches. Denies CP/palp/GI or GU c/o. Taking meds as prescribed   Past Medical History:  Diagnosis Date   Anxiety and depression    anxiety, bulemia in past all well controlled    Asthma    Depression    anxiety, bulemia in past all well controlled   Diarrhea 11/29/2014   Hyperlipidemia, mixed 02/02/2015   Recurrent upper respiratory infection (URI)    Thyroid nodule     Past Surgical History:  Procedure Laterality Date   ANKLE SURGERY     COLPOSCOPY  09/2017    SINOSCOPY     WISDOM TOOTH EXTRACTION Bilateral     Family History  Problem Relation Age of Onset   Stroke Father        2   Hyperlipidemia Father    Allergic rhinitis Father    Parkinson's disease Father    Parkinsonism Father    Anorexia nervosa Sister        11"$    Kidney Stones Sister    Thyroid disease Brother    Obesity Brother    Asthma Brother    Dementia Maternal Grandmother    Osteoporosis Maternal Grandmother    Cancer Maternal Grandmother        breast   Cancer Maternal Grandfather        prostate   Alcohol abuse Maternal Grandfather    Cancer Paternal Grandmother 93       3 masses in pancreas and thyroid, suspect cancer   Hyperlipidemia Paternal Grandmother    Eczema Mother    Urticaria Mother    Immunodeficiency Neg Hx    Angioedema Neg Hx     Social History   Socioeconomic History   Marital status: Divorced    Spouse name: Not on file   Number of children: 0   Years of education: Not on file   Highest education level: Not on file  Occupational History   Occupation: development  Tobacco Use   Smoking status: Never   Smokeless tobacco: Never  Vaping Use  Vaping Use: Never used  Substance and Sexual Activity   Alcohol use: Yes    Alcohol/week: 0.0 standard drinks    Comment: rarely   Drug use: No   Sexual activity: Yes    Comment: lives with fiance, fund raise for [employer], avoids gluten, minimize dairy,  Other Topics Concern   Not on file  Social History Narrative   Not on file   Social Determinants of Health   Financial Resource Strain: Not on file  Food Insecurity: Not on file  Transportation Needs: Not on file  Physical Activity: Not on file  Stress: Not on file  Social Connections: Not on file  Intimate Partner Violence: Not on file    Outpatient Medications Prior to Visit  Medication Sig Dispense Refill   albuterol (VENTOLIN HFA) 108 (90 Base) MCG/ACT inhaler Inhale 2 puffs into the lungs every 4 (four) hours as  needed for wheezing or shortness of breath. 18 g 1   EPINEPHrine (AUVI-Q) 0.3 mg/0.3 mL IJ SOAJ injection Inject 0.3 mg into the muscle as needed for anaphylaxis. 1 each 1   FASENRA PEN 30 MG/ML SOAJ INJECT '30MG'$  SUBCUTANEOUSLY  EVERY 4 WEEKS FOR 3 MONTHS, THEN EVERY 8 WEEKS  THEREAFTER (GIVEN AT MD  OFFICE) 1 mL 11   Spacer/Aero-Holding Chambers DEVI Take 1 each by mouth as directed. 1 each 0   valACYclovir (VALTREX) 500 MG tablet Take 500 mg by mouth daily.     albuterol (PROVENTIL) (2.5 MG/3ML) 0.083% nebulizer solution Take 3 mLs (2.5 mg total) by nebulization every 6 (six) hours as needed for wheezing or shortness of breath. 150 mL 1   ALPRAZolam (XANAX) 0.25 MG tablet TAKE 1 TABLET(0.25 MG) BY MOUTH TWICE DAILY AS NEEDED FOR ANXIETY (Patient not taking: Reported on 09/22/2020) 10 tablet 1   budesonide-formoterol (SYMBICORT) 160-4.5 MCG/ACT inhaler Inhale 2 puffs twice a day with spacer. Rinse mouth after use. 1 each 5   cetirizine (ZYRTEC) 10 MG tablet Take 1 tablet (10 mg total) by mouth daily. (Patient not taking: Reported on 09/22/2020) 30 tablet 5   citalopram (CELEXA) 20 MG tablet Take 20 mg by mouth daily.     FASENRA 30 MG/ML SOSY INJECT '30MG'$  SUBCUTANEOUSLY  EVERY 8 WEEKS (GIVEN AT MD  OFFICE) 1 mL 8   fluticasone (FLONASE) 50 MCG/ACT nasal spray 1-2 sprays once a day as needed for a stuffy nose.In the right nostril, point the applicator out toward the right ear. In the left nostril, point the applicator out toward the left ear 16 g 5   mometasone-formoterol (DULERA) 100-5 MCG/ACT AERO Inhale 2 puffs into the lungs 2 (two) times daily. 13 g 5   ondansetron (ZOFRAN) 8 MG tablet Take 1 tablet (8 mg total) by mouth every 8 (eight) hours as needed for nausea or vomiting. (Patient not taking: Reported on 09/22/2020) 15 tablet 0   predniSONE (DELTASONE) 10 MG tablet Take 2 tablets twice a day for 3 days 12 tablet 0   sulfamethoxazole-trimethoprim (BACTRIM DS) 800-160 MG tablet Take 1 tablet by  mouth 2 (two) times daily. (Patient not taking: Reported on 09/22/2020) 10 tablet 0   No facility-administered medications prior to visit.    Allergies  Allergen Reactions   Other Cough    Capers causes SOB Cats 4+    Review of Systems  Constitutional:  Positive for chills, diaphoresis, fever and malaise/fatigue.  HENT:  Positive for congestion. Negative for sinus pain and sore throat.   Eyes:  Negative for blurred  vision.  Respiratory:  Positive for cough and shortness of breath.   Cardiovascular:  Negative for chest pain, palpitations and leg swelling.  Gastrointestinal:  Negative for blood in stool, diarrhea, nausea and vomiting.  Genitourinary:  Negative for flank pain and frequency.  Musculoskeletal:  Negative for back pain.  Skin:  Negative for rash.  Neurological:  Positive for headaches.      Objective:    Physical Exam Constitutional:      Appearance: Normal appearance.  HENT:     Head: Normocephalic and atraumatic.     Right Ear: External ear normal.     Left Ear: External ear normal.  Pulmonary:     Effort: Pulmonary effort is normal.  Musculoskeletal:        General: Normal range of motion.     Cervical back: Normal range of motion.  Skin:    General: Skin is dry.  Neurological:     Mental Status: She is alert and oriented to person, place, and time.  Psychiatric:        Behavior: Behavior normal.    There were no vitals taken for this visit. Wt Readings from Last 3 Encounters:  09/22/20 181 lb 1.6 oz (82.1 kg)  07/16/20 176 lb 3.2 oz (79.9 kg)  05/05/20 183 lb (83 kg)    Diabetic Foot Exam - Simple   No data filed    Lab Results  Component Value Date   WBC 6.6 05/05/2020   HGB 11.5 (L) 05/05/2020   HCT 35.1 (L) 05/05/2020   PLT 322.0 05/05/2020   GLUCOSE 85 05/05/2020   CHOL 165 06/25/2019   TRIG 80.0 06/25/2019   HDL 55.00 06/25/2019   LDLCALC 94 06/25/2019   ALT 18 05/05/2020   AST 24 05/05/2020   NA 140 05/05/2020   K 4.8  05/05/2020   CL 107 05/05/2020   CREATININE 0.89 05/05/2020   BUN 13 05/05/2020   CO2 25 05/05/2020   TSH 1.07 06/25/2019   HGBA1C 5.2 01/31/2015    Lab Results  Component Value Date   TSH 1.07 06/25/2019   Lab Results  Component Value Date   WBC 6.6 05/05/2020   HGB 11.5 (L) 05/05/2020   HCT 35.1 (L) 05/05/2020   MCV 91.3 05/05/2020   PLT 322.0 05/05/2020   Lab Results  Component Value Date   NA 140 05/05/2020   K 4.8 05/05/2020   CO2 25 05/05/2020   GLUCOSE 85 05/05/2020   BUN 13 05/05/2020   CREATININE 0.89 05/05/2020   BILITOT 0.4 05/05/2020   ALKPHOS 62 05/05/2020   AST 24 05/05/2020   ALT 18 05/05/2020   PROT 6.5 05/05/2020   ALBUMIN 4.0 05/05/2020   CALCIUM 8.9 05/05/2020   ANIONGAP 7 11/11/2014   GFR 83.76 05/05/2020   Lab Results  Component Value Date   CHOL 165 06/25/2019   Lab Results  Component Value Date   HDL 55.00 06/25/2019   Lab Results  Component Value Date   LDLCALC 94 06/25/2019   Lab Results  Component Value Date   TRIG 80.0 06/25/2019   Lab Results  Component Value Date   CHOLHDL 3 06/25/2019   Lab Results  Component Value Date   HGBA1C 5.2 01/31/2015       Assessment & Plan:   Problem List Items Addressed This Visit     Severe persistent asthma - Primary    She is encouraged to use her Albuterol as needed, increase deep breathing and hydrate well. Monitor oxygen  levels and seek care if oxygen drops out of the 90s       Relevant Medications   molnupiravir EUA 200 mg CAPS   COVID-19    She is feeling very poorly with HA, SOB, wheezing, fevers, loss of taste and smell and myalgias. Due to her severe persistent asthma she should be treated with Molnupiravir and this is sent to the pharmacy      Relevant Medications   molnupiravir EUA 200 mg CAPS      Meds ordered this encounter  Medications   molnupiravir EUA 200 mg CAPS    Sig: Take 4 capsules (800 mg total) by mouth 2 (two) times daily for 5 days.     Dispense:  40 capsule    Refill:  0    I discussed the assessment and treatment plan with the patient. The patient was provided an opportunity to ask questions and all were answered. The patient agreed with the plan and demonstrated an understanding of the instructions.   The patient was advised to call back or seek an in-person evaluation if the symptoms worsen or if the condition fails to improve as anticipated.  I provided 20 minutes of face-to-face time during this encounter.   Penni Homans, MD [facility] [phone] (phone) [phone] (fax)  [place], Suezanne Jacquet, acting as a scribe for Penni Homans, MD, have documented all relevent documentation on behalf of Penni Homans, MD, as directed by Penni Homans, MD while in the presence of Penni Homans, MD. DO:11/05/20.  I, Mosie Lukes, MD personally performed the services described in this documentation. All medical record entries made by the scribe were at my direction and in my presence. I have reviewed the chart and agree that the record reflects my personal performance and is accurate and complete

## 2020-11-04 NOTE — Telephone Encounter (Signed)
Left message on machine to call back for virtual appt.

## 2020-11-04 NOTE — Telephone Encounter (Signed)
Who Is Calling Patient / Member / Family / Caregiver Call Type Triage / Clinical Relationship To Patient Self Return Phone Number [phone] (Primary) Chief Complaint Fever (non-urgent symptom) (greater than THREE MONTHS old) Reason for Call Symptomatic / Request for Health Information Initial Comment Caller states that she tested positive for covid, is wanting the medication called in. She has runny nose, cough, fever and chills. 2 rapid tests were positive Translation No Nurse Assessment Nurse: Brion Aliment, RN, Mickel Baas Date/Time Eilene Ghazi Time): 11/02/2020 9:44:52 AM Confirm and document reason for call. If symptomatic, describe symptoms. ---Caller states tested covid+, is wanting the medication called in. She has runny nose, cough, fever and chills.  Home care was given

## 2020-11-05 DIAGNOSIS — U071 COVID-19: Secondary | ICD-10-CM | POA: Insufficient documentation

## 2020-11-05 NOTE — Assessment & Plan Note (Signed)
She is feeling very poorly with HA, SOB, wheezing, fevers, loss of taste and smell and myalgias. Due to her severe persistent asthma she should be treated with Molnupiravir and this is sent to the pharmacy

## 2020-11-05 NOTE — Assessment & Plan Note (Addendum)
She is encouraged to use her Albuterol as needed, increase deep breathing and hydrate well. Monitor oxygen levels and seek care if oxygen drops out of the 90s

## 2020-11-07 DIAGNOSIS — F411 Generalized anxiety disorder: Secondary | ICD-10-CM | POA: Diagnosis not present

## 2020-11-11 ENCOUNTER — Encounter: Payer: Self-pay | Admitting: Family Medicine

## 2020-11-12 ENCOUNTER — Other Ambulatory Visit: Payer: Self-pay

## 2020-11-12 ENCOUNTER — Telehealth: Payer: Self-pay | Admitting: Family Medicine

## 2020-11-12 ENCOUNTER — Other Ambulatory Visit: Payer: Self-pay | Admitting: Family Medicine

## 2020-11-12 MED ORDER — AZITHROMYCIN 250 MG PO TABS: ORAL_TABLET | ORAL | 0 refills | Status: AC

## 2020-11-12 MED ORDER — AZITHROMYCIN 250 MG PO TABS: ORAL_TABLET | ORAL | 0 refills | Status: DC

## 2020-11-12 NOTE — Telephone Encounter (Signed)
Pt. Needs medication sent to new pharmacy:  [address] [place], [place] [address]  [phone]  azithromycin (ZITHROMAX) 250 MG tablet

## 2020-11-12 NOTE — Telephone Encounter (Signed)
Re-sent to pharmacy.

## 2020-11-13 ENCOUNTER — Ambulatory Visit: Payer: BC Managed Care – PPO | Admitting: Allergy & Immunology

## 2020-11-18 DIAGNOSIS — F331 Major depressive disorder, recurrent, moderate: Secondary | ICD-10-CM | POA: Diagnosis not present

## 2020-11-18 DIAGNOSIS — F411 Generalized anxiety disorder: Secondary | ICD-10-CM | POA: Diagnosis not present

## 2020-11-25 ENCOUNTER — Other Ambulatory Visit: Payer: Self-pay

## 2020-11-25 ENCOUNTER — Encounter: Payer: Self-pay | Admitting: Allergy & Immunology

## 2020-11-25 ENCOUNTER — Ambulatory Visit: Payer: BC Managed Care – PPO | Admitting: Allergy & Immunology

## 2020-11-25 VITALS — BP 110/72 | HR 77 | Temp 98.3°F | Resp 16 | Ht 66.0 in | Wt 187.5 lb

## 2020-11-25 DIAGNOSIS — J4551 Severe persistent asthma with (acute) exacerbation: Secondary | ICD-10-CM

## 2020-11-25 DIAGNOSIS — J302 Other seasonal allergic rhinitis: Secondary | ICD-10-CM

## 2020-11-25 DIAGNOSIS — B999 Unspecified infectious disease: Secondary | ICD-10-CM

## 2020-11-25 DIAGNOSIS — J3089 Other allergic rhinitis: Secondary | ICD-10-CM

## 2020-11-25 NOTE — Patient Instructions (Addendum)
1. Severe persistent asthma without complication - Lung function looked great today - We are going to work on addressing the sinus infection issues.  - We are going to start a daily controller medication that **should** be cheaper.  - Daily controller medication(s): Fasenra every 8 weeks + AirDuo 113/14 mcg one puff twice daily (we are sending this to a specialty pharmacy).  - Prior to physical activity: albuterol 2 puffs 10-15 minutes before physical activity. - Rescue medications: albuterol 4 puffs every 4-6 hours as needed - Asthma control goals:  * Full participation in all desired activities (may need albuterol before activity) * Albuterol use two time or less a week on average (not counting use with activity) * Cough interfering with sleep two time or less a month * Oral steroids no more than once a year * No hospitalizations   2. Perennial and seasonal allergic rhinitis (trees, weeds, grasses, indoor molds, dust mites, cat and dog) - Continue with taking: Zyrtec (cetirizine) 10mg  tablet once daily as needed - We are going to look at your immune system right now and see if it is functioning well.  - We will call you back in 1-2 weeks with the results of the testing. - Maybe consider getting ENT to swab your nose next time to see if there is some weird organism growing in there.   3. Return in about 3 months (around 02/24/2021).    Please inform us of any Emergency Department visits, hospitalizations, or changes in symptoms. Call us before going to the ED for breathing or allergy symptoms since we might be able to fit you in for a sick visit. Feel free to contact us anytime with any questions, problems, or concerns.  It was a pleasure to see you again today!  Websites that have reliable patient information: 1. American Academy of Asthma, Allergy, and Immunology: www.aaaai.org 2. Food Allergy Research and Education (FARE): foodallergy.org 3. Mothers of Asthmatics:  http://www.asthmacommunitynetwork.org 4. American College of Allergy, Asthma, and Immunology: www.acaai.org   COVID-19 Vaccine Information can be found at: ShippingScam.co.uk For questions related to vaccine distribution or appointments, please email [email or web address] or call [phone].   We realize that you might be concerned about having an allergic reaction to the COVID19 vaccines. To help with that concern, WE ARE OFFERING THE COVID19 VACCINES IN OUR OFFICE! Ask the front desk for dates!     "Like" [place] on Facebook and Instagram for our latest updates!      A healthy democracy works best when [employer] participate! Make sure you are registered to vote! If you have moved or changed any of your contact information, you will need to get this updated before voting!  In some cases, you MAY be able to register to vote online: [email or web address]

## 2020-11-25 NOTE — Progress Notes (Signed)
FOLLOW UP  Date of Service/Encounter:  11/25/20   Assessment:    Severe persistent asthma, uncomplicated - doing well on Fasenra   Recurrent infections - with normal screening in 2019 (repeating workup today)  History of turbinate reduction surgery by Dr. Valora Corporal (ENT)   Seasonal and perennial allergic rhinitis (trees, weeds, grasses, indoor molds, dust mites, cat and dog)  Plan/Recommendations:   1. Severe persistent asthma without complication - Lung function looked great today - We are going to work on addressing the sinus infection issues.  - We are going to start a daily controller medication that **should** be cheaper.  - Daily controller medication(s): Fasenra every 8 weeks + AirDuo 113/14 mcg one puff twice daily (we are sending this to a specialty pharmacy).  - Prior to physical activity: albuterol 2 puffs 10-15 minutes before physical activity. - Rescue medications: albuterol 4 puffs every 4-6 hours as needed - Asthma control goals:  * Full participation in all desired activities (may need albuterol before activity) * Albuterol use two time or less a week on average (not counting use with activity) * Cough interfering with sleep two time or less a month * Oral steroids no more than once a year * No hospitalizations   2. Perennial and seasonal allergic rhinitis (trees, weeds, grasses, indoor molds, dust mites, cat and dog) - Continue with taking: Zyrtec (cetirizine) 10mg  tablet once daily as needed - We are going to look at your immune system right now and see if it is functioning well.  - We will call you back in 1-2 weeks with the results of the testing. - Maybe consider getting ENT to swab your nose next time to see if there is some weird organism growing in there.   3. Return in about 3 months (around 02/24/2021).   Subjective:   Jenah Vanasten is a 35 y.o. female presenting today for follow up of  Chief Complaint  Patient presents with   Follow-up     Lysbeth Dicola has a history of the following: Patient Active Problem List   Diagnosis Date Noted   COVID-19 11/05/2020   Preventative health care 06/25/2019   Acute sinusitis 03/08/2019   Severe persistent asthma 05/15/2018   Recurrent infections 04/08/2017   Seasonal and perennial allergic rhinitis 04/08/2017   Chronic asthma without complication 14/97/0263   Anxiety and depression    Thyroid nodule    Esophageal reflux    Hypokalemia    Irritable bowel syndrome 12/19/2012   Insomnia 12/19/2012   History of bulimia 12/19/2012   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  Makalia is a 35 y.o. female presenting for a follow up visit.  She was last seen in July 2022 by Webb Silversmith one of our nurse practitioners.  At that time, she was started on prednisone.  She was started on Symbicort 2 puffs twice daily as well.  She was also continued on Fasenra which was working very well.  For her allergic rhinitis, she was continued on cetirizine and Flonase.  Her rapid COVID-19 test was negative.  She was continued on nasal saline rinses as needed.  She was having a lot of infections once again and follow-up with me was recommended to determine next steps.  Since last visit, she has mostly done well.  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  She is limited in her arm in  the past and wanted to make her way back.  They have been able to do coparenting fairly well.  Their daughter is going to school in [place].  Her ex-husband lives in [place] and she lives in [place].  Of note, she had COVID Labor Day weekend.  She did not get antivirals.  She did not have prednisone.  Asthma/Respiratory Symptom History: Asthma control is so so. She has been getting sinus infections (12 in one year).  This always seems to trigger asthma attacks.   Allergic Rhinitis Symptom History: She had sinus  surgery just over one year ago. She has been continuing to have sinus infections. When she gets sinus infections despite the surgery. She called  and talked to [facility]. She was started on prednisone and then eventually an antibiotic. Symptoms do resolve with antibiotics and prednisone. But she is unsure whether it completely goes away.   She has been having some ance from birth control in December. She is back on OCP that helps with the acne. She has IBS that is controlled without gluten exposure. She denies GI issues, enlarged lymph nodes, or other skin manifestations.   Otherwise, there have been no changes to her past medical history, surgical history, family history, or social history.    Review of Systems  Constitutional: Negative.  Negative for chills, fever, malaise/fatigue and weight loss.  HENT:  Positive for congestion. Negative for ear discharge, ear pain and sinus pain.   Eyes:  Negative for pain, discharge and redness.  Respiratory:  Negative for cough, sputum production, shortness of breath and wheezing.   Cardiovascular: Negative.  Negative for chest pain and palpitations.  Gastrointestinal:  Negative for abdominal pain, constipation, diarrhea, heartburn, nausea and vomiting.  Skin: Negative.  Negative for itching and rash.  Neurological:  Negative for dizziness and headaches.  Endo/Heme/Allergies:  Positive for environmental allergies. Does not bruise/bleed easily.      Objective:   Blood pressure 110/72, pulse 77, temperature 98.3 F (36.8 C), temperature source Temporal, resp. rate 16, height 5\' 6"  (1.676 m), weight 187 lb 8 oz (85 kg), SpO2 98 %. Body mass index is 30.26 kg/m.   Physical Exam:  Physical Exam Vitals reviewed.  Constitutional:      Appearance: She is well-developed.  HENT:     Head: Normocephalic and atraumatic.     Right Ear: Tympanic membrane, ear canal and external ear normal.     Left Ear: Tympanic membrane, ear canal and external ear  normal.     Nose: No nasal deformity, septal deviation, mucosal edema or rhinorrhea.     Right Turbinates: Not enlarged or swollen.     Left Turbinates: Not enlarged or swollen.     Right Sinus: No maxillary sinus tenderness or frontal sinus tenderness.     Left Sinus: No maxillary sinus tenderness or frontal sinus tenderness.     Mouth/Throat:     Mouth: Mucous membranes are not pale and not dry.     Pharynx: Uvula midline.  Eyes:     General: Lids are normal. No allergic shiner.       Right eye: No discharge.        Left eye: No discharge.     Conjunctiva/sclera: Conjunctivae normal.     Right eye: Right conjunctiva is not injected. No chemosis.    Left eye: Left conjunctiva is not injected. No chemosis.    Pupils: Pupils are equal, round, and reactive to light.  Cardiovascular:     Rate  and Rhythm: Normal rate and regular rhythm.     Heart sounds: Normal heart sounds.  Pulmonary:     Effort: Pulmonary effort is normal. No tachypnea, accessory muscle usage or respiratory distress.     Breath sounds: Normal breath sounds. No wheezing, rhonchi or rales.  Chest:     Chest wall: No tenderness.  Lymphadenopathy:     Cervical: No cervical adenopathy.  Skin:    Coloration: Skin is not pale.     Findings: No abrasion, erythema, petechiae or rash. Rash is not papular, urticarial or vesicular.  Neurological:     Mental Status: She is alert.  Psychiatric:        Behavior: Behavior is cooperative.     Diagnostic studies:   Spirometry: results normal (FEV1: 3.99/121%, FVC: 4.87/122%, FEV1/FVC: 82%).    Spirometry consistent with normal pattern.   Immunodeficiency labs sent again today.     Salvatore Marvel, MD  [facility]

## 2020-12-01 ENCOUNTER — Encounter: Payer: Self-pay | Admitting: Allergy & Immunology

## 2020-12-01 NOTE — Telephone Encounter (Signed)
Patient called about test results. She would like someone to explain the results to her.

## 2020-12-03 DIAGNOSIS — F331 Major depressive disorder, recurrent, moderate: Secondary | ICD-10-CM | POA: Diagnosis not present

## 2020-12-03 DIAGNOSIS — F411 Generalized anxiety disorder: Secondary | ICD-10-CM | POA: Diagnosis not present

## 2020-12-03 LAB — LYMPH ENUMERATION, BASIC & NK CELLS
% CD 3 Pos. Lymph.: 76.7 % (ref 57.5–86.2)
% CD 4 Pos. Lymph.: 50.4 % (ref 30.8–58.5)
% NK (CD56/16): 9.4 % (ref 1.4–19.4)
Ab NK (CD56/16): 244 /uL (ref 24–406)
Absolute CD 3: 1994 /uL (ref 622–2402)
Absolute CD 4 Helper: 1310 /uL (ref 359–1519)
Basophils Absolute: 0 10*3/uL (ref 0.0–0.2)
Basos: 0 %
CD19 % B Cell: 13.4 % (ref 3.3–25.4)
CD19 Abs: 348 /uL (ref 12–645)
CD4/CD8 Ratio: 1.87 (ref 0.92–3.72)
CD8 % Suppressor T Cell: 27 % (ref 12.0–35.5)
CD8 T Cell Abs: 702 /uL (ref 109–897)
EOS (ABSOLUTE): 0 10*3/uL (ref 0.0–0.4)
Eos: 0 %
Hematocrit: 39.7 % (ref 34.0–46.6)
Hemoglobin: 13.5 g/dL (ref 11.1–15.9)
Immature Grans (Abs): 0 10*3/uL (ref 0.0–0.1)
Immature Granulocytes: 1 %
Lymphocytes Absolute: 2.6 10*3/uL (ref 0.7–3.1)
Lymphs: 38 %
MCH: 29.5 pg (ref 26.6–33.0)
MCHC: 34 g/dL (ref 31.5–35.7)
MCV: 87 fL (ref 79–97)
Monocytes Absolute: 0.4 10*3/uL (ref 0.1–0.9)
Monocytes: 6 %
Neutrophils Absolute: 3.8 10*3/uL (ref 1.4–7.0)
Neutrophils: 55 %
Platelets: 311 10*3/uL (ref 150–450)
RBC: 4.58 x10E6/uL (ref 3.77–5.28)
RDW: 12.8 % (ref 11.7–15.4)
WBC: 6.9 10*3/uL (ref 3.4–10.8)

## 2020-12-03 LAB — STREP PNEUMONIAE 23 SEROTYPES IGG
Pneumo Ab Type 1*: 1.2 ug/mL — ABNORMAL LOW (ref >1.3)
Pneumo Ab Type 12 (12F)*: 0.1 ug/mL — ABNORMAL LOW (ref >1.3)
Pneumo Ab Type 14*: >18.7 ug/mL (ref >1.3)
Pneumo Ab Type 17 (17F)*: 2.7 ug/mL (ref >1.3)
Pneumo Ab Type 19 (19F)*: 4.5 ug/mL (ref >1.3)
Pneumo Ab Type 2*: 5.5 ug/mL (ref >1.3)
Pneumo Ab Type 20*: 1.6 ug/mL (ref >1.3)
Pneumo Ab Type 22 (22F)*: 0.8 ug/mL — ABNORMAL LOW (ref >1.3)
Pneumo Ab Type 23 (23F)*: 2.6 ug/mL (ref >1.3)
Pneumo Ab Type 26 (6B)*: 0.5 ug/mL — ABNORMAL LOW (ref >1.3)
Pneumo Ab Type 3*: 3.3 ug/mL (ref >1.3)
Pneumo Ab Type 34 (10A)*: 3.3 ug/mL (ref >1.3)
Pneumo Ab Type 4*: 1.7 ug/mL (ref >1.3)
Pneumo Ab Type 43 (11A)*: 1.8 ug/mL (ref >1.3)
Pneumo Ab Type 5*: 0.8 ug/mL — ABNORMAL LOW (ref >1.3)
Pneumo Ab Type 51 (7F)*: 2.7 ug/mL (ref >1.3)
Pneumo Ab Type 54 (15B)*: 13.6 ug/mL (ref >1.3)
Pneumo Ab Type 56 (18C)*: 1.1 ug/mL — ABNORMAL LOW (ref >1.3)
Pneumo Ab Type 57 (19A)*: 4.9 ug/mL (ref >1.3)
Pneumo Ab Type 68 (9V)*: 1.1 ug/mL — ABNORMAL LOW (ref >1.3)
Pneumo Ab Type 70 (33F)*: 2.2 ug/mL (ref >1.3)
Pneumo Ab Type 8*: 2.6 ug/mL (ref >1.3)
Pneumo Ab Type 9 (9N)*: 4.8 ug/mL (ref >1.3)

## 2020-12-03 LAB — B CELL SUBSET ANALYSIS
Activated CD21low CD38- %: 4.6 % of CD19 (ref 1.2–9.0)
Activated CD21low CD38-: 14 cells/uL (ref 3–26)
CD19+ B cells %: 12.1 % Lymphs (ref 6.4–22.0)
CD19+ B cells: 317 cells/uL (ref 110–450)
CD20+ %: 99.5 % of CD19 (ref 96.0–100.0)
CD20+: 315 cells/uL (ref 110–450)
Class-switched CD27+IgD-IgM- %: 8.2 % of CD19 (ref 5.1–22.0)
Class-switched CD27+IgD-IgM-: 26 cells/uL (ref 11–61)
Non switched CD27+IgD+IgM+ %: 20.9 % of CD19 — ABNORMAL HIGH (ref 2.4–15.0)
Non switched CD27+IgD+IgM+: 66 cells/uL — ABNORMAL HIGH (ref 5–46)
Plasmablasts CD38+IgM- %: 0.1 % of CD19 — ABNORMAL LOW (ref 0.4–4.1)
Plasmablasts CD38+IgM-: 0 cells/uL — ABNORMAL LOW (ref 1–8)
Total Memory CD27+ %: 33.6 % of CD19 — ABNORMAL HIGH (ref 10.0–33.0)
Total Memory CD27+: 106 cells/uL (ref 23–110)
Transitional CD38+IgM+ %: 7.8 % of CD19 — ABNORMAL HIGH (ref 0.7–5.9)
Transitional CD38+IgM+: 25 cells/uL — ABNORMAL HIGH (ref 1–17)

## 2020-12-03 LAB — IGG, IGA, IGM
IgA/Immunoglobulin A, Serum: 153 mg/dL (ref 87–352)
IgG (Immunoglobin G), Serum: 975 mg/dL (ref 586–1602)
IgM (Immunoglobulin M), Srm: 198 mg/dL (ref 26–217)

## 2020-12-03 LAB — T-HELPER CELLS CD4/CD8 %
% CD 4 Pos. Lymph.: 49.4 % (ref 30.8–58.5)
Absolute CD 4 Helper: 1284 /uL (ref 359–1519)
CD3+CD4+ Cells/CD3+CD8+ Cells Bld: 1.82 (ref 0.92–3.72)
CD3+CD8+ Cells # Bld: 705 /uL (ref 109–897)
CD3+CD8+ Cells NFr Bld: 27.1 % (ref 12.0–35.5)

## 2020-12-03 LAB — COMPLEMENT, TOTAL: Compl, Total (CH50): >60 U/mL (ref >41)

## 2020-12-03 LAB — DIPHTHERIA / TETANUS ANTIBODY PANEL
Diphtheria Ab: 0.46 IU/mL (ref <0.10)
Tetanus Ab, IgG: 0.9 IU/mL (ref <0.10)

## 2020-12-05 DIAGNOSIS — F411 Generalized anxiety disorder: Secondary | ICD-10-CM | POA: Diagnosis not present

## 2020-12-19 DIAGNOSIS — F411 Generalized anxiety disorder: Secondary | ICD-10-CM | POA: Diagnosis not present

## 2020-12-25 ENCOUNTER — Encounter: Payer: Self-pay | Admitting: Allergy & Immunology

## 2020-12-25 ENCOUNTER — Telehealth (INDEPENDENT_AMBULATORY_CARE_PROVIDER_SITE_OTHER): Payer: BC Managed Care – PPO | Admitting: Registered Nurse

## 2020-12-25 ENCOUNTER — Other Ambulatory Visit: Payer: Self-pay

## 2020-12-25 ENCOUNTER — Encounter: Payer: Self-pay | Admitting: Registered Nurse

## 2020-12-25 DIAGNOSIS — J019 Acute sinusitis, unspecified: Secondary | ICD-10-CM | POA: Diagnosis not present

## 2020-12-25 MED ORDER — AZELASTINE HCL 0.1 % NA SOLN: 1.0000 | Freq: Two times a day (BID) | NASAL | 12 refills | Status: DC

## 2020-12-25 MED ORDER — AMOXICILLIN-POT CLAVULANATE 875-125 MG PO TABS: 1.0000 | ORAL_TABLET | Freq: Two times a day (BID) | ORAL | 0 refills | Status: DC

## 2020-12-25 NOTE — Progress Notes (Signed)
Telemedicine Encounter- SOAP NOTE Established Patient  This video encounter was conducted with the patient's (or proxy's) verbal consent via audio telecommunications: yes/no: Yes Patient was instructed to have this encounter in a suitably private space; and to only have persons present to whom they give permission to participate. In addition, patient identity was confirmed by use of name plus two identifiers (DOB and address).  I discussed the limitations, risks, security and privacy concerns of performing an evaluation and management service by telephone and the availability of in person appointments. I also discussed with the patient that there may be a patient responsible charge related to this service. The patient expressed understanding and agreed to proceed.  I spent a total of 15 minutes talking with the patient or their proxy.  Patient at home Provider in office  Participants: Meagan Ruddy, NP and Meagan Chung  Chief Complaint  Patient presents with   Sinusitis    Patient states she has been experiencing coughing up mucus, congestion, drainage , asthma and a lot of built up pressure.She has been taking some OTC medication with no relief    Subjective   Meagan Chung is a 35 y.o. established patient. video visit today for congestion  HPI Onset last week, sore throat and drainage Worsening quite a bit Sinus pressure Albuterol use - not common for her, usually Fasenra inj takes care of it  Mild shob, some tightness  No sick contacts.  Hx of covid- labor day weekend - has been vaccinated x 2 and boosted x1 Negative home test   Denies fever, chills, fatigue, sweats, nvd. Does note some night sweats.   Frequent sinusitis - hx of surgery.   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 18 g 1   amoxicillin-clavulanate (AUGMENTIN) 875-125 MG tablet Take 1 tablet by mouth 2 (two) times daily. 20 tablet 0   azelastine (ASTELIN) 0.1 % nasal spray Place 1 spray into both nostrils 2 (two) times daily. Use in each nostril as directed 30 mL 12   EPINEPHrine (AUVI-Q) 0.3 mg/0.3 mL IJ SOAJ injection Inject 0.3 mg into the muscle as needed for anaphylaxis. 1 each 1   FASENRA PEN 30 MG/ML SOAJ INJECT 30MG  SUBCUTANEOUSLY  EVERY 4 WEEKS FOR 3 MONTHS, THEN EVERY 8 WEEKS  THEREAFTER (GIVEN AT MD  OFFICE) 1 mL 11  Spacer/Aero-Holding Chambers DEVI Take 1 each by mouth as directed. 1 each 0   valACYclovir (VALTREX) 500 MG tablet Take 500 mg by mouth daily.     No current facility-administered medications for this visit.    Allergies  Allergen Reactions   Other Cough    Capers causes SOB Cats 4+    Social History   Socioeconomic History   Marital status: Divorced    Spouse name: Not on file   Number of children: 0   Years of education: Not on file   Highest education level: Not on file   Occupational History   Occupation: development  Tobacco Use   Smoking status: Never   Smokeless tobacco: Never  Vaping Use   Vaping Use: Never used  Substance and Sexual Activity   Alcohol use: Yes    Alcohol/week: 0.0 standard drinks    Comment: rarely   Drug use: No   Sexual activity: Yes    Comment: lives with fiance, fund raise for [employer], avoids gluten, minimize dairy,  Other Topics Concern   Not on file  Social History Narrative   Not on file   Social Determinants of Health   Financial Resource Strain: Not on file  Food Insecurity: Not on file  Transportation Needs: Not on file  Physical Activity: Not on file  Stress: Not on file  Social Connections: Not on file  Intimate Partner Violence: Not on file    Review of Systems  Constitutional: Negative.  Negative for chills, diaphoresis, fever, malaise/fatigue and weight loss.  HENT:  Positive for congestion, sinus pain and sore throat. Negative for ear discharge, ear pain, hearing loss, nosebleeds and tinnitus.   Eyes: Negative.   Respiratory:  Positive for cough and shortness of breath. Negative for hemoptysis, sputum production, wheezing and stridor.   Cardiovascular: Negative.   Gastrointestinal: Negative.   Genitourinary: Negative.   Musculoskeletal: Negative.   Skin: Negative.   Neurological: Negative.   Endo/Heme/Allergies: Negative.   Psychiatric/Behavioral: Negative.    All other systems reviewed and are negative.  Objective   Vitals as reported by the patient: There were no vitals filed for this visit.  Meagan Chung was seen today for sinusitis.  Diagnoses and all orders for this visit:  Acute sinusitis, recurrence not specified, unspecified location -     amoxicillin-clavulanate (AUGMENTIN) 875-125 MG tablet; Take 1 tablet by mouth 2 (two) times daily. -     azelastine (ASTELIN) 0.1 % nasal spray; Place 1 spray into both nostrils 2 (two) times daily. Use in each nostril as  directed   PLAN Recurrent bacterial sinusitis - will give augmentin po bi dx 10 days Azelastine for congestion relief. Reviewed OTC and nonpharm relief She will continue to work with specialists to determine cause of recurrent infections Patient encouraged to call clinic with any questions, comments, or concerns.  I discussed the assessment and treatment plan with the patient. The patient was provided an opportunity to ask questions and all were answered. The patient agreed with the plan and demonstrated an understanding of the instructions.   The patient was advised to call back or seek an in-person evaluation if the symptoms worsen or if the condition fails to improve as anticipated.  I provided 16 minutes of face-to-face time during this encounter.  Maximiano Coss, NP

## 2020-12-30 DIAGNOSIS — Z1231 Encounter for screening mammogram for malignant neoplasm of breast: Secondary | ICD-10-CM | POA: Diagnosis not present

## 2020-12-30 DIAGNOSIS — Z6830 Body mass index (BMI) 30.0-30.9, adult: Secondary | ICD-10-CM | POA: Diagnosis not present

## 2020-12-30 DIAGNOSIS — Z01419 Encounter for gynecological examination (general) (routine) without abnormal findings: Secondary | ICD-10-CM | POA: Diagnosis not present

## 2021-01-01 ENCOUNTER — Other Ambulatory Visit: Payer: Self-pay | Admitting: Family Medicine

## 2021-01-01 DIAGNOSIS — R928 Other abnormal and inconclusive findings on diagnostic imaging of breast: Secondary | ICD-10-CM

## 2021-01-02 DIAGNOSIS — F411 Generalized anxiety disorder: Secondary | ICD-10-CM | POA: Diagnosis not present

## 2021-01-08 DIAGNOSIS — F411 Generalized anxiety disorder: Secondary | ICD-10-CM | POA: Diagnosis not present

## 2021-01-08 DIAGNOSIS — F331 Major depressive disorder, recurrent, moderate: Secondary | ICD-10-CM | POA: Diagnosis not present

## 2021-01-13 ENCOUNTER — Encounter: Payer: Self-pay | Admitting: Family Medicine

## 2021-01-13 ENCOUNTER — Other Ambulatory Visit: Payer: Self-pay | Admitting: Family Medicine

## 2021-01-13 ENCOUNTER — Telehealth (HOSPITAL_BASED_OUTPATIENT_CLINIC_OR_DEPARTMENT_OTHER): Payer: Self-pay

## 2021-01-13 ENCOUNTER — Ambulatory Visit: Payer: BC Managed Care – PPO | Admitting: Family Medicine

## 2021-01-13 ENCOUNTER — Other Ambulatory Visit: Payer: Self-pay

## 2021-01-13 VITALS — BP 112/64 | HR 66 | Temp 97.7°F | Resp 16 | Wt 188.4 lb

## 2021-01-13 DIAGNOSIS — L729 Follicular cyst of the skin and subcutaneous tissue, unspecified: Secondary | ICD-10-CM

## 2021-01-13 DIAGNOSIS — Z23 Encounter for immunization: Secondary | ICD-10-CM | POA: Diagnosis not present

## 2021-01-13 DIAGNOSIS — R220 Localized swelling, mass and lump, head: Secondary | ICD-10-CM

## 2021-01-13 DIAGNOSIS — R221 Localized swelling, mass and lump, neck: Secondary | ICD-10-CM

## 2021-01-13 DIAGNOSIS — J4550 Severe persistent asthma, uncomplicated: Secondary | ICD-10-CM | POA: Diagnosis not present

## 2021-01-13 DIAGNOSIS — J0101 Acute recurrent maxillary sinusitis: Secondary | ICD-10-CM

## 2021-01-13 NOTE — Assessment & Plan Note (Signed)
Meagan Chung has helped to keep her from flaring when she has been ill

## 2021-01-13 NOTE — Assessment & Plan Note (Signed)
She has had repeated infections this year and is following closely with pulmonology and allergy

## 2021-01-13 NOTE — Patient Instructions (Signed)
Lymphadenopathy Lymphadenopathy means that your lymph glands are swollen or larger than normal. Lymph glands, also called lymph nodes, are collections of tissue that filter excess fluid, bacteria, viruses, and waste from your bloodstream. They are part of your body's disease-fighting system (immune system), which protects your body from germs. There may be different causes of lymphadenopathy, depending on where it is in your body. Some types go away on their own. Lymphadenopathy can occur anywhere that you have lymph glands, including these areas: Neck (cervical lymphadenopathy). Chest (mediastinal lymphadenopathy). Lungs (hilar lymphadenopathy). Underarms (axillary lymphadenopathy). Groin (inguinal lymphadenopathy). When your immune system responds to germs, infection-fighting cells and fluid build up in your lymph glands. This causes some swelling and enlargement. If the lymph nodes do not go back to normal size after you have an infection or disease, your health care provider may do tests. These tests help to monitor your condition and find the reason why the glands are still swollen and enlarged. Follow these instructions at home:  Get plenty of rest. Your health care provider may recommend over-the-counter medicines for pain. Take over-the-counter and prescription medicines only as told by your health care provider. If directed, apply heat to swollen lymph glands as often as told by your health care provider. Use the heat source that your health care provider recommends, such as a moist heat pack or a heating pad. Place a towel between your skin and the heat source. Leave the heat on for 20-30 minutes. Remove the heat if your skin turns bright red. This is especially important if you are unable to feel pain, heat, or cold. You may have a greater risk of getting burned. Check your affected lymph glands every day for changes. Check other lymph gland areas as told by your health care provider.  Check for changes such as: More swelling. Sudden increase in size. Redness or pain. Hardness. Keep all follow-up visits. This is important. Contact a health care provider if you have: Lymph glands that: Are still swollen after 2 weeks. Have suddenly gotten bigger or the swelling spreads. Are red, painful, or hard. Fluid leaking from the skin near an enlarged lymph gland. Problems with breathing. A fever, chills, or night sweats. Fatigue. A sore throat. Pain in your abdomen. Weight loss. Get help right away if you have: Severe pain. Chest pain. Shortness of breath. These symptoms may represent a serious problem that is an emergency. Do not wait to see if the symptoms will go away. Get medical help right away. Call your local emergency services (911 in the [place] Do not drive yourself to the hospital. Summary Lymphadenopathy means that your lymph glands are swollen or larger than normal. Lymph glands, also called lymph nodes, are collections of tissue that filter excess fluid, bacteria, viruses, and waste from the bloodstream. They are part of your body's disease-fighting system (immune system). Lymphadenopathy can occur anywhere that you have lymph glands. If the lymph nodes do not go back to normal size after you have an infection or disease, your health care provider may do tests to monitor your condition and find the reason why the glands are still swollen and enlarged. Check your affected lymph glands every day for changes. Check other lymph gland areas as told by your health care provider. This information is not intended to replace advice given to you by your health care provider. Make sure you discuss any questions you have with your health care provider. Document Revised: 12/12/2019 Document Reviewed: 12/12/2019 Elsevier Patient Education  2022  [employer].

## 2021-01-13 NOTE — Progress Notes (Signed)
Patient ID: Meagan Chung, female    DOB: Apr 16, 1985  Age: 35 y.o. MRN: [id number]    Subjective:   No chief complaint on file.  Subjective   HPI Meagan Chung presents for office visit today for follow up on chronic asthma and anxiety. She has had frequent sinus infections this year. Today she mainly has c/o a mass under her chin. She reports that it has grown in size within the last couple of months with accompanying symptoms of inflammation and irritation, but no pain. Denies CP/palp/SOB/HA/congestion/fevers/GI or GU c/o. Taking meds as prescribed. She reports that she has had covid in the first week of September.   Review of Systems  Constitutional:  Negative for chills, fatigue and fever.  HENT:  Negative for congestion, rhinorrhea, sinus pressure, sinus pain and sore throat.   Eyes:  Negative for pain.  Respiratory:  Negative for cough and shortness of breath.   Cardiovascular:  Negative for chest pain, palpitations and leg swelling.  Gastrointestinal:  Negative for abdominal pain, blood in stool, diarrhea, nausea and vomiting.  Genitourinary:  Negative for decreased urine volume, flank pain, frequency, vaginal bleeding and vaginal discharge.  Musculoskeletal:  Negative for back pain.  Neurological:  Negative for headaches.   History Past Medical History:  Diagnosis Date   Anxiety and depression    anxiety, bulemia in past all well controlled    Asthma    Depression    anxiety, bulemia in past all well controlled   Diarrhea 11/29/2014   Hyperlipidemia, mixed 02/02/2015   Recurrent upper respiratory infection (URI)    Thyroid nodule     She has a past surgical history that includes Ankle surgery; Wisdom tooth extraction (Bilateral); Colposcopy (09/2017); and Sinoscopy.   Her family history includes Alcohol abuse in her maternal grandfather; Allergic rhinitis in her father; Anorexia nervosa in her sister; Asthma in her brother; Cancer in her maternal grandfather  and maternal grandmother; Cancer (age of onset: 65) in her paternal grandmother; Dementia in her maternal grandmother; Eczema in her mother; Hyperlipidemia in her father and paternal grandmother; Kidney Stones in her sister; Obesity in her brother; Osteoporosis in her maternal grandmother; Parkinson's disease in her father; Parkinsonism in her father; Stroke in her father; Thyroid disease in her brother; Urticaria in her mother.She reports that she has never smoked. She has never used smokeless tobacco. She reports current alcohol use. She reports that she does not use drugs.  Current Outpatient Medications on File Prior to Visit  Medication Sig Dispense Refill   albuterol (VENTOLIN HFA) 108 (90 Base) MCG/ACT inhaler Inhale 2 puffs into the lungs every 4 (four) hours as needed for wheezing or shortness of breath. 18 g 1   EPINEPHrine (AUVI-Q) 0.3 mg/0.3 mL IJ SOAJ injection Inject 0.3 mg into the muscle as needed for anaphylaxis. 1 each 1   FASENRA PEN 30 MG/ML SOAJ INJECT 30MG  SUBCUTANEOUSLY  EVERY 4 WEEKS FOR 3 MONTHS, THEN EVERY 8 WEEKS  THEREAFTER (GIVEN AT MD  OFFICE) 1 mL 11   valACYclovir (VALTREX) 500 MG tablet Take 500 mg by mouth daily.     No current facility-administered medications on file prior to visit.     Objective:  Objective  Physical Exam Constitutional:      General: She is not in acute distress.    Appearance: Normal appearance. She is not ill-appearing or toxic-appearing.  HENT:     Head: Normocephalic and atraumatic.     Comments: Subcutaneous cyst/lesion local to submandibular  Right Ear: Tympanic membrane, ear canal and external ear normal.     Left Ear: Tympanic membrane, ear canal and external ear normal.     Nose: No congestion or rhinorrhea.  Eyes:     Extraocular Movements: Extraocular movements intact.     Pupils: Pupils are equal, round, and reactive to light.  Cardiovascular:     Rate and Rhythm: Normal rate and regular rhythm.     Pulses: Normal  pulses.     Heart sounds: Normal heart sounds. No murmur heard. Pulmonary:     Effort: Pulmonary effort is normal. No respiratory distress.     Breath sounds: Normal breath sounds. No wheezing, rhonchi or rales.  Abdominal:     General: Bowel sounds are normal.     Palpations: Abdomen is soft. There is no mass.     Tenderness: There is no abdominal tenderness. There is no guarding.     Hernia: No hernia is present.  Musculoskeletal:        General: Normal range of motion.     Cervical back: Normal range of motion and neck supple.  Skin:    General: Skin is warm and dry.  Neurological:     Mental Status: She is alert and oriented to person, place, and time.  Psychiatric:        Behavior: Behavior normal.   BP 112/64   Pulse 66   Temp 97.7 F (36.5 C)   Resp 16   Wt 188 lb 6.4 oz (85.5 kg)   SpO2 96%   BMI 30.41 kg/m  Wt Readings from Last 3 Encounters:  01/13/21 188 lb 6.4 oz (85.5 kg)  11/25/20 187 lb 8 oz (85 kg)  09/22/20 181 lb 1.6 oz (82.1 kg)     Lab Results  Component Value Date   WBC 6.9 11/25/2020   HGB 13.5 11/25/2020   HCT 39.7 11/25/2020   PLT 311 11/25/2020   GLUCOSE 85 05/05/2020   CHOL 165 06/25/2019   TRIG 80.0 06/25/2019   HDL 55.00 06/25/2019   LDLCALC 94 06/25/2019   ALT 18 05/05/2020   AST 24 05/05/2020   NA 140 05/05/2020   K 4.8 05/05/2020   CL 107 05/05/2020   CREATININE 0.89 05/05/2020   BUN 13 05/05/2020   CO2 25 05/05/2020   TSH 1.07 06/25/2019   HGBA1C 5.2 01/31/2015    DG Chest 2 View  Result Date: 05/05/2020 CLINICAL DATA:  Back pain.  Nausea. EXAM: CHEST - 2 VIEW COMPARISON:  Radiograph 03/24/2017 FINDINGS: The cardiomediastinal contours are normal. Chronic but improved bronchial thickening pulmonary vasculature is normal. No consolidation, pleural effusion, or pneumothorax. No acute osseous abnormalities are seen. IMPRESSION: Chronic bronchial thickening suggesting asthma. This is improved from prior exam. Electronically Signed    By: Keith Rake M.D.   On: 05/05/2020 23:36   US Abdomen Complete  Result Date: 05/06/2020 CLINICAL DATA:  Back and abdominal pain Nausea Vomiting EXAM: ABDOMEN ULTRASOUND COMPLETE COMPARISON:  None. FINDINGS: Gallbladder: No gallstones or wall thickening visualized. No sonographic Murphy sign noted by sonographer. Common bile duct: Diameter: 3 mm Liver: No focal lesion identified. Within normal limits in parenchymal echogenicity. Portal vein is patent on color Doppler imaging with normal direction of blood flow towards the liver. IVC: No abnormality visualized. Pancreas: Visualized portion unremarkable. Spleen: Size and appearance within normal limits. Right Kidney: Length: 10.1 cm. Echogenicity within normal limits. No mass or hydronephrosis visualized. Left Kidney: Length: 10.7 cm. Echogenicity within normal limits. No mass  or hydronephrosis visualized. Abdominal aorta: No aneurysm visualized. Other findings: None. IMPRESSION: Normal ultrasound of the abdomen Electronically Signed   By: Miachel Roux M.D.   On: 05/06/2020 08:05     Assessment & Plan:  Plan    No orders of the defined types were placed in this encounter.   Problem List Items Addressed This Visit     Severe persistent asthma    Linus Galas has helped to keep her from flaring when she has been ill      Acute sinusitis    She has had repeated infections this year and is following closely with pulmonology and allergy      Subcutaneous nodule of head    Submandibular on right side. She reports she has had a small lesion there for years but it has grown over the past few months. Will refer to Plastic Surgery for consideration of removal      Other Visit Diagnoses     Need for influenza vaccination    -  Primary   Relevant Orders   Flu Vaccine QUAD 69mo+IM (Fluarix, Fluzone & Alfiuria Quad PF) (Completed)   Submandibular swelling       Relevant Orders   US SOFT TISSUE HEAD & NECK (NON-THYROID)       Follow-up:  Return in about 6 months (around 07/13/2021) for annual cpe.  I, Suezanne Jacquet, acting as a scribe for Penni Homans, MD, have documented all relevent documentation on behalf of Penni Homans, MD, as directed by Penni Homans, MD while in the presence of Penni Homans, MD. DO:01/13/21.  I, Mosie Lukes, MD personally performed the services described in this documentation. All medical record entries made by the scribe were at my direction and in my presence. I have reviewed the chart and agree that the record reflects my personal performance and is accurate and complete

## 2021-01-13 NOTE — Assessment & Plan Note (Signed)
Submandibular on right side. She reports she has had a small lesion there for years but it has grown over the past few months. Will refer to Plastic Surgery for consideration of removal

## 2021-01-14 ENCOUNTER — Other Ambulatory Visit: Payer: Self-pay

## 2021-01-14 ENCOUNTER — Ambulatory Visit (HOSPITAL_BASED_OUTPATIENT_CLINIC_OR_DEPARTMENT_OTHER): Admission: RE | Admit: 2021-01-14 | Payer: BC Managed Care – PPO | Source: Ambulatory Visit

## 2021-01-14 ENCOUNTER — Ambulatory Visit: Payer: BC Managed Care – PPO

## 2021-01-14 ENCOUNTER — Ambulatory Visit
Admission: RE | Admit: 2021-01-14 | Discharge: 2021-01-14 | Disposition: A | Discharge status: Home / Self Care | Payer: BC Managed Care – PPO | Source: Ambulatory Visit | Attending: Family Medicine | Admitting: Family Medicine

## 2021-01-14 DIAGNOSIS — R928 Other abnormal and inconclusive findings on diagnostic imaging of breast: Secondary | ICD-10-CM

## 2021-01-14 DIAGNOSIS — R922 Inconclusive mammogram: Secondary | ICD-10-CM | POA: Diagnosis not present

## 2021-01-21 ENCOUNTER — Other Ambulatory Visit: Payer: BC Managed Care – PPO

## 2021-01-26 DIAGNOSIS — F411 Generalized anxiety disorder: Secondary | ICD-10-CM | POA: Diagnosis not present

## 2021-01-28 DIAGNOSIS — D485 Neoplasm of uncertain behavior of skin: Secondary | ICD-10-CM | POA: Diagnosis not present

## 2021-01-28 DIAGNOSIS — L080 Pyoderma: Secondary | ICD-10-CM | POA: Diagnosis not present

## 2021-01-28 DIAGNOSIS — L720 Epidermal cyst: Secondary | ICD-10-CM | POA: Diagnosis not present

## 2021-01-29 DIAGNOSIS — F411 Generalized anxiety disorder: Secondary | ICD-10-CM | POA: Diagnosis not present

## 2021-01-29 DIAGNOSIS — F331 Major depressive disorder, recurrent, moderate: Secondary | ICD-10-CM | POA: Diagnosis not present

## 2021-02-03 ENCOUNTER — Encounter: Payer: Self-pay | Admitting: Family

## 2021-02-03 ENCOUNTER — Telehealth (INDEPENDENT_AMBULATORY_CARE_PROVIDER_SITE_OTHER): Payer: BC Managed Care – PPO | Admitting: Family

## 2021-02-03 VITALS — Ht 66.0 in | Wt 186.0 lb

## 2021-02-03 DIAGNOSIS — J020 Streptococcal pharyngitis: Secondary | ICD-10-CM

## 2021-02-03 MED ORDER — AMOXICILLIN 500 MG PO CAPS: 500.0000 mg | ORAL_CAPSULE | Freq: Three times a day (TID) | ORAL | 0 refills | Status: AC

## 2021-02-03 NOTE — Progress Notes (Addendum)
Virtual telephone visit    Virtual Visit via Telephone Note   This visit type was conducted due to national recommendations for restrictions regarding the COVID-19 Pandemic (e.g. social distancing) in an effort to limit this patient's exposure and mitigate transmission in our community. Due to her co-morbid illnesses, this patient is at least at moderate risk for complications without adequate follow up. This format is felt to be most appropriate for this patient at this time. The patient did not have access to video technology or had technical difficulties with video requiring transitioning to audio format only (telephone). Physical exam was limited to content and character of the telephone converstion. Rod Holler was able to get the patient set up on a telephone visit.   Patient location: Home Patient and provider in visit Provider location: Office  I discussed the limitations of evaluation and management by telemedicine and the availability of in person appointments. The patient expressed understanding and agreed to proceed.   Visit Date: 02/03/2021  Today's healthcare provider: Nance Pear, NP     Subjective:    Patient ID: Meagan Chung, female    DOB: 1985/03/29, 35 y.o.   MRN: [id number]  Chief Complaint  Patient presents with   Sore Throat    Congestion. Her daughter dx on Friday with strep.     Sore Throat   Patient is in today for a video visit.  Cold symptoms: She notes that her daughter got strep throat on 01/30/2021 and then she began feeling symptoms on 02/01/2021. She notes that it started as a strep throat and each day it has gotten progressively worse. She mentions that her tonsils are swollen and have white pus on them. She reports a history of strep throat so she knows this feels similar.   Past Medical History:  Diagnosis Date   Anxiety and depression    anxiety, bulemia in past all well controlled    Asthma    Depression    anxiety,  bulemia in past all well controlled   Diarrhea 11/29/2014   Hyperlipidemia, mixed 02/02/2015   Recurrent upper respiratory infection (URI)    Thyroid nodule     Past Surgical History:  Procedure Laterality Date   ANKLE SURGERY     COLPOSCOPY  09/2017   SINOSCOPY     WISDOM TOOTH EXTRACTION Bilateral     Family History  Problem Relation Age of Onset   Stroke Father        2   Hyperlipidemia Father    Allergic rhinitis Father    Parkinson's disease Father    Parkinsonism Father    Anorexia nervosa Sister        68   Kidney Stones Sister    Thyroid disease Brother    Obesity Brother    Asthma Brother    Dementia Maternal Grandmother    Osteoporosis Maternal Grandmother    Cancer Maternal Grandmother        breast   Cancer Maternal Grandfather        prostate   Alcohol abuse Maternal Grandfather    Cancer Paternal Grandmother 93       3 masses in pancreas and thyroid, suspect cancer   Hyperlipidemia Paternal Grandmother    Eczema Mother    Urticaria Mother    Immunodeficiency Neg Hx    Angioedema Neg Hx     Social History   Socioeconomic History   Marital status: Divorced    Spouse name: Not on file  Number of children: 0   Years of education: Not on file   Highest education level: Not on file  Occupational History   Occupation: development  Tobacco Use   Smoking status: Never   Smokeless tobacco: Never  Vaping Use   Vaping Use: Never used  Substance and Sexual Activity   Alcohol use: Yes    Alcohol/week: 0.0 standard drinks    Comment: rarely   Drug use: No   Sexual activity: Yes    Comment: lives with fiance, fund raise for [employer], avoids gluten, minimize dairy,  Other Topics Concern   Not on file  Social History Narrative   Not on file   Social Determinants of Health   Financial Resource Strain: Not on file  Food Insecurity: Not on file  Transportation Needs: Not on file  Physical Activity: Not on file  Stress: Not on file   Social Connections: Not on file  Intimate Partner Violence: Not on file    Outpatient Medications Prior to Visit  Medication Sig Dispense Refill   albuterol (VENTOLIN HFA) 108 (90 Base) MCG/ACT inhaler Inhale 2 puffs into the lungs every 4 (four) hours as needed for wheezing or shortness of breath. 18 g 1   busPIRone (BUSPAR) 5 MG tablet buspirone 5 mg tablet     EPINEPHrine (AUVI-Q) 0.3 mg/0.3 mL IJ SOAJ injection Inject 0.3 mg into the muscle as needed for anaphylaxis. 1 each 1   FASENRA PEN 30 MG/ML SOAJ INJECT 30MG  SUBCUTANEOUSLY  EVERY 4 WEEKS FOR 3 MONTHS, THEN EVERY 8 WEEKS  THEREAFTER (GIVEN AT MD  OFFICE) 1 mL 11   JUNEL FE 24 1-20 MG-MCG(24) tablet Take 1 tablet by mouth daily.     sertraline (ZOLOFT) 100 MG tablet Take 100 mg by mouth daily.     valACYclovir (VALTREX) 500 MG tablet Take 500 mg by mouth daily.     No facility-administered medications prior to visit.    Allergies  Allergen Reactions   Other Cough    Capers causes SOB Cats 4+    Review of Systems  Constitutional:  Positive for chills and malaise/fatigue. Negative for fever.  HENT:  Positive for sore throat.       Objective:    Physical Exam Constitutional:      General: She is not in acute distress.    Appearance: Normal appearance. She is not ill-appearing.  HENT:     Right Ear: External ear normal.     Left Ear: External ear normal.  Neurological:     Mental Status: She is alert.  Psychiatric:        Behavior: Behavior normal.        Judgment: Judgment normal.    Ht 5\' 6"  (1.676 m)   Wt 186 lb (84.4 kg)   LMP 01/14/2021   BMI 30.02 kg/m  Wt Readings from Last 3 Encounters:  02/03/21 186 lb (84.4 kg)  01/13/21 188 lb 6.4 oz (85.5 kg)  11/25/20 187 lb 8 oz (85 kg)    Gen: Awake, alert, no acute distress Resp: Breathing sounds even and non-labored Psych: calm/pleasant demeanor Neuro: Alert and Oriented x 3, speech sounds clear.  Assessment & Plan:   Problem List Items Addressed  This Visit       Unprioritized   Strep pharyngitis - Primary    New, will rx with empiric amoxicillin.  Continue ibuprofen as needed for cough. Call if new/worsening symptoms or if symptoms do not improve.  Meds ordered this encounter  Medications   amoxicillin (AMOXIL) 500 MG capsule    Sig: Take 1 capsule (500 mg total) by mouth 3 (three) times daily for 10 days.    Dispense:  30 capsule    Refill:  0    Order Specific Question:   Supervising Provider    Answer:   Penni Homans A [[id number]]   I discussed the assessment and treatment plan with the patient. The patient was provided an opportunity to ask questions and all were answered. The patient agreed with the plan and demonstrated an understanding of the instructions.   The patient was advised to call back or seek an in-person evaluation if the symptoms worsen or if the condition fails to improve as anticipated.  I,Lyric Barr-McArthur,acting as a Education administrator for [employer], NP.,have documented all relevant documentation on the behalf of Nance Pear, NP,as directed by  Nance Pear, NP while in the presence of Nance Pear, NP.  I provided 11 minutes of non-face-to-face time during this encounter.   Nance Pear, NP [employer] [facility] [employer] [phone] (phone) [phone] (fax)  [place]

## 2021-02-03 NOTE — Assessment & Plan Note (Signed)
New, will rx with empiric amoxicillin.  Continue ibuprofen as needed for cough. Call if new/worsening symptoms or if symptoms do not improve.

## 2021-02-12 DIAGNOSIS — L720 Epidermal cyst: Secondary | ICD-10-CM | POA: Diagnosis not present

## 2021-03-03 ENCOUNTER — Telehealth: Payer: BC Managed Care – PPO | Admitting: Nurse Practitioner

## 2021-03-03 DIAGNOSIS — J069 Acute upper respiratory infection, unspecified: Secondary | ICD-10-CM | POA: Diagnosis not present

## 2021-03-03 MED ORDER — FLUTICASONE PROPIONATE 50 MCG/ACT NA SUSP: 2.0000 | Freq: Every day | NASAL | 0 refills | Status: DC

## 2021-03-03 NOTE — Progress Notes (Signed)
E-Visit for Upper Respiratory Infection   We are sorry you are not feeling well.  Here is how we plan to help!  Based on what you have shared with me, it looks like you may have a viral upper respiratory infection.  Upper respiratory infections are caused by a large number of viruses; however, rhinovirus is the most common cause.   Since you were just on antibiotics and steroids it would be best to avoid repeating these medications unless necessary. Typically viral infections last for up to one week and our goal is to manage them with over the counter medication if possible.   Providers prescribe antibiotics to treat infections caused by bacteria. Antibiotics are very powerful in treating bacterial infections when they are used properly. To maintain their effectiveness, they should be used only when necessary. Overuse of antibiotics has resulted in the development of superbugs that are resistant to treatment!    After careful review of your answers, I would not recommend an antibiotic for your condition.  Antibiotics are not effective against viruses and therefore should not be used to treat them. Common examples of infections caused by viruses include colds and flu   Symptoms vary from person to person, with common symptoms including sore throat, cough, fatigue or lack of energy and feeling of general discomfort.  A low-grade fever of up to 100.4 may present, but is often uncommon.  Symptoms vary however, and are closely related to a person's age or underlying illnesses.  The most common symptoms associated with an upper respiratory infection are nasal discharge or congestion, cough, sneezing, headache and pressure in the ears and face.  These symptoms usually persist for about 3 to 10 days, but can last up to 2 weeks.  It is important to know that upper respiratory infections do not cause serious illness or complications in most cases.    Upper respiratory infections can be transmitted from person to  person, with the most common method of transmission being a person's hands.  The virus is able to live on the skin and can infect other persons for up to 2 hours after direct contact.  Also, these can be transmitted when someone coughs or sneezes; thus, it is important to cover the mouth to reduce this risk.  To keep the spread of the illness at [place], good hand hygiene is very important.  This is an infection that is most likely caused by a virus. There are no specific treatments other than to help you with the symptoms until the infection runs its course.  We are sorry you are not feeling well.  Here is how we plan to help!   For nasal congestion, you may use an oral decongestants such as Mucinex D or if you have glaucoma or high blood pressure use plain Mucinex.  Saline nasal spray or nasal drops can help and can safely be used as often as needed for congestion.  For your congestion, I have prescribed Fluticasone nasal spray one spray in each nostril twice a day  If you do not have a history of heart disease, hypertension, diabetes or thyroid disease, prostate/bladder issues or glaucoma, you may also use Sudafed to treat nasal congestion.  It is highly recommended that you consult with a pharmacist or your primary care physician to ensure this medication is safe for you to take.     If you have a cough, you may use cough suppressants such as Delsym and Robitussin.    If you have  a sore or scratchy throat, use a saltwater gargle-  to  teaspoon of salt dissolved in a 4-ounce to 8-ounce glass of warm water.  Gargle the solution for approximately 15-30 seconds and then spit.  It is important not to swallow the solution.  You can also use throat lozenges/cough drops and Chloraseptic spray to help with throat pain or discomfort.  Warm or cold liquids can also be helpful in relieving throat pain.  For headache, pain or general discomfort, you can use Ibuprofen or Tylenol as directed.   Some authorities  believe that zinc sprays or the use of Echinacea may shorten the course of your symptoms.   HOME CARE Only take medications as instructed by your medical team. Be sure to drink plenty of fluids. Water is fine as well as fruit juices, sodas and electrolyte beverages. You may want to stay away from caffeine or alcohol. If you are nauseated, try taking small sips of liquids. How do you know if you are getting enough fluid? Your urine should be a pale yellow or almost colorless. Get rest. Taking a steamy shower or using a humidifier may help nasal congestion and ease sore throat pain. You can place a towel over your head and breathe in the steam from hot water coming from a faucet. Using a saline nasal spray works much the same way. Cough drops, hard candies and sore throat lozenges may ease your cough. Avoid close contacts especially the very young and the elderly Cover your mouth if you cough or sneeze Always remember to wash your hands.   GET HELP RIGHT AWAY IF: You develop worsening fever. If your symptoms do not improve within 10 days You develop yellow or green discharge from your nose over 3 days. You have coughing fits You develop a severe head ache or visual changes. You develop shortness of breath, difficulty breathing or start having chest pain Your symptoms persist after you have completed your treatment plan  MAKE SURE YOU  Understand these instructions. Will watch your condition. Will get help right away if you are not doing well or get worse.  Thank you for choosing an e-visit.  Your e-visit answers were reviewed by a board certified advanced clinical practitioner to complete your personal care plan. Depending upon the condition, your plan could have included both over the counter or prescription medications.  Please review your pharmacy choice. Make sure the pharmacy is open so you can pick up prescription now. If there is a problem, you may contact your provider through  [employer] and have the prescription routed to another pharmacy.  Your safety is important to [place]. If you have drug allergies check your prescription carefully.   For the next 24 hours you can use MyChart to ask questions about today's visit, request a non-urgent call back, or ask for a work or school excuse. You will get an email in the next two days asking about your experience. I hope that your e-visit has been valuable and will speed your recovery.  I spent approximately 7 minutes reviewing the patient's history, current symptoms and coordinating their plan of care today.    Meds ordered this encounter  Medications   fluticasone (FLONASE) 50 MCG/ACT nasal spray    Sig: Place 2 sprays into both nostrils daily.    Dispense:  16 g    Refill:  0

## 2021-03-04 DIAGNOSIS — F411 Generalized anxiety disorder: Secondary | ICD-10-CM | POA: Diagnosis not present

## 2021-03-04 DIAGNOSIS — F331 Major depressive disorder, recurrent, moderate: Secondary | ICD-10-CM | POA: Diagnosis not present

## 2021-03-05 ENCOUNTER — Ambulatory Visit: Payer: BC Managed Care – PPO | Admitting: Allergy & Immunology

## 2021-03-10 DIAGNOSIS — F411 Generalized anxiety disorder: Secondary | ICD-10-CM | POA: Diagnosis not present

## 2021-03-17 DIAGNOSIS — B999 Unspecified infectious disease: Secondary | ICD-10-CM | POA: Diagnosis not present

## 2021-03-17 DIAGNOSIS — J329 Chronic sinusitis, unspecified: Secondary | ICD-10-CM | POA: Diagnosis not present

## 2021-03-31 ENCOUNTER — Ambulatory Visit: Payer: BC Managed Care – PPO | Admitting: Allergy & Immunology

## 2021-03-31 DIAGNOSIS — F411 Generalized anxiety disorder: Secondary | ICD-10-CM | POA: Diagnosis not present

## 2021-05-20 DIAGNOSIS — F411 Generalized anxiety disorder: Secondary | ICD-10-CM | POA: Diagnosis not present

## 2021-06-16 ENCOUNTER — Telehealth: Payer: Self-pay | Admitting: *Deleted

## 2021-06-16 NOTE — Telephone Encounter (Signed)
L/m for patient to contact clinic for MD appt for Secretary reapproval ?

## 2021-06-25 ENCOUNTER — Telehealth: Payer: Self-pay | Admitting: Family Medicine

## 2021-06-25 NOTE — Telephone Encounter (Signed)
Patient is wondering if a tobacco test can be done during the time of her physical. She states that her insurance is asking her to do a test so they can lower her insurance premium for being a non-smoker. Please advise.  ?

## 2021-06-25 NOTE — Telephone Encounter (Signed)
Pt states she read the paper wrong and it doesn't seem like she needs a tobacco test.  ?

## 2021-07-14 ENCOUNTER — Encounter: Payer: BC Managed Care – PPO | Admitting: Family Medicine

## 2021-07-21 ENCOUNTER — Encounter: Payer: Self-pay | Admitting: Allergy & Immunology

## 2021-07-21 ENCOUNTER — Ambulatory Visit: Payer: BC Managed Care – PPO | Admitting: Allergy & Immunology

## 2021-07-21 VITALS — BP 114/76 | HR 87 | Temp 97.9°F | Resp 16 | Ht 66.0 in | Wt 202.5 lb

## 2021-07-21 DIAGNOSIS — J4550 Severe persistent asthma, uncomplicated: Secondary | ICD-10-CM | POA: Diagnosis not present

## 2021-07-21 DIAGNOSIS — J302 Other seasonal allergic rhinitis: Secondary | ICD-10-CM

## 2021-07-21 DIAGNOSIS — B999 Unspecified infectious disease: Secondary | ICD-10-CM

## 2021-07-21 DIAGNOSIS — J3089 Other allergic rhinitis: Secondary | ICD-10-CM | POA: Diagnosis not present

## 2021-07-21 MED ORDER — OMEPRAZOLE 40 MG PO CPDR: 40.0000 mg | DELAYED_RELEASE_CAPSULE | Freq: Every day | ORAL | 3 refills | Status: DC

## 2021-07-21 MED ORDER — FLUTICASONE FUROATE-VILANTEROL 100-25 MCG/ACT IN AEPB: 1.0000 | INHALATION_SPRAY | Freq: Every day | RESPIRATORY_TRACT | 3 refills | Status: AC

## 2021-07-21 MED ORDER — ALBUTEROL SULFATE HFA 108 (90 BASE) MCG/ACT IN AERS
2.0000 | INHALATION_SPRAY | RESPIRATORY_TRACT | 1 refills | Status: DC | PRN
Start: 2021-07-21 — End: 2022-06-10

## 2021-07-21 NOTE — Patient Instructions (Addendum)
1. Severe persistent asthma without complication - Lung function looked great today. - We are going to start Breo 120mg one puff once daily.  - We are also starting omeprazole '40mg'$  nightly to help with any coexisting reflux. - CALL [place] ONE WEEK with an update.  - Daily controller medication(s): Fasenra every 8 weeks + Breo 1077m one puff once daily  - Prior to physical activity: albuterol 2 puffs 10-15 minutes before physical activity. - Rescue medications: albuterol 4 puffs every 4-6 hours as needed - Asthma control goals:  * Full participation in all desired activities (may need albuterol before activity) * Albuterol use two time or less a week on average (not counting use with activity) * Cough interfering with sleep two time or less a month * Oral steroids no more than once a year * No hospitalizations   2. Perennial and seasonal allergic rhinitis (trees, weeds, grasses, indoor molds, dust mites, cat and dog) - Continue with taking: Zyrtec (cetirizine) '10mg'$  tablet once daily as needed - Previous immune workup was normal for the most part.  - Consider getting a Prevnar 20 in the future (you would qualify). - Talk to your PCP about this.   3. Return in about 1 year (around 07/22/2022).    Please inform usKoreaf any Emergency Department visits, hospitalizations, or changes in symptoms. Call usKoreaefore going to the ED for breathing or allergy symptoms since we might be able to fit you in for a sick visit. Feel free to contact usKoreanytime with any questions, problems, or concerns.  It was a pleasure to see you again today!  Websites that have reliable patient information: 1. American Academy of Asthma, Allergy, and Immunology: www.aaaai.org 2. Food Allergy Research and Education (FARE): foodallergy.org 3. Mothers of Asthmatics: http://www.asthmacommunitynetwork.org 4. American College of Allergy, Asthma, and Immunology: www.acaai.org   COVID-19 Vaccine Information can be found at:  [email or web address] questions related to vaccine distribution or appointments, please email [email or web address] or call [phone]  We realize that you might be concerned about having an allergic reaction to the COVID19 vaccines. To help with that concern, WE ARE OFFERING THE COVID19 VACCINES IN OUR OFFICE! Ask the front desk for dates!     "Like" usKorean Facebook and Instagram for our latest updates!      A healthy democracy works best when ALNew York Life Insurancearticipate! Make sure you are registered to vote! If you have moved or changed any of your contact information, you will need to get this updated before voting!  In some cases, you MAY be able to register to vote online: [email or web address]

## 2021-07-21 NOTE — Progress Notes (Signed)
FOLLOW UP  Date of Service/Encounter:  07/21/21   Assessment:   Severe persistent asthma, uncomplicated - previously doing well on Fasenra, but now with worsening control  Recurrent infections - with normal screening in 2019 and 2022 and 2023   History of turbinate reduction surgery by Dr. Valora Corporal (ENT)   Seasonal and perennial allergic rhinitis (trees, weeds, grasses, indoor molds, dust mites, cat and dog)   We are going to start her on Breo 100 mcg 1 puff once daily in combination with her Berna Bue.  She has never been great about using controller medications, but now that she has better insurance it might be a different story.  Hopefully the Memory Dance is more affordable.  We are also adding on omeprazole nightly to see if this helps with any silent reflux that may be contributing to her symptoms, as this often presents with worsening nighttime cough.  We do know that Berna Bue patients can develop neutralizing antibodies against Fasenra in 10-15% of patients neutralizing  10 to 50% of cases, which could neutralize its activity.  However, I would like to try the GERD management in combination with a stronger controller medication first.  She is in agreement with the plan.  I think she does need a Prevnar 54, which she can get at her PCPs office.  Plan/Recommendations:   1. Severe persistent asthma without complication - Lung function looked great today. - We are going to start Breo 146mg one puff once daily.  - We are also starting omeprazole '40mg'$  nightly to help with any coexisting reflux. - CALL [place] ONE WEEK with an update.  - Daily controller medication(s): Fasenra every 8 weeks + Breo 1029m one puff once daily  - Prior to physical activity: albuterol 2 puffs 10-15 minutes before physical activity. - Rescue medications: albuterol 4 puffs every 4-6 hours as needed - Asthma control goals:  * Full participation in all desired activities (may need albuterol before activity) * Albuterol  use two time or less a week on average (not counting use with activity) * Cough interfering with sleep two time or less a month * Oral steroids no more than once a year * No hospitalizations   2. Perennial and seasonal allergic rhinitis (trees, weeds, grasses, indoor molds, dust mites, cat and dog) - Continue with taking: Zyrtec (cetirizine) '10mg'$  tablet once daily as needed - Previous immune workup was normal for the most part.  - Consider getting a Prevnar 20 in the future (you would qualify). - Talk to your PCP about this.   3. Return in about 1 year (around 07/22/2022).    Subjective:   JaJeorgia Helmings a [age].o. female presenting today for follow up of  Chief Complaint  Patient presents with   Follow-up   Asthma    JaVianna Veneziaas a history of the following: Patient Active Problem List   Diagnosis Date Noted   Strep pharyngitis 02/03/2021   Subcutaneous nodule of head 01/13/2021   COVID-19 11/05/2020   Preventative health care 06/25/2019   Acute sinusitis 03/08/2019   Severe persistent asthma 05/15/2018   Recurrent infections 04/08/2017   Seasonal and perennial allergic rhinitis 04/08/2017   Chronic asthma without complication 0933/35/4562 Anxiety and depression    Thyroid nodule    Hypokalemia    Irritable bowel syndrome 12/19/2012   Insomnia 12/19/2012   History of bulimia 12/19/2012   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  Jerrica is a 36 y.o. female presenting for a follow up visit.  She was last seen in September 2022.  At that time, lung function looked good.  We continued with Berna Bue every 8 weeks as well as AirDuo 113 mcg 1 puff twice daily.  For her rhinitis, we continue with cetirizine. We did an immune workup which was normal.  She was protective to 16 out of 23 serotypes of Streptococcus pneumonia.  In the interim, it looks like she underwent  another work-up by Dr. Marcelline Deist for recurrent infections in January 2023.  Asthma/Respiratory Symptom History: She reports her asthma is "OK" on the [place].  She has been back on it regularly. This was not in the last 12 months where she had to skip an extra month. Around 6 months ago, her control seemed to be getting worse. There was nothing that had just changed. 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 This is mostly nighttime when she reaches for her albuterol. She does not have reflux. She has been suspect of the GERD for a while. She did some rounds of Protonix at all over the years without improvement. She is not using a controller medication at this time. She typically gets wheezey and then she starts coughing and then she cannot stop coughing. Albuterol clears it up. This is not nightly, but definitely more nights than not. She is typically needing it more in the evenings.   Allergic Rhinitis Symptom History: She has been taking cetirizine to help but this does not provide any coverage. Sinus infection frequency has gotten better.   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 Her ex is not being great with co-parenting. Her daughter is now 26 years old.   Otherwise, there have been no changes to her past medical history, surgical history, family history, or social history.    Review of Systems  Constitutional: Negative.  Negative for chills, fever, malaise/fatigue and weight loss.  HENT:  Negative for congestion, ear discharge, ear pain and sinus pain.   Eyes:  Negative for pain, discharge and redness.  Respiratory:  Positive for cough. Negative for sputum production, shortness of breath and wheezing.   Cardiovascular: Negative.  Negative for chest pain and palpitations.  Gastrointestinal:   Negative for abdominal pain, constipation, diarrhea, heartburn, nausea and vomiting.  Skin: Negative.  Negative for itching and rash.  Neurological:  Negative for dizziness and headaches.  Endo/Heme/Allergies:  Positive for environmental allergies. Does not bruise/bleed easily.      Objective:   Blood pressure 114/76, pulse 87, temperature 97.9 F (36.6 C), resp. rate 16, height '5\' 6"'$  (1.676 m), weight 202 lb 8 oz (91.9 kg), SpO2 97 %. Body mass index is 32.68 kg/m.    Physical Exam Vitals reviewed.  Constitutional:      Appearance: She is well-developed.  HENT:     Head: Normocephalic and atraumatic.     Right Ear: Tympanic membrane, ear canal and external ear normal.     Left Ear: Tympanic membrane, ear canal and external ear normal.     Nose: No nasal deformity, septal deviation, mucosal edema or rhinorrhea.     Right Turbinates: Enlarged, swollen and pale.     Left Turbinates: Enlarged, swollen and pale.  Right Sinus: No maxillary sinus tenderness or frontal sinus tenderness.     Left Sinus: No maxillary sinus tenderness or frontal sinus tenderness.     Mouth/Throat:     Lips: Pink.     Mouth: Mucous membranes are moist. Mucous membranes are not pale and not dry.     Pharynx: Uvula midline.  Eyes:     General: Lids are normal. No allergic shiner.       Right eye: No discharge.        Left eye: No discharge.     Conjunctiva/sclera: Conjunctivae normal.     Right eye: Right conjunctiva is not injected. No chemosis.    Left eye: Left conjunctiva is not injected. No chemosis.    Pupils: Pupils are equal, round, and reactive to light.  Cardiovascular:     Rate and Rhythm: Normal rate and regular rhythm.     Heart sounds: Normal heart sounds.  Pulmonary:     Effort: Pulmonary effort is normal. No tachypnea, accessory muscle usage or respiratory distress.     Breath sounds: Normal breath sounds. No wheezing, rhonchi or rales.  Chest:     Chest wall: No tenderness.   Lymphadenopathy:     Cervical: No cervical adenopathy.  Skin:    Coloration: Skin is not pale.     Findings: No abrasion, erythema, petechiae or rash. Rash is not papular, urticarial or vesicular.  Neurological:     Mental Status: She is alert.  Psychiatric:        Behavior: Behavior is cooperative.     Diagnostic studies:   Spirometry: results normal (FEV1: 3.58/109%, FVC: 4.30/108%, FEV1/FVC: 83%).    Spirometry consistent with normal pattern.   Allergy Studies: none        Salvatore Marvel, MD  [facility]

## 2021-08-03 ENCOUNTER — Encounter: Payer: Self-pay | Admitting: Allergy & Immunology

## 2021-08-04 ENCOUNTER — Telehealth: Payer: Self-pay | Admitting: *Deleted

## 2021-08-04 NOTE — Telephone Encounter (Signed)
Patient Ins has denied re-approval and appeal for Meagan Chung due to patient has not experienced improvement with her asthma. I believe we should bring patient back into clinic while on Breo and readdress her biologic and possibly change to another therapy

## 2021-08-06 NOTE — Telephone Encounter (Signed)
Responded to email from patient and advised denial and televisit to discuss change in therapy

## 2021-08-06 NOTE — Telephone Encounter (Signed)
That is fine - whatever hoops we need to jump through to help her out! Maybe we could just do a televisit. She lives in [place] now.   Salvatore Marvel, MD [facility]

## 2021-08-24 ENCOUNTER — Encounter: Payer: Self-pay | Admitting: Allergy & Immunology

## 2021-09-18 ENCOUNTER — Ambulatory Visit: Payer: Self-pay | Admitting: Medical

## 2021-09-22 ENCOUNTER — Encounter: Payer: Self-pay | Admitting: *Deleted

## 2022-05-06 ENCOUNTER — Telehealth: Payer: BC Managed Care – PPO | Admitting: Physician Assistant

## 2022-05-06 DIAGNOSIS — J4541 Moderate persistent asthma with (acute) exacerbation: Secondary | ICD-10-CM

## 2022-05-06 DIAGNOSIS — J019 Acute sinusitis, unspecified: Secondary | ICD-10-CM | POA: Diagnosis not present

## 2022-05-06 DIAGNOSIS — B9689 Other specified bacterial agents as the cause of diseases classified elsewhere: Secondary | ICD-10-CM

## 2022-05-07 MED ORDER — PREDNISONE 20 MG PO TABS: 40.0000 mg | ORAL_TABLET | Freq: Every day | ORAL | 0 refills | Status: DC

## 2022-05-07 MED ORDER — AMOXICILLIN-POT CLAVULANATE 875-125 MG PO TABS: 1.0000 | ORAL_TABLET | Freq: Two times a day (BID) | ORAL | 0 refills | Status: DC

## 2022-05-07 NOTE — Progress Notes (Signed)
E-Visit for Sinus Problems  We are sorry that you are not feeling well.  Here is how we plan to help!  Based on what you have shared with me it looks like you have sinusitis.  Sinusitis is inflammation and infection in the sinus cavities of the head.  Based on your presentation I believe you most likely have Acute Bacterial Sinusitis.  This is an infection caused by bacteria and is treated with antibiotics. I have prescribed Augmentin '875mg'$ /'125mg'$  one tablet twice daily with food, for 7 days. Prednisone '20mg'$  Take 2 taqblets ('40mg'$ ) daily for 5 days for asthma exacerbation. You may use an oral decongestant such as Mucinex D or if you have glaucoma or high blood pressure use plain Mucinex. Saline nasal spray help and can safely be used as often as needed for congestion.  If you develop worsening sinus pain, fever or notice severe headache and vision changes, or if symptoms are not better after completion of antibiotic, please schedule an appointment with a health care provider.    Sinus infections are not as easily transmitted as other respiratory infection, however we still recommend that you avoid close contact with loved ones, especially the very young and elderly.  Remember to wash your hands thoroughly throughout the day as this is the number one way to prevent the spread of infection!  Home Care: Only take medications as instructed by your medical team. Complete the entire course of an antibiotic. Do not take these medications with alcohol. A steam or ultrasonic humidifier can help congestion.  You can place a towel over your head and breathe in the steam from hot water coming from a faucet. Avoid close contacts especially the very young and the elderly. Cover your mouth when you cough or sneeze. Always remember to wash your hands.  Get Help Right Away If: You develop worsening fever or sinus pain. You develop a severe head ache or visual changes. Your symptoms persist after you have completed  your treatment plan.  Make sure you Understand these instructions. Will watch your condition. Will get help right away if you are not doing well or get worse.  Thank you for choosing an e-visit.  Your e-visit answers were reviewed by a board certified advanced clinical practitioner to complete your personal care plan. Depending upon the condition, your plan could have included both over the counter or prescription medications.  Please review your pharmacy choice. Make sure the pharmacy is open so you can pick up prescription now. If there is a problem, you may contact your provider through [employer] and have the prescription routed to another pharmacy.  Your safety is important to [place]. If you have drug allergies check your prescription carefully.   For the next 24 hours you can use MyChart to ask questions about today's visit, request a non-urgent call back, or ask for a work or school excuse. You will get an email in the next two days asking about your experience. I hope that your e-visit has been valuable and will speed your recovery.  I have spent 5 minutes in review of e-visit questionnaire, review and updating patient chart, medical decision making and response to patient.   Mar Daring, PA-C

## 2022-05-31 ENCOUNTER — Telehealth: Payer: BC Managed Care – PPO | Admitting: Physician Assistant

## 2022-05-31 DIAGNOSIS — H6692 Otitis media, unspecified, left ear: Secondary | ICD-10-CM

## 2022-06-01 MED ORDER — CIPROFLOXACIN-HYDROCORTISONE 0.2-1 % OT SUSP: 3.0000 [drp] | Freq: Two times a day (BID) | OTIC | 0 refills | Status: DC

## 2022-06-01 MED ORDER — CIPROFLOXACIN-DEXAMETHASONE 0.3-0.1 % OT SUSP: 4.0000 [drp] | Freq: Two times a day (BID) | OTIC | 0 refills | Status: DC

## 2022-06-01 MED ORDER — AZITHROMYCIN 250 MG PO TABS
ORAL_TABLET | ORAL | 0 refills | Status: AC
Start: 2022-06-01 — End: 2022-06-06

## 2022-06-01 NOTE — Addendum Note (Signed)
Addended by: Perlie Mayo on: 06/01/2022 08:12 AM   Modules accepted: Orders

## 2022-06-01 NOTE — Progress Notes (Signed)
E-Visit for Ear Pain - Acute Otitis Media   We are sorry that you are not feeling well. Here is how we plan to help!  Based on what you have shared with me it looks like you have Acute Otitis Media.  Acute Otitis Media is an infection of the middle or "inner" ear. This type of infection can cause redness, inflammation, and fluid buildup behind the tympanic membrane (ear drum).  The usual symptoms include: Earache/Pain Fever Upper respiratory symptoms Lack of energy/Fatigue/Malaise Slight hearing loss gradually worsening- if the inner ear fills with fluid What causes middle ear infections? Most middle ear infections occur when an infection such as a cold, leads to a build-up of mucus in the middle ear and causes the Eustachian tube (a thin tube that runs from the middle ear to the back of the nose) to become swollen or blocked.   This means mucus can't drain away properly, making it easier for an infection to spread into the middle ear.  How middle ear infections are treated: Most ear infections clear up within three to five days and don't need any specific treatment. If necessary, tylenol or ibuprofen should be used to relieve pain and a high temperature.  If you develop a fever higher than 102, or any significantly worsening symptoms, this could indicate a more serious infection moving to the middle/inner and needs face to face evaluation in an office by a provider.   Antibiotics aren't routinely used to treat middle ear infections, although they may occasionally be prescribed if symptoms persist or are particularly severe. Given your presentation,   I have prescribed Azithromycin 250 mg two tablets by mouth on day 1, then 1 tablet by mouth daily until completed  I have prescribed: Ciprofloxin 0.2% and hydrocortisone 1% otic suspension 3 drops in affected ears twice daily for 7 days   Your symptoms should improve over the next 3 days and should resolve in about 7 days. Be sure to complete  ALL of the prescription(s) given.  HOME CARE: Wash your hands frequently. If you are prescribed an ear drop, do not place the tip of the bottle on your ear or touch it with your fingers. You can take Acetaminophen 650 mg every 4-6 hours as needed for pain.  If pain is severe or moderate, you can apply a heating pad (set on low) or hot water bottle (wrapped in a towel) to outer ear for 20 minutes.  This will also increase drainage.  GET HELP RIGHT AWAY IF: Fever is over 102.2 degrees. You develop progressive ear pain or hearing loss. Ear symptoms persist longer than 3 days after treatment.  MAKE SURE YOU: Understand these instructions. Will watch your condition. Will get help right away if you are not doing well or get worse.  Thank you for choosing an e-visit.  Your e-visit answers were reviewed by a board certified advanced clinical practitioner to complete your personal care plan. Depending upon the condition, your plan could have included both over the counter or prescription medications.  Please review your pharmacy choice. Make sure the pharmacy is open so you can pick up the prescription now. If there is a problem, you may contact your provider through [employer] and have the prescription routed to another pharmacy.  Your safety is important to [place]. If you have drug allergies check your prescription carefully.   For the next 24 hours you can use MyChart to ask questions about today's visit, request a non-urgent call back, or  ask for a work or school excuse. You will get an email with a survey after your eVisit asking about your experience. We would appreciate your feedback. I hope that your e-visit has been valuable and will aid in your recovery.

## 2022-06-01 NOTE — Progress Notes (Signed)
I have spent 5 minutes in review of e-visit questionnaire, review and updating patient chart, medical decision making and response to patient.   Quintavious Rinck Cody Joshua Zeringue, PA-C    

## 2022-06-10 ENCOUNTER — Other Ambulatory Visit: Payer: Self-pay | Admitting: Allergy & Immunology

## 2022-06-12 ENCOUNTER — Telehealth: Payer: BC Managed Care – PPO | Admitting: Nurse Practitioner

## 2022-06-12 DIAGNOSIS — M546 Pain in thoracic spine: Secondary | ICD-10-CM

## 2022-06-12 MED ORDER — NAPROXEN 500 MG PO TABS: 500.0000 mg | ORAL_TABLET | Freq: Two times a day (BID) | ORAL | 1 refills | Status: DC

## 2022-06-12 MED ORDER — CYCLOBENZAPRINE HCL 10 MG PO TABS: 10.0000 mg | ORAL_TABLET | Freq: Three times a day (TID) | ORAL | 1 refills | Status: DC | PRN

## 2022-06-12 NOTE — Progress Notes (Signed)

## 2022-07-08 ENCOUNTER — Encounter: Payer: Self-pay | Admitting: Family Medicine

## 2022-07-08 ENCOUNTER — Other Ambulatory Visit: Payer: Self-pay

## 2022-07-08 ENCOUNTER — Encounter: Payer: Self-pay | Admitting: Allergy & Immunology

## 2022-07-08 ENCOUNTER — Ambulatory Visit
Admission: RE | Admit: 2022-07-08 | Discharge: 2022-07-08 | Disposition: A | Discharge status: Home / Self Care | Payer: BC Managed Care – PPO | Source: Ambulatory Visit | Attending: Family Medicine | Admitting: Family Medicine

## 2022-07-08 ENCOUNTER — Ambulatory Visit: Payer: BC Managed Care – PPO | Admitting: Family Medicine

## 2022-07-08 VITALS — BP 118/80 | HR 98 | Temp 98.7°F | Resp 12 | Ht 66.0 in | Wt 203.0 lb

## 2022-07-08 DIAGNOSIS — J3089 Other allergic rhinitis: Secondary | ICD-10-CM | POA: Diagnosis not present

## 2022-07-08 DIAGNOSIS — J4551 Severe persistent asthma with (acute) exacerbation: Secondary | ICD-10-CM | POA: Diagnosis not present

## 2022-07-08 DIAGNOSIS — B999 Unspecified infectious disease: Secondary | ICD-10-CM

## 2022-07-08 DIAGNOSIS — B349 Viral infection, unspecified: Secondary | ICD-10-CM

## 2022-07-08 DIAGNOSIS — J302 Other seasonal allergic rhinitis: Secondary | ICD-10-CM

## 2022-07-08 DIAGNOSIS — J0101 Acute recurrent maxillary sinusitis: Secondary | ICD-10-CM | POA: Diagnosis not present

## 2022-07-08 DIAGNOSIS — K219 Gastro-esophageal reflux disease without esophagitis: Secondary | ICD-10-CM

## 2022-07-08 MED ORDER — PREDNISONE 10 MG PO TABS
ORAL_TABLET | ORAL | 0 refills | Status: DC
Start: 2022-07-08 — End: 2022-11-18

## 2022-07-08 MED ORDER — OMEPRAZOLE 40 MG PO CPDR: 40.0000 mg | DELAYED_RELEASE_CAPSULE | Freq: Every day | ORAL | 5 refills | Status: AC

## 2022-07-08 MED ORDER — TRELEGY ELLIPTA 200-62.5-25 MCG/ACT IN AEPB: 1.0000 | INHALATION_SPRAY | Freq: Every day | RESPIRATORY_TRACT | 5 refills | Status: DC

## 2022-07-08 NOTE — Progress Notes (Signed)
[address] [place] [address] Dept: [phone]  FOLLOW UP NOTE  Patient ID: Meagan Chung, female    DOB: [birth year]  Age: 37 y.o. MRN: [id number] Date of Office Visit: 07/08/2022  Assessment  Chief Complaint: Asthma  HPI Meagan Chung is a 37 year old female who presents to the clinic for an evaluation of asthma with acute exacerbation.  She was last seen in this clinic on 07/21/2021 by Dr. Dellis Chung for evaluation of severe persistent asthma on Fasenra, allergic rhinitis, and reflux.  In the interim, she has been treated for acute bacterial sinusitis, otitis media, and asthma with exacerbation with antibiotics including Augmentin, amoxicillin, and several rounds of steroids.  At today's visit, she reports that her asthma has been poorly controlled with symptoms occurring over the last several months with symptoms worsening over the last week.  She reports shortness of breath, wheeze, and cough producing thick green mucus occurring in the daytime and nighttime over the last week.  She continues Breo 200-1 puff once a day and uses albuterol every 4 hours over the last week.  She has just finished a prednisone taper which provided some relief of symptoms, however, she reports the symptoms have returned and are more severe than previously.  She continues [employer] as needed.  She does report that she had a low-grade fever, sweats, and chills about 2 days ago.  She reports several negative tests for herself and everyone in her home.  She does have several young children, to avoid, or exhibiting symptoms including cough.  Allergic rhinitis is reported as poorly controlled with symptoms including thick nasal drainage which ranges in color from green to clear, nasal congestion, occasional sneezing, and copious postnasal drainage with frequent throat clearing.  She continues cetirizine 10 mg once a day and has used Afrin over the last 2 days for severe nasal congestion.  She is  not currently using Flonase or nasal saline rinses.  She is currently taking doxycycline for acute sinusitis with no relief of symptoms.  Reflux is reported as well-controlled with no symptoms including heartburn or vomiting omeprazole daily.  She has not been taking omeprazole 30 minutes before a meal, however, taking this medication at nighttime before bed.  Her current medications are listed in the chart.  Drug Allergies:  Allergies  Allergen Reactions   Other Cough    Capers causes SOB Cats 4+    Physical Exam: BP 118/80   Pulse 98   Temp 98.7 F (37.1 C) (Temporal)   Resp 12   Ht 5\' 6"  (1.676 m)   Wt 203 lb (92.1 kg)   SpO2 98%   BMI 32.77 kg/m    Physical Exam Vitals reviewed.  Constitutional:      Appearance: Normal appearance.  HENT:     Head: Normocephalic and atraumatic.     Right Ear: Tympanic membrane normal.     Left Ear: Tympanic membrane normal.     Nose:     Comments: Bilateral nares edematous and pale with thick clear nasal drainage noted.  Pharynx erythematous with no exudate.  Ears normal.  Eyes normal. Eyes:     Conjunctiva/sclera: Conjunctivae normal.  Cardiovascular:     Rate and Rhythm: Normal rate and regular rhythm.     Heart sounds: Normal heart sounds. No murmur heard. Pulmonary:     Effort: Pulmonary effort is normal.     Breath sounds: Normal breath sounds.     Comments: Bilateral expiratory wheeze.  No rhonchi.  Patient with rattly productive sounding cough during the interview and examination. Musculoskeletal:        General: Normal range of motion.     Cervical back: Normal range of motion and neck supple.  Skin:    General: Skin is warm and dry.  Neurological:     Mental Status: She is alert and oriented to person, place, and time.  Psychiatric:        Mood and Affect: Mood normal.        Behavior: Behavior normal.        Thought Content: Thought content normal.        Judgment: Judgment normal.     Diagnostics: Spirometry  deferred due to recent fever and active cough.  Assessment and Plan: 1. Severe persistent asthma with acute exacerbation   2. Seasonal and perennial allergic rhinitis   3. Acute recurrent maxillary sinusitis   4. Recurrent infections   5. Gastroesophageal reflux disease, unspecified whether esophagitis present   6. Viral illness     Meds ordered this encounter  Medications   predniSONE (DELTASONE) 10 MG tablet    Sig: Begin prednisone 10 mg tablets. Take 2 tablets twice a day for 3 days, then take 2 tablets once a day for 1 day, then take 1 tablet on the 5th day, then stop    Dispense:  15 tablet    Refill:  0   omeprazole (PRILOSEC) 40 MG capsule    Sig: Take 1 capsule (40 mg total) by mouth daily.    Dispense:  30 capsule    Refill:  5   Fluticasone-Umeclidin-Vilant (TRELEGY ELLIPTA) 200-62.5-25 MCG/ACT AEPB    Sig: Inhale 1 puff into the lungs daily.    Dispense:  1 each    Refill:  5    Patient Instructions  Asthma Denies chest x-ray.  We will call you with the results when they become available.   Begin Trelegy 200-1 puff once a day to prevent cough or wheeze.  Pretreat with albuterol before using Trelegy until cough and wheeze free.  Then use albuterol only as needed.  Trelegy will replace Breo for now Continue albuterol 2 puffs once every 4 hours as needed for cough or wheeze You may use albuterol 2 puffs 5 to 15 minutes before activity to decrease cough or wheeze We will submit to your insurance for a biologic medication to control your asthma.  You will next hear from our coordinator, Tammy, with next steps Begin prednisone 10 mg tablets. Take 2 tablets twice a day for 3 days, then take 2 tablets once a day for 1 day, then take 1 tablet on the 5th day, then stop   Acute sinusitis  Continue doxycycline as previously prescribed  Allergic rhinitis Continue allergen avoidance measures directed toward pollens, mold, dust mite, and pets Continue an antihistamine once a day  as needed for runny nose or itch. Remember to rotate to a different antihistamine about every 3 months. Some examples of over the counter antihistamines include Zyrtec (cetirizine), Xyzal (levocetirizine), Allegra (fexofenadine), and Claritin (loratidine).  Begin Flonase 2 sprays in each nostril once a day for nasal congestion Consider saline nasal rinses as needed for nasal symptoms. Use this before any medicated nasal sprays for best result For thick postnasal drainage, begin Mucinex 600 to 1200 mg twice a day Consider allergen immunotherapy if your symptoms are not well-controlled with the treatment plan as listed above  Reflux Continue dietary and lifestyle modifications as listed below Continue omeprazole  once a day.  Take this medication 30 to 60 minutes before meal for best results  Viral-like illness Continue to isolate until your symptoms have resolved Consider further testing for influenza  Call the clinic if this treatment plan is not working well for you  Follow up in 2 weeks or sooner if needed.  Return in about 2 weeks (around 07/22/2022), or if symptoms worsen or fail to improve.    Thank you for the opportunity to care for this patient.  Please do not hesitate to contact me with questions.  Thermon Leyland, FNP [facility]

## 2022-07-08 NOTE — Telephone Encounter (Signed)
Talked to patient via phone. We agreed on prednisone 30 mg twice a day for the next 3 days. She will also begin Trelegy 200-1 puff once a day (sample given in the clinic). We will check in with her in the morning. Patient urged to call the on call doctor with any worsening of symptoms or go to the ED for respiratory distress.

## 2022-07-08 NOTE — Progress Notes (Signed)
Cxr with no acute findings reported to patient. Patient agrees to take prednisone 30 mg twice a day and call the clinic with worsening symptoms. She will go to the ED for respiratory distress.

## 2022-07-08 NOTE — Telephone Encounter (Signed)
Thank you :)

## 2022-07-08 NOTE — Telephone Encounter (Signed)
Message has been sent to [employer] in another thread. She saw Thurston Hole today in clinic.

## 2022-07-08 NOTE — Patient Instructions (Addendum)
Asthma Denies chest x-ray.  We will call you with the results when they become available.   Begin Trelegy 200-1 puff once a day to prevent cough or wheeze.  Pretreat with albuterol before using Trelegy until cough and wheeze free.  Then use albuterol only as needed.  Trelegy will replace Breo for now Continue albuterol 2 puffs once every 4 hours as needed for cough or wheeze You may use albuterol 2 puffs 5 to 15 minutes before activity to decrease cough or wheeze We will submit to your insurance for a biologic medication to control your asthma.  You will next hear from our coordinator, Tammy, with next steps Begin prednisone 10 mg tablets. Take 2 tablets twice a day for 3 days, then take 2 tablets once a day for 1 day, then take 1 tablet on the 5th day, then stop   Acute sinusitis  Continue doxycycline as previously prescribed  Allergic rhinitis Continue allergen avoidance measures directed toward pollens, mold, dust mite, and pets Continue an antihistamine once a day as needed for runny nose or itch. Remember to rotate to a different antihistamine about every 3 months. Some examples of over the counter antihistamines include Zyrtec (cetirizine), Xyzal (levocetirizine), Allegra (fexofenadine), and Claritin (loratidine).  Begin Flonase 2 sprays in each nostril once a day for nasal congestion Consider saline nasal rinses as needed for nasal symptoms. Use this before any medicated nasal sprays for best result For thick postnasal drainage, begin Mucinex 600 to 1200 mg twice a day Consider allergen immunotherapy if your symptoms are not well-controlled with the treatment plan as listed above  Reflux Continue dietary and lifestyle modifications as listed below Continue omeprazole once a day.  Take this medication 30 to 60 minutes before meal for best results  Viral-like illness Continue to isolate until your symptoms have resolved Consider further testing for influenza  Call the clinic if this  treatment plan is not working well for you  Follow up in 2 weeks or sooner if needed.  Reducing Pollen Exposure The American Academy of Allergy, Asthma and Immunology suggests the following steps to reduce your exposure to pollen during allergy seasons. Do not hang sheets or clothing out to dry; pollen may collect on these items. Do not mow lawns or spend time around freshly cut grass; mowing stirs up pollen. Keep windows closed at night.  Keep car windows closed while driving. Minimize morning activities outdoors, a time when pollen counts are usually at their highest. Stay indoors as much as possible when pollen counts or humidity is high and on windy days when pollen tends to remain in the air longer. Use air conditioning when possible.  Many air conditioners have filters that trap the pollen spores. Use a HEPA room air filter to remove pollen form the indoor air you breathe.  Control of Mold Allergen Mold and fungi can grow on a variety of surfaces provided certain temperature and moisture conditions exist.  Outdoor molds grow on plants, decaying vegetation and soil.  The major outdoor mold, Alternaria and Cladosporium, are found in very high numbers during hot and dry conditions.  Generally, a late Summer - Fall peak is seen for common outdoor fungal spores.  Rain will temporarily lower outdoor mold spore count, but counts rise rapidly when the rainy period ends.  The most important indoor molds are Aspergillus and Penicillium.  Dark, humid and poorly ventilated basements are ideal sites for mold growth.  The next most common sites of mold growth are the  bathroom and the kitchen.  Outdoor [employer] Use air conditioning and keep windows closed Avoid exposure to decaying vegetation. Avoid leaf raking. Avoid grain handling. Consider wearing a face mask if working in moldy areas.  Indoor Mold Control Maintain humidity below 50%. Clean washable surfaces with 5% bleach solution. Remove  sources e.g. Contaminated carpets.   Control of Dust Mite Allergen Dust mites play a major role in allergic asthma and rhinitis. They occur in environments with high humidity wherever human skin is found. Dust mites absorb humidity from the atmosphere (ie, they do not drink) and feed on organic matter (including shed human and animal skin). Dust mites are a microscopic type of insect that you cannot see with the naked eye. High levels of dust mites have been detected from mattresses, pillows, carpets, upholstered furniture, bed covers, clothes, soft toys and any woven material. The principal allergen of the dust mite is found in its feces. A gram of dust may contain 1,000 mites and 250,000 fecal particles. Mite antigen is easily measured in the air during house cleaning activities. Dust mites do not bite and do not cause harm to humans, other than by triggering allergies/asthma.  Ways to decrease your exposure to dust mites in your home:  1. Encase mattresses, box springs and pillows with a mite-impermeable barrier or cover  2. Wash sheets, blankets and drapes weekly in hot water (130 F) with detergent and dry them in a dryer on the hot setting.  3. Have the room cleaned frequently with a vacuum cleaner and a damp dust-mop. For carpeting or rugs, vacuuming with a vacuum cleaner equipped with a high-efficiency particulate air (HEPA) filter. The dust mite allergic individual should not be in a room which is being cleaned and should wait 1 hour after cleaning before going into the room.  4. Do not sleep on upholstered furniture (eg, couches).  5. If possible removing carpeting, upholstered furniture and drapery from the home is ideal. Horizontal blinds should be eliminated in the rooms where the person spends the most time (bedroom, study, television room). Washable vinyl, roller-type shades are optimal.  6. Remove all non-washable stuffed toys from the bedroom. Wash stuffed toys weekly like sheets  and blankets above.  7. Reduce indoor humidity to less than 50%. Inexpensive humidity monitors can be purchased at most hardware stores. Do not use a humidifier as can make the problem worse and are not recommended.  Control of Dog or Cat Allergen Avoidance is the best way to manage a dog or cat allergy. If you have a dog or cat and are allergic to dog or cats, consider removing the dog or cat from the home. If you have a dog or cat but don't want to find it a new home, or if your family wants a pet even though someone in the household is allergic, here are some strategies that may help keep symptoms at bay:  Keep the pet out of your bedroom and restrict it to only a few rooms. Be advised that keeping the dog or cat in only one room will not limit the allergens to that room. Don't pet, hug or kiss the dog or cat; if you do, wash your hands with soap and water. High-efficiency particulate air (HEPA) cleaners run continuously in a bedroom or living room can reduce allergen levels over time. Regular use of a high-efficiency vacuum cleaner or a central vacuum can reduce allergen levels. Giving your dog or cat a bath at least once a  week can reduce airborne allergen.

## 2022-07-09 NOTE — Telephone Encounter (Signed)
Meagan Chung called in and states she took the 60mg  of prednisone and she is still feeling wheezy and short of breath and wants to know if this is normal.  She states when she takes 60mg  she normally can breathe like a champ and this time it isn't working.  Also, she would like the extra prednisone called in to the pharmacy.  She uses [facility] on [address]

## 2022-07-09 NOTE — Telephone Encounter (Signed)
Talked with patient on the phone.  She reports that she is feeling some relief, however, not as much relief as she normally feels when she takes 60 mg of prednisone a day.  She is concerned about tapering the dose of prednisone.  Patient advised to seek assistance at urgent care or ED. patient reports that she will go to emergency department or urgent care tonight or tomorrow depending on how her breathing goes.

## 2022-07-12 ENCOUNTER — Telehealth: Payer: Self-pay | Admitting: *Deleted

## 2022-07-12 NOTE — Telephone Encounter (Signed)
Ins will require her to be on steroids for at least half the time in a 6 month timeframe to consider her steroid dependent

## 2022-07-12 NOTE — Telephone Encounter (Signed)
Ok. Thank you. She has recently been on a bundle of steroids so not expecting much for WBC counts. Taper dose of prednisone on 06/09/22, 06/26/22, and 07/09/22. Any chance we could get a steroid sparing dx instead of absolute eos?

## 2022-07-12 NOTE — Telephone Encounter (Signed)
Will require CBC w diff off steroids to get on another biologic therapy  Ambs, Norvel Richards, FNP  Devoria Glassing, CMA Can you please submit for a biologic medication for this patient. She was on Fasenra with her last injection about 1 year ago with well controlled symptoms. She reports that she stopped [place] due to insurance not covering. Would be great if she could get Dupixent or Nucala. Also-She is getting married in a month or two and her insurance will change to [facility] at that time. Thank you so much

## 2022-07-12 NOTE — Telephone Encounter (Signed)
Ok. Thank you.

## 2022-07-15 ENCOUNTER — Encounter: Payer: Self-pay | Admitting: Family Medicine

## 2022-07-19 ENCOUNTER — Telehealth: Payer: BC Managed Care – PPO | Admitting: Physician Assistant

## 2022-07-19 DIAGNOSIS — N3000 Acute cystitis without hematuria: Secondary | ICD-10-CM | POA: Diagnosis not present

## 2022-07-19 MED ORDER — NITROFURANTOIN MONOHYD MACRO 100 MG PO CAPS: 100.0000 mg | ORAL_CAPSULE | Freq: Two times a day (BID) | ORAL | 0 refills | Status: AC

## 2022-07-19 NOTE — Progress Notes (Signed)
E-Visit for Urinary Problems  We are sorry that you are not feeling well.  Here is how we plan to help!  Based on what you shared with me it looks like you most likely have a simple urinary tract infection.  A UTI (Urinary Tract Infection) is a bacterial infection of the bladder.  Most cases of urinary tract infections are simple to treat but a key part of your care is to encourage you to drink plenty of fluids and watch your symptoms carefully.  I have prescribed MacroBid 100 mg twice a day for 5 days.  Your symptoms should gradually improve. Call us if the burning in your urine worsens, you develop worsening fever, back pain or pelvic pain or if your symptoms do not resolve after completing the antibiotic.  Urinary tract infections can be prevented by drinking plenty of water to keep your body hydrated.  Also be sure when you wipe, wipe from front to back and don't hold it in!  If possible, empty your bladder every 4 hours.  HOME CARE Drink plenty of fluids Compete the full course of the antibiotics even if the symptoms resolve Remember, when you need to go.go. Holding in your urine can increase the likelihood of getting a UTI! GET HELP RIGHT AWAY IF: You cannot urinate You get a high fever Worsening back pain occurs You see blood in your urine You feel sick to your stomach or throw up You feel like you are going to pass out  MAKE SURE YOU  Understand these instructions. Will watch your condition. Will get help right away if you are not doing well or get worse.   Thank you for choosing an e-visit.  Your e-visit answers were reviewed by a board certified advanced clinical practitioner to complete your personal care plan. Depending upon the condition, your plan could have included both over the counter or prescription medications.  Please review your pharmacy choice. Make sure the pharmacy is open so you can pick up prescription now. If there is a problem, you may contact your  provider through MyChart messaging and have the prescription routed to another pharmacy.  Your safety is important to us. If you have drug allergies check your prescription carefully.   For the next 24 hours you can use MyChart to ask questions about today's visit, request a non-urgent call back, or ask for a work or school excuse. You will get an email in the next two days asking about your experience. I hope that your e-visit has been valuable and will speed your recovery.   I have spent 5 minutes in review of e-visit questionnaire, review and updating patient chart, medical decision making and response to patient.   Embry Huss Z Ward, PA-C    

## 2022-07-21 ENCOUNTER — Telehealth: Payer: Self-pay | Admitting: *Deleted

## 2022-07-21 NOTE — Patient Instructions (Incomplete)
Asthma Continue Trelegy 200-1 puff once a day to prevent cough or wheeze.  Pretreat with albuterol before using Trelegy until cough and wheeze free.  Then use albuterol only as needed.  Trelegy will replace Breo for now Continue albuterol 2 puffs once every 4 hours as needed for cough or wheeze You may use albuterol 2 puffs 5 to 15 minutes before activity to decrease cough or wheeze We will submit to your insurance for a biologic medication to control your asthma.  You will next hear from our coordinator, Tammy, with next steps Begin prednisone 10 mg tablets. Take 2 tablets twice a day for 3 days, then take 2 tablets once a day for 1 day, then take 1 tablet on the 5th day, then stop   Acute sinusitis  Continue doxycycline as previously prescribed  Allergic rhinitis Continue allergen avoidance measures directed toward pollens, mold, dust mite, and pets Continue an antihistamine once a day as needed for runny nose or itch. Remember to rotate to a different antihistamine about every 3 months. Some examples of over the counter antihistamines include Zyrtec (cetirizine), Xyzal (levocetirizine), Allegra (fexofenadine), and Claritin (loratidine).  Begin Flonase 2 sprays in each nostril once a day for nasal congestion Consider saline nasal rinses as needed for nasal symptoms. Use this before any medicated nasal sprays for best result For thick postnasal drainage, begin Mucinex 600 to 1200 mg twice a day Consider allergen immunotherapy if your symptoms are not well-controlled with the treatment plan as listed above  Reflux Continue dietary and lifestyle modifications as listed below Continue omeprazole once a day.  Take this medication 30 to 60 minutes before meal for best results  Call the clinic if this treatment plan is not working well for you  Follow up in 2 weeks or sooner if needed.  Reducing Pollen Exposure The American Academy of Allergy, Asthma and Immunology suggests the following steps  to reduce your exposure to pollen during allergy seasons. Do not hang sheets or clothing out to dry; pollen may collect on these items. Do not mow lawns or spend time around freshly cut grass; mowing stirs up pollen. Keep windows closed at night.  Keep car windows closed while driving. Minimize morning activities outdoors, a time when pollen counts are usually at their highest. Stay indoors as much as possible when pollen counts or humidity is high and on windy days when pollen tends to remain in the air longer. Use air conditioning when possible.  Many air conditioners have filters that trap the pollen spores. Use a HEPA room air filter to remove pollen form the indoor air you breathe.  Control of Mold Allergen Mold and fungi can grow on a variety of surfaces provided certain temperature and moisture conditions exist.  Outdoor molds grow on plants, decaying vegetation and soil.  The major outdoor mold, Alternaria and Cladosporium, are found in very high numbers during hot and dry conditions.  Generally, a late Summer - Fall peak is seen for common outdoor fungal spores.  Rain will temporarily lower outdoor mold spore count, but counts rise rapidly when the rainy period ends.  The most important indoor molds are Aspergillus and Penicillium.  Dark, humid and poorly ventilated basements are ideal sites for mold growth.  The next most common sites of mold growth are the bathroom and the kitchen.  Outdoor [employer] Use air conditioning and keep windows closed Avoid exposure to decaying vegetation. Avoid leaf raking. Avoid grain handling. Consider wearing a face mask if working  in moldy areas.  Indoor Mold Control Maintain humidity below 50%. Clean washable surfaces with 5% bleach solution. Remove sources e.g. Contaminated carpets.   Control of Dust Mite Allergen Dust mites play a major role in allergic asthma and rhinitis. They occur in environments with high humidity wherever human skin is  found. Dust mites absorb humidity from the atmosphere (ie, they do not drink) and feed on organic matter (including shed human and animal skin). Dust mites are a microscopic type of insect that you cannot see with the naked eye. High levels of dust mites have been detected from mattresses, pillows, carpets, upholstered furniture, bed covers, clothes, soft toys and any woven material. The principal allergen of the dust mite is found in its feces. A gram of dust may contain 1,000 mites and 250,000 fecal particles. Mite antigen is easily measured in the air during house cleaning activities. Dust mites do not bite and do not cause harm to humans, other than by triggering allergies/asthma.  Ways to decrease your exposure to dust mites in your home:  1. Encase mattresses, box springs and pillows with a mite-impermeable barrier or cover  2. Wash sheets, blankets and drapes weekly in hot water (130 F) with detergent and dry them in a dryer on the hot setting.  3. Have the room cleaned frequently with a vacuum cleaner and a damp dust-mop. For carpeting or rugs, vacuuming with a vacuum cleaner equipped with a high-efficiency particulate air (HEPA) filter. The dust mite allergic individual should not be in a room which is being cleaned and should wait 1 hour after cleaning before going into the room.  4. Do not sleep on upholstered furniture (eg, couches).  5. If possible removing carpeting, upholstered furniture and drapery from the home is ideal. Horizontal blinds should be eliminated in the rooms where the person spends the most time (bedroom, study, television room). Washable vinyl, roller-type shades are optimal.  6. Remove all non-washable stuffed toys from the bedroom. Wash stuffed toys weekly like sheets and blankets above.  7. Reduce indoor humidity to less than 50%. Inexpensive humidity monitors can be purchased at most hardware stores. Do not use a humidifier as can make the problem worse and are  not recommended.  Control of Dog or Cat Allergen Avoidance is the best way to manage a dog or cat allergy. If you have a dog or cat and are allergic to dog or cats, consider removing the dog or cat from the home. If you have a dog or cat but don't want to find it a new home, or if your family wants a pet even though someone in the household is allergic, here are some strategies that may help keep symptoms at bay:  Keep the pet out of your bedroom and restrict it to only a few rooms. Be advised that keeping the dog or cat in only one room will not limit the allergens to that room. Don't pet, hug or kiss the dog or cat; if you do, wash your hands with soap and water. High-efficiency particulate air (HEPA) cleaners run continuously in a bedroom or living room can reduce allergen levels over time. Regular use of a high-efficiency vacuum cleaner or a central vacuum can reduce allergen levels. Giving your dog or cat a bath at least once a week can reduce airborne allergen.

## 2022-07-21 NOTE — Progress Notes (Signed)
[address] [place] [address] Dept: [phone]  FOLLOW UP NOTE  Patient ID: Meagan Chung, female    DOB: [birth year]  Age: 37 y.o. MRN: [id number] Date of Office Visit: 07/22/2022  Assessment  Chief Complaint: Asthma (No issues ) and Other [facility] Ch[facility]enger restart )  HPI Meagan Chung is a 37 year old female who presents to the clinic for follow-up visit.  She was last seen in this clinic on 07/08/2022 by Thermon Leyland, FNP, for evaluation of asthma with acute exacerbation, allergic rhinitis, acute sinusitis requiring doxycycline, reflux, and recurrent infection.  At that time she required a prednisone taper as well as an antibiotic for resolution of symptoms.  In the interim, she also visited her primary care provider on 07/15/2022 for persistent cough where she had a normal chest x-ray and received azithromycin with relief of symptoms.  At today's visit, she reports her asthma has been much more well-controlled than at her last visit with symptoms including intermittent cough producing clear mucus.  At this time, she denies shortness of breath or wheeze with activity or rest.  She continues montelukast 10 mg once a day, Trelegy 200-1 puff once a day, and has not needed to use albuterol since her last visit to this clinic.  Allergic rhinitis is reported as moderately well-controlled with symptoms including clear rhinorrhea and postnasal drainage.  She continues an antihistamine and Flonase as needed with relief of symptoms.  Her last environmental allergy testing was on 04/08/2017 and was positive to grass pollen, tree pollen, cat hair, mold mix 2, dust mite, and dog.  Reflux is reported as moderately well-controlled with occasional heartburn for which she continues omeprazole once a day.  Her current medications are listed in the chart.  Drug Allergies:  Allergies  Allergen Reactions   Other Cough    Capers causes SOB Cats 4+    Physical Exam: BP 122/74   Pulse 87   Temp  98.4 F (36.9 C)   Resp 18   Ht 5\' 6"  (1.676 m)   Wt 196 lb 8 oz (89.1 kg)   SpO2 99%   BMI 31.72 kg/m    Physical Exam Vitals reviewed.  Constitutional:      Appearance: Normal appearance.  HENT:     Head: Normocephalic and atraumatic.     Right Ear: Tympanic membrane normal.     Left Ear: Tympanic membrane normal.     Nose:     Comments: Bilateral naris slightly erythematous with clear nasal drainage noted.  Pharynx normal.  Ears normal.  Eyes normal.    Mouth/Throat:     Pharynx: Oropharynx is clear.  Eyes:     Conjunctiva/sclera: Conjunctivae normal.  Cardiovascular:     Rate and Rhythm: Normal rate and regular rhythm.     Heart sounds: Normal heart sounds. No murmur heard. Pulmonary:     Effort: Pulmonary effort is normal.     Breath sounds: Normal breath sounds.     Comments: Lungs clear to auscultation Musculoskeletal:        General: Normal range of motion.     Cervical back: Normal range of motion and neck supple.  Skin:    General: Skin is warm and dry.  Neurological:     Mental Status: She is alert and oriented to person, place, and time.  Psychiatric:        Mood and Affect: Mood normal.        Behavior: Behavior normal.  Thought Content: Thought content normal.        Judgment: Judgment normal.     Assessment and Plan: 1. Severe persistent asthma without complication   2. Seasonal and perennial allergic rhinitis   3. Gastroesophageal reflux disease, unspecified whether esophagitis present     Patient Instructions  Asthma Continue Trelegy 200-1 puff once a day to prevent cough or wheeze.   Continue montelukast 10 mg once a day to prevent cough or wheeze Continue albuterol 2 puffs once every 4 hours as needed for cough or wheeze You may use albuterol 2 puffs 5 to 15 minutes before activity to decrease cough or wheeze An order has been placed to help [place] evaluate your asthma.  We will call you when the results become available.  If blood work  indicates eosinophilic asthma, will submit to your insurance for a biologic medication to control your asthma.  You will next hear from our coordinator, Tammy, with next steps  Allergic rhinitis Continue allergen avoidance measures directed toward pollens, mold, dust mite, and pets Continue an antihistamine once a day as needed for runny nose or itch. Remember to rotate to a different antihistamine about every 3 months. Some examples of over the counter antihistamines include Zyrtec (cetirizine), Xyzal (levocetirizine), Allegra (fexofenadine), and Claritin (loratidine).  Begin Flonase 2 sprays in each nostril once a day for nasal congestion Consider saline nasal rinses as needed for nasal symptoms. Use this before any medicated nasal sprays for best result For thick postnasal drainage, begin Mucinex 600 to 1200 mg twice a day Consider allergen immunotherapy if your symptoms are not well-controlled with the treatment plan as listed above  Reflux Continue dietary and lifestyle modifications as listed below Continue omeprazole once a day.  Take this medication 30 to 60 minutes before meal for best results  Call the clinic if this treatment plan is not working well for you  Follow up in 2 months or sooner if needed.   Return in about 2 months (around 09/21/2022), or if symptoms worsen or fail to improve.    Thank you for the opportunity to care for this patient.  Please do not hesitate to contact me with questions.  Thermon Leyland, FNP [facility]

## 2022-07-21 NOTE — Telephone Encounter (Signed)
-----   Message from Hetty Blend, FNP sent at 07/21/2022  7:43 AM EDT ----- Can you please let this patient know that we will need to be drawing labs tomorrow in order to see if she qualifies for a biologic therapy.  We can do this before or after her appointment tomorrow.  Thank you

## 2022-07-21 NOTE — Telephone Encounter (Signed)
Called and left a voicemail asking for patient to return call to inform.  

## 2022-07-22 ENCOUNTER — Encounter: Payer: Self-pay | Admitting: Family Medicine

## 2022-07-22 ENCOUNTER — Ambulatory Visit: Payer: BC Managed Care – PPO | Admitting: Family Medicine

## 2022-07-22 VITALS — BP 122/74 | HR 87 | Temp 98.4°F | Resp 18 | Ht 66.0 in | Wt 196.5 lb

## 2022-07-22 DIAGNOSIS — J4550 Severe persistent asthma, uncomplicated: Secondary | ICD-10-CM

## 2022-07-22 DIAGNOSIS — J3089 Other allergic rhinitis: Secondary | ICD-10-CM

## 2022-07-22 DIAGNOSIS — K219 Gastro-esophageal reflux disease without esophagitis: Secondary | ICD-10-CM

## 2022-07-22 DIAGNOSIS — J302 Other seasonal allergic rhinitis: Secondary | ICD-10-CM | POA: Diagnosis not present

## 2022-07-23 LAB — CBC WITH DIFFERENTIAL/PLATELET
Basophils Absolute: 0.1 10*3/uL (ref 0.0–0.2)
EOS (ABSOLUTE): 0.2 10*3/uL (ref 0.0–0.4)
Hematocrit: 42 % (ref 34.0–46.6)
Neutrophils: 53 %
RBC: 4.74 x10E6/uL (ref 3.77–5.28)

## 2022-07-23 LAB — IGE+ALLERGENS ZONE 3(27)

## 2022-07-23 NOTE — Telephone Encounter (Signed)
Patient came in yesterday for her appointment and had labs drawn.

## 2022-07-25 LAB — IGE+ALLERGENS ZONE 3(27)
Alternaria Alternata IgE: <0.1 kU/L
Aspergillus Fumigatus IgE: <0.1 kU/L
Bahia Grass IgE: <0.1 kU/L
Bermuda Grass IgE: <0.1 kU/L
Cat Dander IgE: 0.62 kU/L — AB
Cedar, Mountain IgE: <0.1 kU/L
Cladosporium Herbarum IgE: <0.1 kU/L
Common Silver Birch IgE: <0.1 kU/L
D Pteronyssinus IgE: <0.1 kU/L
Dog Dander IgE: <0.1 kU/L
Elm, American IgE: <0.1 kU/L
Hickory, White IgE: <0.1 kU/L
Johnson Grass IgE: <0.1 kU/L
Maple/Box Elder IgE: <0.1 kU/L
Nettle IgE: <0.1 kU/L
Oak, White IgE: <0.1 kU/L
Penicillium Chrysogen IgE: <0.1 kU/L
Plantain, English IgE: <0.1 kU/L
Ragweed, Short IgE: <0.1 kU/L
Stemphylium Herbarum IgE: <0.1 kU/L
White Mulberry IgE: <0.1 kU/L

## 2022-07-25 LAB — CBC WITH DIFFERENTIAL/PLATELET
Basos: 1 %
Eos: 3 %
Hemoglobin: 13.7 g/dL (ref 11.1–15.9)
Immature Grans (Abs): 0 10*3/uL (ref 0.0–0.1)
Immature Granulocytes: 0 %
Lymphocytes Absolute: 2.5 10*3/uL (ref 0.7–3.1)
Lymphs: 35 %
MCH: 28.9 pg (ref 26.6–33.0)
MCHC: 32.6 g/dL (ref 31.5–35.7)
MCV: 89 fL (ref 79–97)
Monocytes Absolute: 0.5 10*3/uL (ref 0.1–0.9)
Monocytes: 8 %
Neutrophils Absolute: 3.8 10*3/uL (ref 1.4–7.0)
Platelets: 271 10*3/uL (ref 150–450)
RDW: 12.6 % (ref 11.7–15.4)
WBC: 7.1 10*3/uL (ref 3.4–10.8)

## 2022-07-27 NOTE — Progress Notes (Signed)
Can you please let this patient know that her environmental allergy testing came bacj positive to cat.  Please send out allergen avoidance measures directed toward cat.  Please let her know that her absolute eosinophil count was 200 which is enough to qualify for a biologic therapy.  We can resubmit this information to her insurance if she wants to move forward with this please let me know.  Thank you

## 2022-07-28 ENCOUNTER — Telehealth: Payer: Self-pay | Admitting: *Deleted

## 2022-07-28 NOTE — Telephone Encounter (Signed)
-----   Message from Hetty Blend, FNP sent at 07/27/2022  1:58 PM EDT ----- Hi [name], Sorry if this chart has already been routed to you. This patient completed her lab work and is interested in moving forward with [employer]. She is getting married within the next few months and will then have a change to her husband's insurance. Thank you

## 2022-07-28 NOTE — Telephone Encounter (Signed)
L/m for patient to contact me to advise approval and submit to restart [facility] Ch[facility]enger to [employer]

## 2022-08-04 NOTE — Telephone Encounter (Signed)
L/m for patient again to contact me ?

## 2022-08-09 ENCOUNTER — Encounter: Payer: Self-pay | Admitting: *Deleted

## 2022-08-09 NOTE — Telephone Encounter (Signed)
Mychart message sent.

## 2022-08-19 NOTE — Telephone Encounter (Signed)
Patient never responded to messages.

## 2022-08-20 NOTE — Telephone Encounter (Signed)
Interesting.  Thank you.

## 2022-08-25 NOTE — Telephone Encounter (Signed)
Patient called back in and advised new Ins and will need to get approval with the new Ins in order to process. Called patient and  L/M to advise approval copay card and submit to [facility]

## 2022-09-07 NOTE — Telephone Encounter (Signed)
L/m for patient again to reach out to me so I can submit her fasenra rx to [facility]

## 2022-09-16 ENCOUNTER — Encounter: Payer: Self-pay | Admitting: Family Medicine

## 2022-09-20 ENCOUNTER — Other Ambulatory Visit: Payer: Self-pay | Admitting: *Deleted

## 2022-09-20 MED ORDER — EPINEPHRINE 0.3 MG/0.3ML IJ SOAJ: 0.3000 mg | INTRAMUSCULAR | 1 refills | Status: AC | PRN

## 2022-10-08 NOTE — Telephone Encounter (Signed)
Pcp removed

## 2022-11-02 ENCOUNTER — Ambulatory Visit: Payer: Self-pay | Admitting: Allergy & Immunology

## 2022-11-18 ENCOUNTER — Ambulatory Visit: Payer: Commercial Managed Care - PPO | Admitting: Allergy & Immunology

## 2022-11-18 ENCOUNTER — Encounter: Payer: Self-pay | Admitting: Allergy & Immunology

## 2022-11-18 ENCOUNTER — Other Ambulatory Visit: Payer: Self-pay

## 2022-11-18 VITALS — BP 122/76 | HR 88 | Temp 98.0°F | Resp 18 | Ht 66.0 in | Wt 166.9 lb

## 2022-11-18 DIAGNOSIS — B999 Unspecified infectious disease: Secondary | ICD-10-CM

## 2022-11-18 DIAGNOSIS — J3089 Other allergic rhinitis: Secondary | ICD-10-CM | POA: Diagnosis not present

## 2022-11-18 DIAGNOSIS — J302 Other seasonal allergic rhinitis: Secondary | ICD-10-CM

## 2022-11-18 DIAGNOSIS — K219 Gastro-esophageal reflux disease without esophagitis: Secondary | ICD-10-CM | POA: Diagnosis not present

## 2022-11-18 DIAGNOSIS — J4550 Severe persistent asthma, uncomplicated: Secondary | ICD-10-CM | POA: Diagnosis not present

## 2022-11-18 MED ORDER — TRELEGY ELLIPTA 200-62.5-25 MCG/ACT IN AEPB: 1.0000 | INHALATION_SPRAY | Freq: Every day | RESPIRATORY_TRACT | 11 refills | Status: AC

## 2022-11-18 MED ORDER — ALBUTEROL SULFATE HFA 108 (90 BASE) MCG/ACT IN AERS: 2.0000 | INHALATION_SPRAY | RESPIRATORY_TRACT | 1 refills | Status: DC | PRN

## 2022-11-18 NOTE — Addendum Note (Signed)
Addended by: Philipp Deputy on: 11/18/2022 04:54 PM   Modules accepted: Orders

## 2022-11-18 NOTE — Patient Instructions (Addendum)
1. Severe persistent asthma without complication - Lung function looked great today. - You are amazing on the [place]. - Daily controller medication(s): Fasenra every 8 weeks + Trelegy one puff once daily - Prior to physical activity: albuterol 2 puffs 10-15 minutes before physical activity. - Rescue medications: albuterol 4 puffs every 4-6 hours as needed - Asthma control goals:  * Full participation in all desired activities (may need albuterol before activity) * Albuterol use two time or less a week on average (not counting use with activity) * Cough interfering with sleep two time or less a month * Oral steroids no more than once a year * No hospitalizations   2. Perennial and seasonal allergic rhinitis (trees, weeds, grasses, indoor molds, dust mites, cat and dog) - Continue with taking: Zyrtec (cetirizine) 10mg  tablet once daily as needed  3. GERD  - Try stopping the Prilosec and see what happens. - There is a small connection between decreased bone mineralization and PPI use. - We could consider adding Pepcid (famotidine) instead if needed. - Just send a MyChart.   4. Return in about 1 year (around 11/18/2023).    Please inform us of any Emergency Department visits, hospitalizations, or changes in symptoms. Call us before going to the ED for breathing or allergy symptoms since we might be able to fit you in for a sick visit. Feel free to contact us anytime with any questions, problems, or concerns.  It was a pleasure to see you again today!  Websites that have reliable patient information: 1. American Academy of Asthma, Allergy, and Immunology: www.aaaai.org 2. Food Allergy Research and Education (FARE): foodallergy.org 3. Mothers of Asthmatics: http://www.asthmacommunitynetwork.org 4. American College of Allergy, Asthma, and Immunology: www.acaai.org   COVID-19 Vaccine Information can be found at:  [email or web address] For questions related to vaccine distribution or appointments, please email [email or web address] or call [phone].   We realize that you might be concerned about having an allergic reaction to the COVID19 vaccines. To help with that concern, WE ARE OFFERING THE COVID19 VACCINES IN OUR OFFICE! Ask the front desk for dates!     "Like" [place] on Facebook and Instagram for our latest updates!      A healthy democracy works best when [employer] participate! Make sure you are registered to vote! If you have moved or changed any of your contact information, you will need to get this updated before voting!  In some cases, you MAY be able to register to vote online: [email or web address]

## 2022-11-18 NOTE — Progress Notes (Signed)
FOLLOW UP  Date of Service/Encounter:  11/18/22   Assessment:   Severe persistent asthma, uncomplicated - doing much better on Fasenra   Recurrent infections - with normal screening in 2019 and 2022 and 2023   History of turbinate reduction surgery by Dr. Leland Johns (ENT)   Seasonal and perennial allergic rhinitis (trees, weeds, grasses, indoor molds, dust mites, cat and dog)  Plan/Recommendations:   1. Severe persistent asthma without complication - Lung function looked great today. - You are amazing on the [place]. - Daily controller medication(s): Fasenra every 8 weeks + Trelegy one puff once daily - Prior to physical activity: albuterol 2 puffs 10-15 minutes before physical activity. - Rescue medications: albuterol 4 puffs every 4-6 hours as needed - Asthma control goals:  * Full participation in all desired activities (may need albuterol before activity) * Albuterol use two time or less a week on average (not counting use with activity) * Cough interfering with sleep two time or less a month * Oral steroids no more than once a year * No hospitalizations   2. Perennial and seasonal allergic rhinitis (trees, weeds, grasses, indoor molds, dust mites, cat and dog) - Continue with taking: Zyrtec (cetirizine) 10mg  tablet once daily as needed  3. GERD  - Try stopping the Prilosec and see what happens. - There is a small connection between decreased bone mineralization and PPI use. - We could consider adding Pepcid (famotidine) instead if needed. - Just send a MyChart.   4. Return in about 1 year (around 11/18/2023).    Subjective:   Meagan Chung is a 37 y.o. female presenting today for follow up of  Chief Complaint  Patient presents with   Asthma    No issues      Other    Har[facility] Challenger renewal     Ivorie Albro has a history of the following: Patient Active Problem List   Diagnosis Date Noted   Viral illness 07/08/2022   Strep pharyngitis  02/03/2021   Subcutaneous nodule of head 01/13/2021   COVID-19 11/05/2020   Preventative health care 06/25/2019   Acute sinusitis 03/08/2019   Severe persistent asthma 05/15/2018   Recurrent infections 04/08/2017   Seasonal and perennial allergic rhinitis 04/08/2017   Chronic asthma without complication 11/28/2014   Anxiety and depression    Thyroid nodule    Gastroesophageal reflux disease    Hypokalemia    Irritable bowel syndrome 12/19/2012   Insomnia 12/19/2012   History of bulimia 12/19/2012   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  Netasha is a 37 y.o. female presenting for a follow up visit.  She was last seen in May 2024 by Thermon Leyland, one of our nurse practitioners.  At that time, we continue with Trelegy 200 mcg 1 puff daily as well as Singulair and albuterol as needed.  We did get another CBC that showed elevated eosinophils.  We worked on Probation officer approved again.  For her rhinitis, we continue with alternating antihistamines and started Flonase.  We also recommended Mucinex 600 to 1200 mg twice a day.  For reflux, she continue with omeprazole once a day.  Since last visit, she has done well.  Asthma/Respiratory Symptom History: She remains on Trelegy . She changed insurances to [facility]. She is now on [place]. Injections are going well.  She previously had a high deductible plan that did not provide continuous coverage of  the [place].  She has not been using her albuterol at all.  She has not been on prednisone since last year.  She has not been to the hospital.  She has not been to the emergency room.  Allergic Rhinitis Symptom History: She remains on the cetirizine. She does not use a nose spray.  She does not take the montelukast.  Her allergic rhinitis is under good control.  GERD Symptom History: She remains on the Prilosec.  She has been on this for a while.  It was felt at 1  point that this was contributing to her shortness of breath, which is why she is on it.  She never had traditional symptoms of reflux.  She is still getting sinus infections.  She reports that she has had 2 ear infections in the last year.  She also had maybe 1 sinus infection.  At 1 point she was having 10 sinus infections a year.  She still loves her job.  She is living in [place], [place].  Her husband had 4 children so they have 5 children now and they are adorable blended family.  They have the arrangement that they each have the kids 50% of the time, so they are essentially childless for 2 weeks each month.  Otherwise, there have been no changes to her past medical history, surgical history, family history, or social history.    Review of systems otherwise negative other than that mentioned in the HPI.    Objective:   Blood pressure 122/76, pulse 88, temperature 98 F (36.7 C), resp. rate 18, height 5\' 6"  (1.676 m), weight 166 lb 14.4 oz (75.7 kg), SpO2 98%. Body mass index is 26.94 kg/m.    Physical Exam Vitals reviewed.  Constitutional:      Appearance: She is well-developed.  HENT:     Head: Normocephalic and atraumatic.     Right Ear: Tympanic membrane, ear canal and external ear normal.     Left Ear: Tympanic membrane, ear canal and external ear normal.     Nose: No nasal deformity, septal deviation, mucosal edema or rhinorrhea.     Right Turbinates: Enlarged, swollen and pale.     Left Turbinates: Enlarged, swollen and pale.     Right Sinus: No maxillary sinus tenderness or frontal sinus tenderness.     Left Sinus: No maxillary sinus tenderness or frontal sinus tenderness.     Mouth/Throat:     Lips: Pink.     Mouth: Mucous membranes are moist. Mucous membranes are not pale and not dry.     Pharynx: Uvula midline.     Comments: Cobblestoning in the posterior oropharynx. Eyes:     General: Lids are normal. No allergic shiner.       Right eye: No  discharge.        Left eye: No discharge.     Conjunctiva/sclera: Conjunctivae normal.     Right eye: Right conjunctiva is not injected. No chemosis.    Left eye: Left conjunctiva is not injected. No chemosis.    Pupils: Pupils are equal, round, and reactive to light.  Cardiovascular:     Rate and Rhythm: Normal rate and regular rhythm.     Heart sounds: Normal heart sounds.  Pulmonary:     Effort: Pulmonary effort is normal. No tachypnea, accessory muscle usage or respiratory distress.     Breath sounds: Normal breath sounds. No wheezing, rhonchi or rales.  Chest:     Chest wall: No tenderness.  Lymphadenopathy:     Cervical: No cervical adenopathy.  Skin:    Coloration: Skin is not pale.     Findings: No abrasion, erythema, petechiae or rash. Rash is not papular, urticarial or vesicular.  Neurological:     Mental Status: She is alert.  Psychiatric:        Behavior: Behavior is cooperative.      Diagnostic studies:    Spirometry: results normal (FEV1: 4.07/122%, FVC: 5.17/129%, FEV1/FVC: 79%).    Spirometry consistent with normal pattern.   Allergy Studies: none        Malachi Bonds, MD  [facility]

## 2023-05-02 ENCOUNTER — Other Ambulatory Visit: Payer: Self-pay | Admitting: Allergy & Immunology

## 2023-07-12 ENCOUNTER — Encounter: Payer: Self-pay | Admitting: Allergy & Immunology

## 2023-07-19 ENCOUNTER — Telehealth: Payer: Self-pay | Admitting: *Deleted

## 2023-07-19 NOTE — Telephone Encounter (Signed)
 T/c to patient to discuss ins coverage for fasenra 

## 2023-07-19 NOTE — Telephone Encounter (Signed)
 L/m for patient to contact me to discuss  Ins for her Fasenra  since husband lost his [employer] coverage.

## 2023-08-15 NOTE — Telephone Encounter (Signed)
L/m for patient to contact me again ?

## 2023-11-10 ENCOUNTER — Other Ambulatory Visit: Payer: Self-pay | Admitting: Obstetrics & Gynecology

## 2023-11-10 DIAGNOSIS — Z9189 Other specified personal risk factors, not elsewhere classified: Secondary | ICD-10-CM

## 2024-01-05 ENCOUNTER — Other Ambulatory Visit: Payer: Self-pay | Admitting: Family Medicine

## 2024-03-29 ENCOUNTER — Encounter: Payer: Self-pay | Admitting: Allergy & Immunology
